# Patient Record
Sex: Male | Born: 1965 | Race: White | Hispanic: No | Smoking: Never smoker
Health system: Southern US, Community
[De-identification: ages and names within clinical notes are randomized; demographics above are authoritative.]

## PROBLEM LIST (undated history)

## (undated) DIAGNOSIS — E291 Testicular hypofunction: Secondary | ICD-10-CM

## (undated) DIAGNOSIS — Z79899 Other long term (current) drug therapy: Secondary | ICD-10-CM

## (undated) DIAGNOSIS — E785 Hyperlipidemia, unspecified: Secondary | ICD-10-CM

## (undated) DIAGNOSIS — E559 Vitamin D deficiency, unspecified: Secondary | ICD-10-CM

## (undated) DIAGNOSIS — I10 Essential (primary) hypertension: Secondary | ICD-10-CM

## (undated) DIAGNOSIS — F419 Anxiety disorder, unspecified: Secondary | ICD-10-CM

## (undated) HISTORY — DX: Anxiety disorder, unspecified: F41.9

## (undated) HISTORY — DX: Testicular hypofunction: E29.1

## (undated) HISTORY — DX: Other long term (current) drug therapy: Z79.899

## (undated) HISTORY — DX: Vitamin D deficiency, unspecified: E55.9

## (undated) HISTORY — DX: Hyperlipidemia, unspecified: E78.5

## (undated) HISTORY — PX: HERNIA REPAIR: SHX51

## (undated) HISTORY — PX: VASECTOMY: SHX75

---

## 2004-01-02 HISTORY — PX: LASIK: SHX215

## 2009-09-14 ENCOUNTER — Ambulatory Visit (HOSPITAL_COMMUNITY): Admission: RE | Admit: 2009-09-14 | Discharge: 2009-09-14 | Payer: Self-pay | Admitting: Surgery

## 2010-03-16 LAB — CBC
HCT: 48.3 % (ref 39.0–52.0)
Hemoglobin: 17 g/dL (ref 13.0–17.0)
MCHC: 35.1 g/dL (ref 30.0–36.0)
MCV: 97.8 fL (ref 78.0–100.0)

## 2010-03-16 LAB — COMPREHENSIVE METABOLIC PANEL
ALT: 30 U/L (ref 0–53)
Alkaline Phosphatase: 39 U/L (ref 39–117)
BUN: 7 mg/dL (ref 6–23)
CO2: 28 mEq/L (ref 19–32)
Calcium: 9.5 mg/dL (ref 8.4–10.5)
GFR calc non Af Amer: >60 mL/min (ref >60)
Glucose, Bld: 84 mg/dL (ref 70–99)
Potassium: 4.3 mEq/L (ref 3.5–5.1)
Sodium: 142 mEq/L (ref 135–145)

## 2010-03-16 LAB — DIFFERENTIAL
Basophils Relative: 0 % (ref 0–1)
Eosinophils Absolute: 0.1 10*3/uL (ref 0.0–0.7)
Neutro Abs: 2.6 10*3/uL (ref 1.7–7.7)
Neutrophils Relative %: 54 % (ref 43–77)

## 2010-03-16 LAB — PROTIME-INR
INR: 0.94 (ref 0.00–1.49)
Prothrombin Time: 12.8 seconds (ref 11.6–15.2)

## 2012-06-19 ENCOUNTER — Encounter (HOSPITAL_COMMUNITY)
Admission: EM | Disposition: A | Discharge status: Home / Self Care | Payer: Self-pay | Source: Home / Self Care | Attending: Emergency Medicine

## 2012-06-19 ENCOUNTER — Emergency Department (HOSPITAL_COMMUNITY): Payer: 59

## 2012-06-19 ENCOUNTER — Encounter (HOSPITAL_COMMUNITY): Payer: Self-pay | Admitting: Anesthesiology

## 2012-06-19 ENCOUNTER — Emergency Department (HOSPITAL_COMMUNITY): Payer: 59 | Admitting: Anesthesiology

## 2012-06-19 ENCOUNTER — Encounter (HOSPITAL_COMMUNITY): Payer: Self-pay | Admitting: Emergency Medicine

## 2012-06-19 ENCOUNTER — Emergency Department (HOSPITAL_COMMUNITY)
Admission: EM | Admit: 2012-06-19 | Discharge: 2012-06-19 | Disposition: A | Discharge status: Home / Self Care | Payer: 59 | Attending: Emergency Medicine | Admitting: Emergency Medicine

## 2012-06-19 DIAGNOSIS — S51809A Unspecified open wound of unspecified forearm, initial encounter: Secondary | ICD-10-CM | POA: Diagnosis not present

## 2012-06-19 DIAGNOSIS — W540XXA Bitten by dog, initial encounter: Secondary | ICD-10-CM | POA: Insufficient documentation

## 2012-06-19 DIAGNOSIS — S41151A Open bite of right upper arm, initial encounter: Secondary | ICD-10-CM

## 2012-06-19 DIAGNOSIS — I10 Essential (primary) hypertension: Secondary | ICD-10-CM | POA: Insufficient documentation

## 2012-06-19 DIAGNOSIS — L0390 Cellulitis, unspecified: Secondary | ICD-10-CM

## 2012-06-19 DIAGNOSIS — Y92009 Unspecified place in unspecified non-institutional (private) residence as the place of occurrence of the external cause: Secondary | ICD-10-CM | POA: Insufficient documentation

## 2012-06-19 HISTORY — PX: I & D EXTREMITY: SHX5045

## 2012-06-19 HISTORY — DX: Essential (primary) hypertension: I10

## 2012-06-19 LAB — BASIC METABOLIC PANEL
CO2: 28 mEq/L (ref 19–32)
Calcium: 9.1 mg/dL (ref 8.4–10.5)
Creatinine, Ser: 0.86 mg/dL (ref 0.50–1.35)

## 2012-06-19 LAB — CBC WITH DIFFERENTIAL/PLATELET
Basophils Absolute: 0 10*3/uL (ref 0.0–0.1)
Basophils Relative: 0 % (ref 0–1)
Eosinophils Absolute: 0.1 10*3/uL (ref 0.0–0.7)
Eosinophils Relative: 1 % (ref 0–5)
HCT: 40.3 % (ref 39.0–52.0)
MCH: 34.3 pg — ABNORMAL HIGH (ref 26.0–34.0)
MCHC: 36.7 g/dL — ABNORMAL HIGH (ref 30.0–36.0)
MCV: 93.3 fL (ref 78.0–100.0)
Monocytes Absolute: 0.8 10*3/uL (ref 0.1–1.0)
RDW: 12.7 % (ref 11.5–15.5)

## 2012-06-19 SURGERY — IRRIGATION AND DEBRIDEMENT EXTREMITY
Anesthesia: General | Site: Arm Lower | Laterality: Right | Wound class: Dirty or Infected

## 2012-06-19 MED ORDER — PROPOFOL 10 MG/ML IV BOLUS
INTRAVENOUS | Status: DC | PRN
  Administered 2012-06-19: 100 mg via INTRAVENOUS
  Administered 2012-06-19: 200 mg via INTRAVENOUS

## 2012-06-19 MED ORDER — SODIUM CHLORIDE 0.9 % IV SOLN
3.0000 g | Freq: Once | INTRAVENOUS | Status: DC
  Filled 2012-06-19: qty 3

## 2012-06-19 MED ORDER — OXYCODONE-ACETAMINOPHEN 5-325 MG PO TABS: ORAL_TABLET | ORAL | Status: DC

## 2012-06-19 MED ORDER — ONDANSETRON HCL 4 MG/2ML IJ SOLN: 4.0000 mg | Freq: Once | INTRAMUSCULAR | Status: DC | PRN

## 2012-06-19 MED ORDER — LIDOCAINE HCL (CARDIAC) 20 MG/ML IV SOLN
INTRAVENOUS | Status: DC | PRN
  Administered 2012-06-19: 100 mg via INTRAVENOUS

## 2012-06-19 MED ORDER — BUPIVACAINE HCL (PF) 0.25 % IJ SOLN
INTRAMUSCULAR | Status: DC | PRN
  Administered 2012-06-19: 10 mL

## 2012-06-19 MED ORDER — LABETALOL HCL 5 MG/ML IV SOLN
INTRAVENOUS | Status: DC | PRN
  Administered 2012-06-19 (×2): 5 mg via INTRAVENOUS
  Administered 2012-06-19: 10 mg via INTRAVENOUS

## 2012-06-19 MED ORDER — AMOXICILLIN-POT CLAVULANATE 875-125 MG PO TABS: 1.0000 | ORAL_TABLET | Freq: Two times a day (BID) | ORAL | Status: DC

## 2012-06-19 MED ORDER — HYDROMORPHONE HCL PF 1 MG/ML IJ SOLN: 0.2500 mg | INTRAMUSCULAR | Status: DC | PRN

## 2012-06-19 MED ORDER — SODIUM CHLORIDE 0.9 % IR SOLN
Status: DC | PRN
  Administered 2012-06-19: 3000 mL

## 2012-06-19 MED ORDER — SODIUM CHLORIDE 0.9 % IV SOLN
3.0000 g | INTRAVENOUS | Status: DC | PRN
  Administered 2012-06-19: 3 g via INTRAVENOUS

## 2012-06-19 MED ORDER — ONDANSETRON HCL 4 MG/2ML IJ SOLN
INTRAMUSCULAR | Status: DC | PRN
  Administered 2012-06-19: 4 mg via INTRAVENOUS

## 2012-06-19 MED ORDER — MORPHINE SULFATE 4 MG/ML IJ SOLN
4.0000 mg | Freq: Once | INTRAMUSCULAR | Status: AC
  Administered 2012-06-19: 4 mg via INTRAVENOUS
  Filled 2012-06-19: qty 1

## 2012-06-19 MED ORDER — FENTANYL CITRATE 0.05 MG/ML IJ SOLN
INTRAMUSCULAR | Status: DC | PRN
  Administered 2012-06-19 (×2): 100 ug via INTRAVENOUS

## 2012-06-19 MED ORDER — LACTATED RINGERS IV SOLN
INTRAVENOUS | Status: DC | PRN
  Administered 2012-06-19: 21:00:00 via INTRAVENOUS

## 2012-06-19 MED ORDER — HYDROMORPHONE HCL PF 1 MG/ML IJ SOLN
1.0000 mg | Freq: Once | INTRAMUSCULAR | Status: AC
  Administered 2012-06-19: 1 mg via INTRAVENOUS
  Filled 2012-06-19: qty 1

## 2012-06-19 SURGICAL SUPPLY — 57 items
BANDAGE COBAN STERILE 2 (GAUZE/BANDAGES/DRESSINGS) IMPLANT
BANDAGE CONFORM 2  STR LF (GAUZE/BANDAGES/DRESSINGS) IMPLANT
BANDAGE ELASTIC 3 VELCRO ST LF (GAUZE/BANDAGES/DRESSINGS) ×2 IMPLANT
BANDAGE ELASTIC 4 VELCRO ST LF (GAUZE/BANDAGES/DRESSINGS) ×2 IMPLANT
BANDAGE GAUZE ELAST BULKY 4 IN (GAUZE/BANDAGES/DRESSINGS) ×2 IMPLANT
BNDG COHESIVE 1X5 TAN STRL LF (GAUZE/BANDAGES/DRESSINGS) IMPLANT
BNDG ESMARK 4X9 LF (GAUZE/BANDAGES/DRESSINGS) IMPLANT
CLOTH BEACON ORANGE TIMEOUT ST (SAFETY) ×2 IMPLANT
CORDS BIPOLAR (ELECTRODE) ×2 IMPLANT
COVER SURGICAL LIGHT HANDLE (MISCELLANEOUS) ×2 IMPLANT
DECANTER SPIKE VIAL GLASS SM (MISCELLANEOUS) IMPLANT
DRAIN PENROSE 1/4X12 LTX STRL (WOUND CARE) IMPLANT
DRSG ADAPTIC 3X8 NADH LF (GAUZE/BANDAGES/DRESSINGS) IMPLANT
DRSG EMULSION OIL 3X3 NADH (GAUZE/BANDAGES/DRESSINGS) ×2 IMPLANT
DRSG PAD ABDOMINAL 8X10 ST (GAUZE/BANDAGES/DRESSINGS) ×4 IMPLANT
GAUZE PACKING IODOFORM 1/4X5 (PACKING) ×2 IMPLANT
GAUZE XEROFORM 1X8 LF (GAUZE/BANDAGES/DRESSINGS) ×2 IMPLANT
GLOVE BIO SURGEON STRL SZ7.5 (GLOVE) ×4 IMPLANT
GLOVE BIOGEL PI IND STRL 7.0 (GLOVE) ×1 IMPLANT
GLOVE BIOGEL PI IND STRL 8 (GLOVE) ×1 IMPLANT
GLOVE BIOGEL PI INDICATOR 7.0 (GLOVE) ×1
GLOVE BIOGEL PI INDICATOR 8 (GLOVE) ×1
GLOVE ECLIPSE 6.5 STRL STRAW (GLOVE) ×2 IMPLANT
GOWN BRE IMP PREV XXLGXLNG (GOWN DISPOSABLE) ×2 IMPLANT
GOWN STRL NON-REIN LRG LVL3 (GOWN DISPOSABLE) ×2 IMPLANT
HANDPIECE INTERPULSE COAX TIP (DISPOSABLE) ×1
KIT BASIN OR (CUSTOM PROCEDURE TRAY) ×2 IMPLANT
KIT ROOM TURNOVER OR (KITS) ×2 IMPLANT
LOOP VESSEL MAXI BLUE (MISCELLANEOUS) IMPLANT
LOOP VESSEL MINI RED (MISCELLANEOUS) IMPLANT
MANIFOLD NEPTUNE II (INSTRUMENTS) ×2 IMPLANT
NEEDLE HYPO 25X1 1.5 SAFETY (NEEDLE) IMPLANT
NS IRRIG 1000ML POUR BTL (IV SOLUTION) ×2 IMPLANT
PACK ORTHO EXTREMITY (CUSTOM PROCEDURE TRAY) ×2 IMPLANT
PAD ARMBOARD 7.5X6 YLW CONV (MISCELLANEOUS) ×2 IMPLANT
PAD CAST 4YDX4 CTTN HI CHSV (CAST SUPPLIES) ×1 IMPLANT
PADDING CAST COTTON 4X4 STRL (CAST SUPPLIES) ×1
SCRUB BETADINE 4OZ XXX (MISCELLANEOUS) ×2 IMPLANT
SET HNDPC FAN SPRY TIP SCT (DISPOSABLE) ×1 IMPLANT
SOLUTION BETADINE 4OZ (MISCELLANEOUS) ×2 IMPLANT
SPONGE GAUZE 4X4 12PLY (GAUZE/BANDAGES/DRESSINGS) ×2 IMPLANT
SPONGE LAP 18X18 X RAY DECT (DISPOSABLE) ×2 IMPLANT
SPONGE LAP 4X18 X RAY DECT (DISPOSABLE) ×2 IMPLANT
SUCTION FRAZIER TIP 10 FR DISP (SUCTIONS) ×2 IMPLANT
SUT ETHILON 4 0 PS 2 18 (SUTURE) IMPLANT
SUT MON AB 5-0 P3 18 (SUTURE) IMPLANT
SWAB CULTURE LIQUID MINI MALE (MISCELLANEOUS) ×2 IMPLANT
SYR CONTROL 10ML LL (SYRINGE) IMPLANT
TOWEL OR 17X24 6PK STRL BLUE (TOWEL DISPOSABLE) ×2 IMPLANT
TOWEL OR 17X26 10 PK STRL BLUE (TOWEL DISPOSABLE) ×2 IMPLANT
TUBE ANAEROBIC SPECIMEN COL (MISCELLANEOUS) ×2 IMPLANT
TUBE CONNECTING 12X1/4 (SUCTIONS) ×2 IMPLANT
TUBE FEEDING 5FR 15 INCH (TUBING) IMPLANT
TUBING CYSTO DISP (UROLOGICAL SUPPLIES) ×2 IMPLANT
UNDERPAD 30X30 INCONTINENT (UNDERPADS AND DIAPERS) ×2 IMPLANT
WATER STERILE IRR 1000ML POUR (IV SOLUTION) IMPLANT
YANKAUER SUCT BULB TIP NO VENT (SUCTIONS) ×2 IMPLANT

## 2012-06-19 NOTE — H&P (Signed)
Joshua Kerr is an 47 y.o. male.   Chief Complaint: right forearm dog bite HPI: 63 yo rhd male states he was bitten by family dog yesterday.  Has had pain and swelling in right forearm at dog bite since.  Pain with extension of fingers.  Reports no previous injury to right arm and no other injury at this time.  No fevers, chills, night sweats.  Past Medical History  Diagnosis Date  . Hypertension     Past Surgical History  Procedure Laterality Date  . Hernia repair      No family history on file. Social History:  reports that he has never smoked. He does not have any smokeless tobacco history on file. He reports that  drinks alcohol. He reports that he does not use illicit drugs.  Allergies: No Known Allergies   (Not in a hospital admission)  Results for orders placed during the hospital encounter of 06/19/12 (from the past 48 hour(s))  CBC WITH DIFFERENTIAL     Status: Abnormal   Collection Time    06/19/12  5:16 PM      Result Value Range   WBC 8.2  4.0 - 10.5 K/uL   RBC 4.32  4.22 - 5.81 MIL/uL   Hemoglobin 14.8  13.0 - 17.0 g/dL   HCT 45.4  09.8 - 11.9 %   MCV 93.3  78.0 - 100.0 fL   MCH 34.3 (*) 26.0 - 34.0 pg   MCHC 36.7 (*) 30.0 - 36.0 g/dL   RDW 14.7  82.9 - 56.2 %   Platelets 184  150 - 400 K/uL   Neutrophils Relative % 76  43 - 77 %   Neutro Abs 6.2  1.7 - 7.7 K/uL   Lymphocytes Relative 14  12 - 46 %   Lymphs Abs 1.1  0.7 - 4.0 K/uL   Monocytes Relative 10  3 - 12 %   Monocytes Absolute 0.8  0.1 - 1.0 K/uL   Eosinophils Relative 1  0 - 5 %   Eosinophils Absolute 0.1  0.0 - 0.7 K/uL   Basophils Relative 0  0 - 1 %   Basophils Absolute 0.0  0.0 - 0.1 K/uL  BASIC METABOLIC PANEL     Status: Abnormal   Collection Time    06/19/12  5:16 PM      Result Value Range   Sodium 134 (*) 135 - 145 mEq/L   Potassium 4.0  3.5 - 5.1 mEq/L   Chloride 96  96 - 112 mEq/L   CO2 28  19 - 32 mEq/L   Glucose, Bld 99  70 - 99 mg/dL   BUN 9  6 - 23 mg/dL   Creatinine,  Ser 1.30  0.50 - 1.35 mg/dL   Calcium 9.1  8.4 - 86.5 mg/dL   GFR calc non Af Amer >90  >90 mL/min   GFR calc Af Amer >90  >90 mL/min   Comment:            The eGFR has been calculated     using the CKD EPI equation.     This calculation has not been     validated in all clinical     situations.     eGFR's persistently     <90 mL/min signify     possible Chronic Kidney Disease.    [name]  06/19/2012   *RADIOLOGY REPORT*  Clinical Data: Dog bite  RIGHT FOREARM - 2 VIEW  Comparison: None.  Findings: Three views of the right forearm submitted.  No acute fracture or subluxation.  No radiopaque foreign body.  IMPRESSION: No acute fracture or subluxation.   Original Report Authenticated By: Joshua Kerr, M.D.     A comprehensive review of systems was negative.  Blood pressure 147/91, pulse 96, temperature 98.7 F (37.1 C), temperature source Oral, resp. rate 16, SpO2 96.00%.  General appearance: alert, cooperative and appears stated age Head: Normocephalic, without obvious abnormality, atraumatic Neck: supple, symmetrical, trachea midline Resp: clear to auscultation bilaterally Cardio: regular rate and rhythm GI: non tedner Extremities: intact sensation and capillary refill all digits.  +epl/fpl/io.  pain with extension of digits at dorsal dog bite.  wound on dorsum of arm with surrounding erythema and swelling.  volar wound with minimal swelling and less pain.  no proximal streaks. Pulses: 2+ and symmetric Skin: as above Neurologic: Grossly normal Incision/Wound: As above  Assessment/Plan Left forearm dog bite.  Non operative and operative treatment options were discussed with the patient and patient wishes to proceed with operative treatment. Recommend OR for I&D of bite wounds.  Risks, benefits, and alternatives of surgery were discussed and the patient agrees with the plan of care.   [name] R 06/19/2012, 9:03 PM

## 2012-06-19 NOTE — Op Note (Signed)
[id number] 

## 2012-06-19 NOTE — Anesthesia Preprocedure Evaluation (Addendum)
Anesthesia Evaluation  Patient identified by MRN, date of birth, ID band Patient awake    Reviewed: Allergy & Precautions, H&P , NPO status , Patient's Chart, lab work & pertinent test results, reviewed documented beta blocker date and time   Airway Mallampati: I      Dental  (+) Teeth Intact and Dental Advisory Given   Pulmonary          Cardiovascular hypertension, Pt. on medications     Neuro/Psych    GI/Hepatic   Endo/Other    Renal/GU      Musculoskeletal   Abdominal   Peds  Hematology   Anesthesia Other Findings   Reproductive/Obstetrics                          Anesthesia Physical Anesthesia Plan  ASA: II  Anesthesia Plan: General   Post-op Pain Management:    Induction: Intravenous  Airway Management Planned: LMA  Additional Equipment:   Intra-op Plan:   Post-operative Plan: Extubation in OR  Informed Consent: I have reviewed the patients History and Physical, chart, labs and discussed the procedure including the risks, benefits and alternatives for the proposed anesthesia with the patient or authorized representative who has indicated his/her understanding and acceptance.   Dental advisory given  Plan Discussed with: CRNA, Anesthesiologist and Surgeon  Anesthesia Plan Comments:        Anesthesia Quick Evaluation

## 2012-06-19 NOTE — Brief Op Note (Signed)
06/19/2012  10:22 PM  PATIENT:  Joshua Kerr  47 y.o. male  PRE-OPERATIVE DIAGNOSIS:  dogbite  POST-OPERATIVE DIAGNOSIS:  * No post-op diagnosis entered *  PROCEDURE:  Procedure(s): IRRIGATION AND DEBRIDEMENT FOREARM (Right)  SURGEON:  Surgeon(s) and Role:    * Tami Ribas, MD - Primary  PHYSICIAN ASSISTANT:   ASSISTANTS: none   ANESTHESIA:   general  EBL:     BLOOD ADMINISTERED:none  DRAINS: iodoform packing  LOCAL MEDICATIONS USED:  MARCAINE     SPECIMEN:  Source of Specimen:  right forearm  DISPOSITION OF SPECIMEN:  micro  COUNTS:  YES  TOURNIQUET:   Total Tourniquet Time Documented: Upper Arm (Right) - 87 minutes Total: Upper Arm (Right) - 87 minutes   DICTATION: .Other Dictation: Dictation Number [id number]  PLAN OF CARE: Discharge to home after PACU  PATIENT DISPOSITION:  PACU - hemodynamically stable.

## 2012-06-19 NOTE — ED Notes (Signed)
Pt returned from radiology.

## 2012-06-19 NOTE — Preoperative (Signed)
Beta Blockers   Reason not to administer Beta Blockers:Not Applicable. No home beta blockers 

## 2012-06-19 NOTE — Transfer of Care (Signed)
Immediate Anesthesia Transfer of Care Note  Patient: Joshua Kerr  Procedure(s) Performed: Procedure(s): IRRIGATION AND DEBRIDEMENT FOREARM (Right)  Patient Location: PACU  Anesthesia Type:General  Level of Consciousness: sedated  Airway & Oxygen Therapy: Patient Spontanous Breathing and Patient connected to nasal cannula oxygen  Post-op Assessment: Report given to PACU RN and Post -op Vital signs reviewed and stable  Post vital signs: Reviewed and stable  Complications: No apparent anesthesia complications

## 2012-06-19 NOTE — ED Notes (Signed)
Pt here after sustaining a dog bite  To right forearm last night , hand and lower forearm swollen

## 2012-06-19 NOTE — ED Provider Notes (Signed)
History     CSN: [id number]  Arrival date & time 06/19/12  1539   First MD Initiated Contact with Patient 06/19/12 1559      Chief Complaint  Patient presents with  . Animal Bite    (Consider location/radiation/quality/duration/timing/severity/associated sxs/prior treatment) Patient is a 47 y.o. male presenting with animal bite. The history is provided by the patient.  Animal Bite Contact animal:  Dog Location:  Shoulder/arm Shoulder/arm injury location:  R forearm Time since incident:  21 hours Pain details:    Quality:  Aching   Severity:  Moderate   Timing:  Constant   Progression:  Worsening Incident location:  Another residence Provoked: provoked (he was feeding dog left over prime rib. dog started to grown so he "popped him". dog then bit him)   Notifications:  None Animal's rabies vaccination status:  Up to date Relieved by:  Nothing Worsened by:  Nothing tried Ineffective treatments:  None tried Associated symptoms: swelling   Associated symptoms: no fever and no numbness     Past Medical History  Diagnosis Date  . Hypertension     Past Surgical History  Procedure Laterality Date  . Hernia repair      No family history on file.  History  Substance Use Topics  . Smoking status: Never Smoker   . Smokeless tobacco: Not on file  . Alcohol Use: Yes      Review of Systems  Constitutional: Negative for fever and chills.  Respiratory: Negative for chest tightness and shortness of breath.   Cardiovascular: Negative for chest pain.  Gastrointestinal: Negative for nausea, vomiting and abdominal pain.  Genitourinary: Negative for dysuria.  Musculoskeletal: Negative for arthralgias.  Skin: Positive for wound.  Neurological: Negative for numbness.  All other systems reviewed and are negative.    Allergies  Review of patient's allergies indicates no known allergies.  Home Medications  No current outpatient prescriptions on file.  There were no  vitals taken for this visit.  Physical Exam  Nursing note and vitals reviewed. Constitutional: He is oriented to person, place, and time. He appears well-developed and well-nourished. No distress.  HENT:  Head: Normocephalic and atraumatic.  Right Ear: External ear normal.  Left Ear: External ear normal.  Mouth/Throat: Oropharynx is clear and moist.  Eyes: Pupils are equal, round, and reactive to light.  Neck: Normal range of motion. Neck supple.  Cardiovascular: Normal rate, regular rhythm, normal heart sounds and intact distal pulses.  Exam reveals no gallop and no friction rub.   No murmur heard. Pulmonary/Chest: Effort normal and breath sounds normal. No respiratory distress. He has no wheezes. He has no rales.  Abdominal: Soft. There is no tenderness. There is no rebound and no guarding.  Musculoskeletal: Normal range of motion. He exhibits no edema and no tenderness.  Lymphadenopathy:    He has no cervical adenopathy.  Neurological: He is alert and oriented to person, place, and time.  Skin: Skin is warm and dry. No rash noted.  0.5cm puncture wound noted on dorsum of right forearm. 1cm superficial laceration on volar aspect of forearm. Swelling with tenderness and warmth extending to dorsum of hand. Able to flex and extend fingers. No drainage. No fluctuance or induration.   Psychiatric: He has a normal mood and affect. His behavior is normal.    ED Course  Procedures (including critical care time)  Labs Reviewed  CBC WITH DIFFERENTIAL - Abnormal; Notable for the following:    MCH 34.3 (*)  MCHC 36.7 (*)    All other components within normal limits  BASIC METABOLIC PANEL - Abnormal; Notable for the following:    Sodium 134 (*)    All other components within normal limits   [name]  06/19/2012   *RADIOLOGY REPORT*  Clinical Data: Dog bite  RIGHT FOREARM - 2 VIEW  Comparison: None.  Findings: Three views of the right forearm submitted.  No acute fracture or  subluxation.  No radiopaque foreign body.  IMPRESSION: No acute fracture or subluxation.   Original Report Authenticated By: Natasha Mead, M.D.     1. Dog bite of arm, right, initial encounter   2. Cellulitis       MDM  4:00 PM 46yo healthy male here roughly 21hrs s/p dog bite to right forearm. Dog's rabies shots are up to date. Has had progressive swelling and pain. No fever, N/V. One puncture wound noted on dorsum of right mid forearm and small laceration on volar aspect with swelling extending to dorsum of hand. Able to make fist but with some pain. Will obtain xray, cleanse wound.   5:25 PM x-ray negative. Discussed with hand surgeon on call who is in the OR. Enhancers and evaluated patient in the ED. Decision to take patient to the OR this evening for washout. CBC and BMP ordered.       Caren Hazy, MD 06/19/12 2132

## 2012-06-19 NOTE — Anesthesia Postprocedure Evaluation (Signed)
  Anesthesia Post-op Note  Patient: Joshua Kerr  Procedure(s) Performed: Procedure(s): IRRIGATION AND DEBRIDEMENT FOREARM (Right)  Patient Location: PACU  Anesthesia Type:General  Level of Consciousness: awake, alert , oriented and patient cooperative  Airway and Oxygen Therapy: Patient Spontanous Breathing  Post-op Pain: mild  Post-op Assessment: Post-op Vital signs reviewed, Patient's Cardiovascular Status Stable, Respiratory Function Stable, Patent Airway, No signs of Nausea or vomiting and Pain level controlled  Post-op Vital Signs: stable  Complications: No apparent anesthesia complications

## 2012-06-20 ENCOUNTER — Encounter (HOSPITAL_COMMUNITY): Payer: Self-pay | Admitting: Orthopedic Surgery

## 2012-06-20 NOTE — Op Note (Signed)
Joshua Kerr, VONADA NO.:  [id number]  MEDICAL RECORD NO.:  [id number]  LOCATION:  [facility]                         FACILITY:  [facility]  PHYSICIAN:  Betha Loa, MD        DATE OF BIRTH:  [birth year]  DATE OF PROCEDURE:  06/19/2012 DATE OF DISCHARGE:  06/19/2012                              OPERATIVE REPORT   PREOPERATIVE DIAGNOSIS:  Right forearm dog bite.  POSTOP DIAGNOSIS:  Right forearm dog bite.  PROCEDURE:  Irrigation and debridement of right forearm dog bite wounds x2.  SURGEON:  Betha Loa, MD  ASSISTANT:  None.  ANESTHESIA:  General.  IV FLUIDS:  Per Anesthesia flow sheet.  ESTIMATED BLOOD LOSS:  Minimal.  COMPLICATIONS:  None.  SPECIMENS:  None.  TOURNIQUET TIME:  27 minutes.  DISPOSITION:  Stable to PACU.  INDICATIONS:  Mr. Verga is a 47 year old right hand dominant male who states he was bitten by the family dog yesterday.  He has had pain and swelling in the arm, mostly in the dorsum.  Presented to [facility] Emergency Department.  I was consulted for management injury.  On evaluation, he had a swollen erythematous dorsum to the forearm.  He had pain with extension of the fingers.  He also had a volar wound that was not painful.  I recommended to Mr. Cloyd going to the operating room for irrigation and debridement of the dog bite wounds.  Risks, benefits, and alternatives of surgery were discussed including the risk of blood loss, infection, damage to nerves, vessels, tendons, ligaments, bone; failure of surgery; need for additional surgery, complications with wound healing, continued pain, continued infection, need for repeat irrigation and debridement.  He voiced understanding of these risks and elected to proceed.  DESCRIPTION OF PROCEDURE:  After being identified preoperatively by myself, the patient and I agreed upon the procedure and site of procedure.  Surgical site was marked.  Risks, benefits, and alternatives of surgery  were reviewed and he wished to proceed.  Surgical consent had been signed.  He was transferred to the operating room, placed on the operating table in supine position with the right upper extremity on arm board.  General anesthesia was induced by anesthesiologist.  The right upper extremity was prepped and draped in normal sterile orthopedic fashion.  Surgical pause was performed between surgeons, anesthesia, and operating staff, and all were in agreement as to the patient, procedure, and site of procedure.  Tourniquet at the proximal aspect of the Extremity was inflated to 250 mmHg after exsanguination of the hand and proximal forearm with the Esmarch bandage.  The area of the wounds were not exsanguinated.  The volar wound was addressed first.  It was extended both proximally and distally.  There was no gross purulence. The bite could be traced down into the musculature.  There was no gross contamination.  The wound was copiously irrigated with sterile saline. Attention was turned to the dorsal wound.  This was extended both proximally and distally in the traumatic wound edges excised.  The bite went down into the distal musculature of the EDC tendons.  It was debrided.  There was no gross contamination.  There was  more fluid in the dorsal wound.  Cultures were taken for aerobes, anaerobes, AFB, and fungus.  The fascia was divided over the EDC tendons.  The muscle was swollen.  It was not tense.  There was no evidence of compartment syndrome.  The wounds were copiously irrigated with 3000 mL of sterile saline by cysto tubing.  They were then packed open with quarter-inch iodoform gauze.  The wounds were injected with 10 mL of 0.25% plain Marcaine to aid in postoperative analgesia.  A single 4-0 nylon suture was placed in the volar wound to decrease wound suds.  The wound edges were not approximated.  The wounds were then dressed with sterile Xeroform, 4x4s, and wrapped with a Kerlix  bandage.  A volar splint was placed and wrapped with Kerlix and Ace bandage.  Tourniquet was deflated to 27 minutes.  Fingertips were pink with brisk capillary refill after deflation of the tourniquet.  Operative drapes were broken down.  The patient was awoken from anesthesia safely.  He was transferred back to stretcher and taken to PACU in stable condition.  I will see him in the office on Monday for postoperative followup.  I will give him Percocet 5/325, 1-2 p.o. q.6 hours p.r.n. pain, dispensed #30, and Augmentin 875 mg 1 p.o. b.i.d. x7 days.     Betha Loa, MD     KK/MEDQ  D:  06/19/2012  T:  06/20/2012  Job:  [id number]

## 2012-06-22 NOTE — ED Provider Notes (Signed)
I saw and evaluated the patient, reviewed the resident's note and I agree with the findings and plan.  Patient presents with localized area of swelling, redness consistent with infection secondary to dog bite which occurred yesterday. Doctor Merlyn Lot has evaluated the patient and will take him to the OR for drainage.  Gilda Crease, MD 06/22/12 [phone]

## 2012-06-23 LAB — WOUND CULTURE

## 2012-06-24 LAB — ANAEROBIC CULTURE

## 2013-01-04 ENCOUNTER — Encounter: Payer: Self-pay | Admitting: Physician Assistant

## 2013-01-04 DIAGNOSIS — E559 Vitamin D deficiency, unspecified: Secondary | ICD-10-CM | POA: Insufficient documentation

## 2013-01-04 DIAGNOSIS — E291 Testicular hypofunction: Secondary | ICD-10-CM

## 2013-01-04 DIAGNOSIS — F419 Anxiety disorder, unspecified: Secondary | ICD-10-CM

## 2013-01-04 DIAGNOSIS — E782 Mixed hyperlipidemia: Secondary | ICD-10-CM | POA: Insufficient documentation

## 2013-01-04 DIAGNOSIS — E785 Hyperlipidemia, unspecified: Secondary | ICD-10-CM

## 2013-01-04 DIAGNOSIS — I10 Essential (primary) hypertension: Secondary | ICD-10-CM

## 2013-01-04 DIAGNOSIS — Z79899 Other long term (current) drug therapy: Secondary | ICD-10-CM | POA: Insufficient documentation

## 2013-01-07 ENCOUNTER — Ambulatory Visit: Payer: Self-pay | Admitting: Internal Medicine

## 2013-02-26 ENCOUNTER — Encounter: Payer: Self-pay | Admitting: Internal Medicine

## 2013-02-26 DIAGNOSIS — R7309 Other abnormal glucose: Secondary | ICD-10-CM | POA: Insufficient documentation

## 2013-02-26 NOTE — Progress Notes (Signed)
Patient ID: Joshua Kerr, male   DOB: 11/10/1965, 48 y.o.   MRN: [id number]    This very nice 48 y.o. DWM presents for 3 month follow up with Hypertension, Hyperlipidemia, Pre-Diabetes and Vitamin D Deficiency. Today he presents with a several day Hx/o severe myalgias with occasional dry nonproductive cough.    HTN predates since 2006 with treatment started in Feb 2010. BP has been controlled at home. Today's BP: 132/90 mmHg . Patient denies any cardiac type chest pain, palpitations, dyspnea/orthopnea/PND, dizziness, claudication, or dependent edema. He does exercise 5 x week.    Hyperlipidemia is controlled with diet & meds. Today's Lipids as below show good response at goal. Patient denies myalgias or other med SE's.  Lab Results  Component Value Date   CHOL 146 02/27/2013   HDL 63 02/27/2013   LDLCALC 68 02/27/2013   TRIG 74 02/27/2013   CHOLHDL 2.3 02/27/2013    Also, the patient is screened for PreDiabetes/insulin resistance with last A1c of 5.6% in Oct 2014. Patient denies any symptoms of reactive hypoglycemia, diabetic polys, paresthesias or visual blurring.   Further, Patient has history of Vitamin D Deficiency of 256 in 2008 with last vitamin D of 74 in Oct 2014. Patient supplements vitamin D without any suspected side-effects.    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  Medication List       ALPRAZolam 1 MG tablet  Commonly known as:  XANAX  Take 1 mg by mouth at bedtime.     atorvastatin 80 MG tablet  Commonly known as:  LIPITOR     azithromycin 250 MG tablet  Commonly known as:  ZITHROMAX  Take 2 tablets (500 mg) on  Day 1,  followed by 1 tablet (250 mg) once daily on Days 2 through 5.     enalapril 20 MG tablet  Commonly known as:  VASOTEC  Take 20 mg by mouth daily.     HYDROcodone-acetaminophen 5-325 MG per tablet  Commonly  known as:  NORCO  Take 1 tablet by mouth every 6 (six) hours as needed for moderate pain.     predniSONE 20 MG tablet  Commonly known as:  DELTASONE  Take 1 tablet (20 mg total) by mouth See admin instructions. 1 tab 3 x day for 3 days, then 1 tab 2 x day for 3 days, then 1 tab 1 x day for 5 days     rosuvastatin 20 MG tablet  Commonly known as:  CRESTOR  Take 20 mg by mouth daily.     VITAMIN D PO  Take 5,000 Units by mouth daily.       Allergies  Allergen Reactions  . Citalopram     dyphoria    PMHx:   Past Medical History  Diagnosis Date  . Hypertension   . Anxiety   . Hyperlipidemia   . Hypogonadism male   . Vitamin D deficiency   . Encounter for long-term (current) use of other medications     FHx:    Reviewed / unchanged  SHx:    Reviewed / unchanged  Systems Review: Constitutional: Denies chills, wt changes, headaches, insomnia, fatigue, night sweats, change in appetite. Eyes: Denies redness, blurred vision, diplopia, discharge, itchy, watery eyes.  ENT: Denies discharge, congestion, post nasal drip, epistaxis, sore throat, earache, hearing loss, dental pain, tinnitus, vertigo, sinus  pain, snoring.  CV: Denies chest pain, palpitations, irregular heartbeat, syncope, dyspnea, diaphoresis, orthopnea, PND, claudication, edema. Respiratory: denies cough, dyspnea, DOE, pleurisy, hoarseness, laryngitis, wheezing.  Gastrointestinal: Denies dysphagia, odynophagia, heartburn, reflux, water brash, abdominal pain or cramps, nausea, vomiting, bloating, diarrhea, constipation, hematemesis, melena, hematochezia,  or hemorrhoids. Genitourinary: Denies dysuria, frequency, urgency, nocturia, hesitancy, discharge, hematuria, flank pain. Musculoskeletal: Denies arthralgias,stiffness, jt. swelling, pain, limp, strain/sprain.  Skin: Denies pruritus, rash, hives, warts, acne, eczema, change in skin lesion(s). Neuro: No weakness, tremor, incoordination, spasms, paresthesia, or  pain. Psychiatric: Denies confusion, memory loss, or sensory loss. Denies DEpression Endo: Denies change in weight, skin, hair change.  Heme/Lymph: No excessive bleeding, bruising, orenlarged lymph nodes.  BP: 132/90  Pulse: 92  Temp: 99.1 F (37.3 C)  Resp: 16    Estimated body mass index is 28.88 kg/(m^2) as calculated from the following:   Height as of this encounter: 5\' 11"  (1.803 m).   Weight as of this encounter: 207 lb (93.895 kg).  On Exam: Appears well nourished - in no distress. Eyes: PERRLA, EOMs, conjunctiva no swelling or erythema. Sinuses: No frontal/maxillary tenderness ENT/Mouth: EAC's clear, TM's nl w/o erythema, bulging. Nares clear w/o erythema, swelling, exudates. Oropharynx clear without erythema or exudates. Oral hygiene is good. Tongue normal, non obstructing. Hearing intact.  Neck: Supple. Thyroid nl. Car 2+/2+ without bruits, nodes or JVD. Chest: Respirations nl with BS clear & equal w/o rales, rhonchi, wheezing or stridor.  Cor: Heart sounds normal w/ regular rate and rhythm without sig. murmurs, gallops, clicks, or rubs. Peripheral pulses normal and equal  without edema.  Abdomen: Soft & bowel sounds normal. Non-tender w/o guarding, rebound, hernias, masses, or organomegaly.  Lymphatics: Unremarkable.  Musculoskeletal: Full ROM all peripheral extremities, joint stability, 5/5 strength, and normal gait.  Skin: Warm, dry without exposed rashes, lesions, ecchymosis apparent.  Neuro: Cranial nerves intact, reflexes equal bilaterally. Sensory-motor testing grossly intact. Tendon reflexes grossly intact.  Pysch: Alert & oriented x 3. Insight and judgement nl & appropriate. No ideations.  Assessment and Plan:  1. Hypertension - Continue monitor blood pressure at home. Continue diet/meds same.  2. Hyperlipidemia - Continue diet/meds, exercise,& lifestyle modifications. Continue monitor periodic cholesterol/liver & renal functions   3. Pre-diabetes/Insulin  Resistance - Continue diet, exercise, lifestyle modifications. Monitor appropriate labs.  4. Vitamin D Deficiency - Continue supplementation.  5. Fever/Myalgias - prob Viral Illness - Rapid Flu Test is neg. Will cover for secondary bacterial infectio with z Pak, Prednisone and Norco 5 (with Precautions to avoid with alcohol.  Recommended regular exercise, BP monitoring, weight control, and discussed med and SE's. Recommended labs to assess and monitor clinical status. Further disposition pending results of labs. Long discussion on the perils and negatoive health consequences of alcoholism.

## 2013-02-26 NOTE — Patient Instructions (Signed)

## 2013-02-27 ENCOUNTER — Ambulatory Visit (INDEPENDENT_AMBULATORY_CARE_PROVIDER_SITE_OTHER): Payer: 59 | Admitting: Internal Medicine

## 2013-02-27 ENCOUNTER — Encounter: Payer: Self-pay | Admitting: Internal Medicine

## 2013-02-27 VITALS — BP 132/90 | HR 92 | Temp 99.1°F | Resp 16 | Ht 71.0 in | Wt 207.0 lb

## 2013-02-27 DIAGNOSIS — E559 Vitamin D deficiency, unspecified: Secondary | ICD-10-CM

## 2013-02-27 DIAGNOSIS — E782 Mixed hyperlipidemia: Secondary | ICD-10-CM

## 2013-02-27 DIAGNOSIS — E785 Hyperlipidemia, unspecified: Secondary | ICD-10-CM

## 2013-02-27 DIAGNOSIS — I10 Essential (primary) hypertension: Secondary | ICD-10-CM

## 2013-02-27 DIAGNOSIS — R7309 Other abnormal glucose: Secondary | ICD-10-CM

## 2013-02-27 DIAGNOSIS — Z79899 Other long term (current) drug therapy: Secondary | ICD-10-CM

## 2013-02-27 DIAGNOSIS — R509 Fever, unspecified: Secondary | ICD-10-CM

## 2013-02-27 LAB — BASIC METABOLIC PANEL WITH GFR
BUN: 12 mg/dL (ref 6–23)
CALCIUM: 9.6 mg/dL (ref 8.4–10.5)
CO2: 25 meq/L (ref 19–32)
CREATININE: 1.07 mg/dL (ref 0.50–1.35)
Chloride: 102 mEq/L (ref 96–112)
GFR, Est African American: >89 mL/min
GFR, Est Non African American: 83 mL/min
GLUCOSE: 115 mg/dL — AB (ref 70–99)
Potassium: 4.4 mEq/L (ref 3.5–5.3)
SODIUM: 139 meq/L (ref 135–145)

## 2013-02-27 LAB — HEPATIC FUNCTION PANEL
ALK PHOS: 55 U/L (ref 39–117)
ALT: 27 U/L (ref 0–53)
AST: 17 U/L (ref 0–37)
Albumin: 4.4 g/dL (ref 3.5–5.2)
BILIRUBIN DIRECT: 0.2 mg/dL (ref 0.0–0.3)
BILIRUBIN TOTAL: 0.6 mg/dL (ref 0.2–1.2)
Indirect Bilirubin: 0.4 mg/dL (ref 0.2–1.2)
Total Protein: 7 g/dL (ref 6.0–8.3)

## 2013-02-27 LAB — CBC WITH DIFFERENTIAL/PLATELET
Basophils Absolute: 0 10*3/uL (ref 0.0–0.1)
Basophils Relative: 0 % (ref 0–1)
Eosinophils Absolute: 0.1 10*3/uL (ref 0.0–0.7)
Eosinophils Relative: 1 % (ref 0–5)
HEMATOCRIT: 43.6 % (ref 39.0–52.0)
HEMOGLOBIN: 15.8 g/dL (ref 13.0–17.0)
LYMPHS PCT: 9 % — AB (ref 12–46)
Lymphs Abs: 0.8 10*3/uL (ref 0.7–4.0)
MCH: 33.5 pg (ref 26.0–34.0)
MCHC: 36.2 g/dL — ABNORMAL HIGH (ref 30.0–36.0)
MCV: 92.6 fL (ref 78.0–100.0)
MONO ABS: 0.9 10*3/uL (ref 0.1–1.0)
MONOS PCT: 10 % (ref 3–12)
NEUTROS ABS: 6.9 10*3/uL (ref 1.7–7.7)
Neutrophils Relative %: 80 % — ABNORMAL HIGH (ref 43–77)
Platelets: 165 10*3/uL (ref 150–400)
RBC: 4.71 MIL/uL (ref 4.22–5.81)
RDW: 13.1 % (ref 11.5–15.5)
WBC: 8.6 10*3/uL (ref 4.0–10.5)

## 2013-02-27 LAB — LIPID PANEL
Cholesterol: 146 mg/dL (ref 0–200)
HDL: 63 mg/dL (ref >39)
LDL CALC: 68 mg/dL (ref 0–99)
TRIGLYCERIDES: 74 mg/dL (ref <150)
Total CHOL/HDL Ratio: 2.3 Ratio
VLDL: 15 mg/dL (ref 0–40)

## 2013-02-27 LAB — TSH: TSH: 0.584 u[IU]/mL (ref 0.350–4.500)

## 2013-02-27 LAB — MAGNESIUM: Magnesium: 1.7 mg/dL (ref 1.5–2.5)

## 2013-02-27 LAB — HEMOGLOBIN A1C
Hgb A1c MFr Bld: 5.2 % (ref <5.7)
MEAN PLASMA GLUCOSE: 103 mg/dL (ref <117)

## 2013-02-27 MED ORDER — AZITHROMYCIN 250 MG PO TABS: 500.0000 mg | ORAL_TABLET | Freq: Every day | ORAL | Status: DC

## 2013-02-27 MED ORDER — HYDROCODONE-ACETAMINOPHEN 5-325 MG PO TABS: 1.0000 | ORAL_TABLET | Freq: Four times a day (QID) | ORAL | Status: DC | PRN

## 2013-02-27 MED ORDER — PREDNISONE 20 MG PO TABS: 20.0000 mg | ORAL_TABLET | ORAL | Status: DC

## 2013-02-27 MED ORDER — AZITHROMYCIN 250 MG PO TABS: ORAL_TABLET | ORAL | Status: DC

## 2013-02-28 LAB — INSULIN, FASTING: Insulin fasting, serum: 20 u[IU]/mL (ref 3–28)

## 2013-02-28 LAB — VITAMIN D 25 HYDROXY (VIT D DEFICIENCY, FRACTURES): Vit D, 25-Hydroxy: 103 ng/mL — ABNORMAL HIGH (ref 30–89)

## 2013-03-04 ENCOUNTER — Telehealth: Payer: Self-pay | Admitting: *Deleted

## 2013-03-04 ENCOUNTER — Other Ambulatory Visit: Payer: Self-pay | Admitting: Internal Medicine

## 2013-03-04 MED ORDER — LEVOFLOXACIN 500 MG PO TABS: 500.0000 mg | ORAL_TABLET | Freq: Every day | ORAL | Status: AC

## 2013-03-04 NOTE — Telephone Encounter (Signed)
Patient called.  He has finished antibiotic for URI and still sick.  RX for Levaquin sent to [facility] by Dr Melford Aase.

## 2013-04-12 ENCOUNTER — Other Ambulatory Visit: Payer: Self-pay | Admitting: Internal Medicine

## 2013-06-01 ENCOUNTER — Encounter: Payer: Self-pay | Admitting: Emergency Medicine

## 2013-06-01 ENCOUNTER — Ambulatory Visit (INDEPENDENT_AMBULATORY_CARE_PROVIDER_SITE_OTHER): Payer: 59 | Admitting: Emergency Medicine

## 2013-06-01 VITALS — BP 120/70 | HR 100 | Temp 98.6°F | Resp 18 | Ht 71.0 in | Wt 200.0 lb

## 2013-06-01 DIAGNOSIS — E782 Mixed hyperlipidemia: Secondary | ICD-10-CM

## 2013-06-01 DIAGNOSIS — I10 Essential (primary) hypertension: Secondary | ICD-10-CM

## 2013-06-01 LAB — BASIC METABOLIC PANEL WITH GFR
BUN: 19 mg/dL (ref 6–23)
CHLORIDE: 99 meq/L (ref 96–112)
CO2: 27 mEq/L (ref 19–32)
Calcium: 10.1 mg/dL (ref 8.4–10.5)
Creat: 1.16 mg/dL (ref 0.50–1.35)
GFR, EST NON AFRICAN AMERICAN: 76 mL/min
GFR, Est African American: 87 mL/min
Glucose, Bld: 107 mg/dL — ABNORMAL HIGH (ref 70–99)
POTASSIUM: 4.5 meq/L (ref 3.5–5.3)
SODIUM: 137 meq/L (ref 135–145)

## 2013-06-01 LAB — HEPATIC FUNCTION PANEL
ALK PHOS: 49 U/L (ref 39–117)
ALT: 37 U/L (ref 0–53)
AST: 25 U/L (ref 0–37)
Albumin: 4.8 g/dL (ref 3.5–5.2)
BILIRUBIN DIRECT: 0.2 mg/dL (ref 0.0–0.3)
Indirect Bilirubin: 0.6 mg/dL (ref 0.2–1.2)
Total Bilirubin: 0.8 mg/dL (ref 0.2–1.2)
Total Protein: 7.6 g/dL (ref 6.0–8.3)

## 2013-06-01 LAB — LIPID PANEL
CHOLESTEROL: 186 mg/dL (ref 0–200)
HDL: 66 mg/dL (ref >39)
LDL CALC: 92 mg/dL (ref 0–99)
Total CHOL/HDL Ratio: 2.8 Ratio
Triglycerides: 138 mg/dL (ref <150)
VLDL: 28 mg/dL (ref 0–40)

## 2013-06-01 NOTE — Patient Instructions (Signed)

## 2013-06-01 NOTE — Progress Notes (Signed)
Subjective:    Patient ID: Joshua Kerr, male    DOB: Sep 11, 1965, 48 y.o.   MRN: [id number]  HPI Comments: 48 yo WM presents for 3 month F/U for HTN, Cholesterol, Pre-Dm, D. deficient . He notes he is eating healthy and exercises routinely. He is still drinking but not as much. He has been eating out more. He notes BP is good.  WBC             8.6   02/27/2013 HGB            15.8   02/27/2013 HCT            43.6   02/27/2013 PLT             165   02/27/2013 GLUCOSE         115   02/27/2013 CHOL            146   02/27/2013 TRIG             74   02/27/2013 HDL              63   02/27/2013 LDLCALC          68   02/27/2013 ALT              27   02/27/2013 AST              17   02/27/2013 NA              139   02/27/2013 K               4.4   02/27/2013 CL              102   02/27/2013 CREATININE     1.07   02/27/2013 BUN              12   02/27/2013 CO2              25   02/27/2013 TSH           0.584   02/27/2013 INR            0.94   09/09/2009 HGBA1C          5.2   02/27/2013   Hyperlipidemia      Medication List       This list is accurate as of: 06/01/13  9:06 AM.  Always use your most recent med list.               ALPRAZolam 1 MG tablet  Commonly known as:  XANAX  Take 1 mg by mouth at bedtime.     atorvastatin 80 MG tablet  Commonly known as:  LIPITOR  TAKE 1 TABLET AT BEDTIME   FOR CHOLESTEROL     enalapril 20 MG tablet  Commonly known as:  VASOTEC  Take 20 mg by mouth daily.     VITAMIN D PO  Take 5,000 Units by mouth daily.       Allergies  Allergen Reactions  . Citalopram     dyphoria   Past Medical History  Diagnosis Date  . Hypertension   . Anxiety   . Hyperlipidemia   . Hypogonadism male   . Vitamin D deficiency   . Encounter for long-term (current) use of other medications   . Alcoholism      Review of Systems  All other systems reviewed and are negative.  BP 120/70  Pulse 100  Temp(Src) 98.6 F (37 C) (Temporal)  Resp 18  Ht 5\' 11"  (1.803 m)   Wt 200 lb (90.719 kg)  BMI 27.91 kg/m2     Objective:   Physical Exam  Nursing note and vitals reviewed. Constitutional: He is oriented to person, place, and time. He appears well-developed and well-nourished.  HENT:  Head: Normocephalic and atraumatic.  Right Ear: External ear normal.  Left Ear: External ear normal.  Nose: Nose normal.  Mouth/Throat: Oropharynx is clear and moist. No oropharyngeal exudate.  Eyes: Conjunctivae are normal.  Neck: Normal range of motion.  Cardiovascular: Normal rate, regular rhythm, normal heart sounds and intact distal pulses.   Pulmonary/Chest: Effort normal and breath sounds normal.  Abdominal: Soft.  Musculoskeletal: Normal range of motion.  Lymphadenopathy:    He has no cervical adenopathy.  Neurological: He is alert and oriented to person, place, and time.  Skin: Skin is warm and dry.  Psychiatric: He has a normal mood and affect. Judgment normal.          Assessment & Plan:  1.  3 month F/U for HTN, Cholesterol. Needs healthy diet, cardio QD and obtain healthy weight. Check Labs, Check BP if >130/80 call office

## 2013-06-02 LAB — CBC WITH DIFFERENTIAL/PLATELET
Basophils Absolute: 0 10*3/uL (ref 0.0–0.1)
Basophils Relative: 0 % (ref 0–1)
Eosinophils Absolute: 0.1 10*3/uL (ref 0.0–0.7)
Eosinophils Relative: 2 % (ref 0–5)
HEMATOCRIT: 45.8 % (ref 39.0–52.0)
HEMOGLOBIN: 16.9 g/dL (ref 13.0–17.0)
LYMPHS ABS: 1.7 10*3/uL (ref 0.7–4.0)
LYMPHS PCT: 29 % (ref 12–46)
MCH: 34.3 pg — ABNORMAL HIGH (ref 26.0–34.0)
MCHC: 36.9 g/dL — ABNORMAL HIGH (ref 30.0–36.0)
MCV: 93.1 fL (ref 78.0–100.0)
MONO ABS: 0.5 10*3/uL (ref 0.1–1.0)
MONOS PCT: 8 % (ref 3–12)
NEUTROS ABS: 3.5 10*3/uL (ref 1.7–7.7)
NEUTROS PCT: 61 % (ref 43–77)
Platelets: 251 10*3/uL (ref 150–400)
RBC: 4.92 MIL/uL (ref 4.22–5.81)
RDW: 14 % (ref 11.5–15.5)
WBC: 5.8 10*3/uL (ref 4.0–10.5)

## 2013-07-01 ENCOUNTER — Encounter: Payer: Self-pay | Admitting: Internal Medicine

## 2013-07-08 ENCOUNTER — Other Ambulatory Visit: Payer: Self-pay | Admitting: Physician Assistant

## 2013-09-09 ENCOUNTER — Encounter: Payer: Self-pay | Admitting: Internal Medicine

## 2013-10-05 ENCOUNTER — Other Ambulatory Visit: Payer: Self-pay | Admitting: Internal Medicine

## 2013-10-05 MED ORDER — ACYCLOVIR 800 MG PO TABS: ORAL_TABLET | ORAL | Status: DC

## 2013-10-14 ENCOUNTER — Telehealth: Payer: Self-pay | Admitting: Internal Medicine

## 2013-10-14 ENCOUNTER — Other Ambulatory Visit: Payer: Self-pay | Admitting: Physician Assistant

## 2013-10-14 NOTE — Telephone Encounter (Signed)
Patient called today requesting a rush refill on ALPRAZolam (XANAX) 1 MG tablet [[id number] REFILL REQUEST GOING OUT OF TOWN TODAY.     Thank you, Katrina Judeth Horn [facility] [phone] Fax [phone] you, Katrina Judeth Horn [facility] [phone] Fax [phone]

## 2013-10-29 ENCOUNTER — Encounter: Payer: Self-pay | Admitting: Internal Medicine

## 2014-01-10 ENCOUNTER — Other Ambulatory Visit: Payer: Self-pay | Admitting: Physician Assistant

## 2014-01-11 NOTE — Progress Notes (Signed)
Patient ID: Joshua Kerr, male   DOB: 08/18/1965, 49 y.o.   MRN: [id number] Annual Comprehensive Examination  This very nice 49 y.o.DWM presents for complete physical.  Patient has been followed for HTN,  Prediabetes, Hyperlipidemia, Testosterone Pand Vitamin D Deficiency. Patient also has hx/o daily EtOH consumption and relates that he has cut back considerably and limits to 3 x/week.    HTN predates since 2006 . Patient's BP has been controlled at home.Today's BP is 152/100. Patient denies any cardiac symptoms as chest pain, palpitations, shortness of breath, dizziness or ankle swelling.   Patient's hyperlipidemia is controlled with diet and medications. Patient denies myalgias or other medication SE's. Last lipids wereat goal - Total  Chol 186; HDL 66; LDL 92; Trig 138 on 06/01/2013.   Patient is screened for prediabetes and patient denies reactive hypoglycemic symptoms, visual blurring, diabetic polys or paresthesias. Last A1c was  5.2% on  02/27/2013.   Patient has hx/o low Testosterone 117 in 2008 and has been sporadically on & off treatment in the pat and currently is off for lack of self percieved benefit. Finally, patient has history of Vitamin D Deficiency of 25 in 2008 and last vitamin D was 02/27/2013: Vit D, 25-Hydroxy 103 on    Medication Sig  . acyclovir (ZOVIRAX) 800 MG tablet Take 1 tablet daily for fever blisters  . ALPRAZolam (XANAX) 1 MG tablet TAKE 1 TABLET BY MOUTH AT BEDTIME  . atorvastatin (LIPITOR) 80 MG tablet TAKE 1 TABLET AT BEDTIME   FOR CHOLESTEROL  . Cholecalciferol (VITAMIN D PO) Take 5,000 Units by mouth daily.   . enalapril (VASOTEC) 20 MG tablet TAKE 1 TABLET BY MOUTH EVERY DAY FOR BLOOD PRESSURE   Allergies  Allergen Reactions  . Citalopram     dyphoria   Past Medical History  Diagnosis Date  . Hypertension   . Anxiety   . Hyperlipidemia   . Hypogonadism male   . Vitamin D deficiency   . Encounter for long-term (current) use of other medications   .  Alcoholism    Health Maintenance  Topic Date Due  . INFLUENZA VACCINE  08/01/2013  . TETANUS/TDAP  12/06/2015   Immunization History  Administered Date(s) Administered  . Influenza-Unspecified 10/07/2012  . PPD Test 01/12/2014  . Pneumococcal-Unspecified 02/11/2008  . Td 12/05/2005   Past Surgical History  Procedure Laterality Date  . Hernia repair    . I&d extremity Right 06/19/2012    Procedure: IRRIGATION AND DEBRIDEMENT FOREARM;  Surgeon: Tennis Must, MD;  Location: [place];  Service: Orthopedics;  Laterality: Right;  . Lasik  2006  . Vasectomy     Family History  Problem Relation Age of Onset  . Cancer Mother     Stomach, lymphoma, melanoma  . Diabetes Mother   . Heart disease Mother    History   Social History  . Marital Status: Legally Separated    Spouse Name: N/A    Number of Children: N/A  . Years of Education: N/A   Occupational History  . 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   Social History Main Topics  . Smoking status: Never Smoker   . Smokeless tobacco: Never Used  . Alcohol Use: Yes     Comment: Nightly  . Drug Use: No  . Sexual Activity: Yes    ROS Constitutional: Denies fever, chills, weight loss/gain, headaches, insomnia, fatigue, night sweats or change in appetite. Eyes: Denies redness, blurred vision, diplopia, discharge, itchy or watery eyes.  ENT: Denies discharge, congestion, post nasal drip, epistaxis, sore throat, earache, hearing loss, dental pain, Tinnitus, Vertigo, Sinus pain or snoring.  Cardio: Denies chest pain, palpitations, irregular heartbeat, syncope, dyspnea, diaphoresis, orthopnea, PND, claudication or edema Respiratory: denies cough, dyspnea, DOE, pleurisy, hoarseness, laryngitis or wheezing.  Gastrointestinal: Denies dysphagia, heartburn, reflux, water brash, pain, cramps, nausea, vomiting, bloating, diarrhea, constipation, hematemesis, melena, hematochezia, jaundice or hemorrhoids Genitourinary: Denies  dysuria, frequency, urgency, nocturia, hesitancy, discharge, hematuria or flank pain Musculoskeletal: Denies arthralgia, myalgia, stiffness, Jt. Swelling, pain, limp or strain/sprain. Denies Falls. Skin: Denies puritis, rash, hives, warts, acne, eczema or change in skin lesion Neuro: No weakness, tremor, incoordination, spasms, paresthesia or pain Psychiatric: Denies confusion, memory loss or sensory loss. Denies Depression. Endocrine: Denies change in weight, skin, hair change, nocturia, and paresthesia, diabetic polys, visual blurring or hyper / hypo glycemic episodes.  Heme/Lymph: No excessive bleeding, bruising or enlarged lymph nodes.  Physical Exam  BP 152/100   Pulse 88  Temp 98.2 F   Resp 16  Ht 5\' 11"    Wt 205 lb 6.4 oz    BMI 28.66  General Appearance: Well nourished, in no apparent distress. Eyes: PERRLA, EOMs, conjunctiva no swelling or erythema, normal fundi and vessels. Sinuses: No frontal/maxillary tenderness ENT/Mouth: EACs patent / TMs  nl. Nares clear without erythema, swelling, mucoid exudates. Oral hygiene is good. No erythema, swelling, or exudate. Tongue normal, non-obstructing. Tonsils not swollen or erythematous. Hearing normal.  Neck: Supple, thyroid normal. No bruits, nodes or JVD. Respiratory: Respiratory effort normal.  BS equal and clear bilateral without rales, rhonci, wheezing or stridor. Cardio: Heart sounds are normal with regular rate and rhythm and no murmurs, rubs or gallops. Peripheral pulses are normal and equal bilaterally without edema. No aortic or femoral bruits. Chest: symmetric with normal excursions and percussion.  Abdomen: Flat, soft, with bowl sounds. Nontender, no guarding, rebound, hernias, masses, or organomegaly.  Lymphatics: Non tender without lymphadenopathy.  Genitourinary: No hernias.Testes nl. DRE - prostate nl for age - smooth & firm w/o nodules. Musculoskeletal: Full ROM all peripheral extremities, joint stability, 5/5 strength,  and normal gait. Skin: Ruddy facies. Warm and dry without rashes, lesions, cyanosis, clubbing or  ecchymosis.  Neuro: Cranial nerves intact, reflexes equal bilaterally. Normal muscle tone, no cerebellar symptoms. Sensation intact.  Pysch: Awake and oriented X 3 with normal affect, insight and judgment appropriate.   Assessment and Plan   1. Hypertension  2. Hyperlipidemia 3. Pre Diabetes 4. Vitamin D Deficiency   Continue prudent diet as discussed, weight control, New Rx Ziac - #90 - BP monitoring, regular exercise, and medications as discussed.  Discussed med effects and SE's. Routine screening labs and tests as requested with regular follow-up as recommended. Re-iterated importance of limiting EtOH intake.   ROV 3 mo for Lipid/Liver check.

## 2014-01-11 NOTE — Patient Instructions (Signed)

## 2014-01-12 ENCOUNTER — Ambulatory Visit (INDEPENDENT_AMBULATORY_CARE_PROVIDER_SITE_OTHER): Payer: 59 | Admitting: Internal Medicine

## 2014-01-12 ENCOUNTER — Encounter: Payer: Self-pay | Admitting: Internal Medicine

## 2014-01-12 VITALS — BP 152/100 | HR 88 | Temp 98.2°F | Resp 16 | Ht 71.0 in | Wt 205.4 lb

## 2014-01-12 DIAGNOSIS — E785 Hyperlipidemia, unspecified: Secondary | ICD-10-CM

## 2014-01-12 DIAGNOSIS — R5383 Other fatigue: Secondary | ICD-10-CM

## 2014-01-12 DIAGNOSIS — J3089 Other allergic rhinitis: Secondary | ICD-10-CM

## 2014-01-12 DIAGNOSIS — Z1212 Encounter for screening for malignant neoplasm of rectum: Secondary | ICD-10-CM

## 2014-01-12 DIAGNOSIS — Z79899 Other long term (current) drug therapy: Secondary | ICD-10-CM

## 2014-01-12 DIAGNOSIS — Z111 Encounter for screening for respiratory tuberculosis: Secondary | ICD-10-CM

## 2014-01-12 DIAGNOSIS — E559 Vitamin D deficiency, unspecified: Secondary | ICD-10-CM

## 2014-01-12 DIAGNOSIS — E291 Testicular hypofunction: Secondary | ICD-10-CM

## 2014-01-12 DIAGNOSIS — Z125 Encounter for screening for malignant neoplasm of prostate: Secondary | ICD-10-CM

## 2014-01-12 DIAGNOSIS — I10 Essential (primary) hypertension: Secondary | ICD-10-CM

## 2014-01-12 DIAGNOSIS — R7309 Other abnormal glucose: Secondary | ICD-10-CM

## 2014-01-12 LAB — HEMOGLOBIN A1C
HEMOGLOBIN A1C: 5.3 % (ref <5.7)
MEAN PLASMA GLUCOSE: 105 mg/dL (ref <117)

## 2014-01-12 MED ORDER — BISOPROLOL-HYDROCHLOROTHIAZIDE 5-6.25 MG PO TABS: 1.0000 | ORAL_TABLET | Freq: Every day | ORAL | Status: DC

## 2014-01-12 MED ORDER — LORATADINE-PSEUDOEPHEDRINE ER 5-120 MG PO TB12: 1.0000 | ORAL_TABLET | Freq: Two times a day (BID) | ORAL | Status: DC

## 2014-01-12 MED ORDER — ALPRAZOLAM 1 MG PO TABS: ORAL_TABLET | ORAL | Status: DC

## 2014-01-12 MED ORDER — LORATADINE-PSEUDOEPHEDRINE ER 10-240 MG PO TB24: 1.0000 | ORAL_TABLET | Freq: Every day | ORAL | Status: AC

## 2014-01-13 LAB — URINALYSIS, MICROSCOPIC ONLY
Bacteria, UA: NONE SEEN
Casts: NONE SEEN
Crystals: NONE SEEN
Squamous Epithelial / LPF: NONE SEEN

## 2014-01-13 LAB — INSULIN, FASTING: Insulin fasting, serum: 8.3 u[IU]/mL (ref 2.0–19.6)

## 2014-01-13 LAB — HEPATIC FUNCTION PANEL
ALBUMIN: 4 g/dL (ref 3.5–5.2)
ALT: 30 U/L (ref 0–53)
AST: 25 U/L (ref 0–37)
Alkaline Phosphatase: 46 U/L (ref 39–117)
Bilirubin, Direct: 0.2 mg/dL (ref 0.0–0.3)
Indirect Bilirubin: 0.6 mg/dL (ref 0.2–1.2)
Total Bilirubin: 0.8 mg/dL (ref 0.2–1.2)
Total Protein: 6.7 g/dL (ref 6.0–8.3)

## 2014-01-13 LAB — BASIC METABOLIC PANEL WITH GFR
BUN: 11 mg/dL (ref 6–23)
CO2: 29 meq/L (ref 19–32)
Calcium: 9.3 mg/dL (ref 8.4–10.5)
Chloride: 102 mEq/L (ref 96–112)
Creat: 0.95 mg/dL (ref 0.50–1.35)
GFR, Est African American: >89 mL/min
GFR, Est Non African American: >89 mL/min
GLUCOSE: 93 mg/dL (ref 70–99)
POTASSIUM: 4.4 meq/L (ref 3.5–5.3)
SODIUM: 142 meq/L (ref 135–145)

## 2014-01-13 LAB — LIPID PANEL
CHOL/HDL RATIO: 3.4 ratio
Cholesterol: 207 mg/dL — ABNORMAL HIGH (ref 0–200)
HDL: 61 mg/dL (ref >39)
LDL Cholesterol: 114 mg/dL — ABNORMAL HIGH (ref 0–99)
Triglycerides: 158 mg/dL — ABNORMAL HIGH (ref <150)
VLDL: 32 mg/dL (ref 0–40)

## 2014-01-13 LAB — IRON AND TIBC
%SAT: 42 % (ref 20–55)
Iron: 130 ug/dL (ref 42–165)
TIBC: 312 ug/dL (ref 215–435)
UIBC: 182 ug/dL (ref 125–400)

## 2014-01-13 LAB — CBC WITH DIFFERENTIAL/PLATELET
Basophils Absolute: 0 10*3/uL (ref 0.0–0.1)
Basophils Relative: 0 % (ref 0–1)
Eosinophils Absolute: 0.2 10*3/uL (ref 0.0–0.7)
Eosinophils Relative: 4 % (ref 0–5)
HCT: 47.7 % (ref 39.0–52.0)
Hemoglobin: 16.8 g/dL (ref 13.0–17.0)
LYMPHS ABS: 1.6 10*3/uL (ref 0.7–4.0)
LYMPHS PCT: 32 % (ref 12–46)
MCH: 33.6 pg (ref 26.0–34.0)
MCHC: 35.2 g/dL (ref 30.0–36.0)
MCV: 95.4 fL (ref 78.0–100.0)
MONOS PCT: 12 % (ref 3–12)
MPV: 9.7 fL (ref 8.6–12.4)
Monocytes Absolute: 0.6 10*3/uL (ref 0.1–1.0)
NEUTROS ABS: 2.6 10*3/uL (ref 1.7–7.7)
NEUTROS PCT: 52 % (ref 43–77)
Platelets: 213 10*3/uL (ref 150–400)
RBC: 5 MIL/uL (ref 4.22–5.81)
RDW: 13.5 % (ref 11.5–15.5)
WBC: 5 10*3/uL (ref 4.0–10.5)

## 2014-01-13 LAB — VITAMIN D 25 HYDROXY (VIT D DEFICIENCY, FRACTURES): Vit D, 25-Hydroxy: 56 ng/mL (ref 30–100)

## 2014-01-13 LAB — MICROALBUMIN / CREATININE URINE RATIO
CREATININE, URINE: 108.2 mg/dL
MICROALB UR: 63.3 mg/dL — AB (ref <2.0)
MICROALB/CREAT RATIO: 585 mg/g — AB (ref 0.0–30.0)

## 2014-01-13 LAB — PSA: PSA: 1.22 ng/mL (ref <=4.00)

## 2014-01-13 LAB — TSH: TSH: 1.837 u[IU]/mL (ref 0.350–4.500)

## 2014-01-13 LAB — VITAMIN B12: Vitamin B-12: 421 pg/mL (ref 211–911)

## 2014-01-13 LAB — MAGNESIUM: Magnesium: 1.8 mg/dL (ref 1.5–2.5)

## 2014-01-13 LAB — TESTOSTERONE: TESTOSTERONE: 343 ng/dL (ref 300–890)

## 2014-01-14 LAB — TB SKIN TEST
INDURATION: 0 mm
TB SKIN TEST: NEGATIVE

## 2014-03-01 ENCOUNTER — Other Ambulatory Visit: Payer: Self-pay | Admitting: Internal Medicine

## 2014-03-25 ENCOUNTER — Other Ambulatory Visit: Payer: Self-pay | Admitting: Internal Medicine

## 2014-03-25 DIAGNOSIS — F411 Generalized anxiety disorder: Secondary | ICD-10-CM

## 2014-03-25 MED ORDER — LORAZEPAM 1 MG PO TABS: ORAL_TABLET | ORAL | Status: AC

## 2014-03-29 ENCOUNTER — Other Ambulatory Visit: Payer: Self-pay | Admitting: Internal Medicine

## 2014-05-03 ENCOUNTER — Other Ambulatory Visit: Payer: Self-pay | Admitting: Internal Medicine

## 2014-05-05 ENCOUNTER — Encounter: Payer: Self-pay | Admitting: Physician Assistant

## 2014-05-05 ENCOUNTER — Ambulatory Visit (INDEPENDENT_AMBULATORY_CARE_PROVIDER_SITE_OTHER): Payer: 59 | Admitting: Physician Assistant

## 2014-05-05 VITALS — BP 132/82 | HR 76 | Temp 97.7°F | Resp 16 | Ht 71.0 in | Wt 207.0 lb

## 2014-05-05 DIAGNOSIS — I10 Essential (primary) hypertension: Secondary | ICD-10-CM

## 2014-05-05 DIAGNOSIS — E291 Testicular hypofunction: Secondary | ICD-10-CM

## 2014-05-05 DIAGNOSIS — R7309 Other abnormal glucose: Secondary | ICD-10-CM

## 2014-05-05 DIAGNOSIS — E785 Hyperlipidemia, unspecified: Secondary | ICD-10-CM

## 2014-05-05 DIAGNOSIS — E559 Vitamin D deficiency, unspecified: Secondary | ICD-10-CM

## 2014-05-05 DIAGNOSIS — Z79899 Other long term (current) drug therapy: Secondary | ICD-10-CM

## 2014-05-05 LAB — BASIC METABOLIC PANEL WITH GFR
BUN: 17 mg/dL (ref 6–23)
CALCIUM: 9 mg/dL (ref 8.4–10.5)
CO2: 27 meq/L (ref 19–32)
Chloride: 105 mEq/L (ref 96–112)
Creat: 1.15 mg/dL (ref 0.50–1.35)
GFR, Est African American: 88 mL/min
GFR, Est Non African American: 76 mL/min
Glucose, Bld: 98 mg/dL (ref 70–99)
POTASSIUM: 4.3 meq/L (ref 3.5–5.3)
SODIUM: 140 meq/L (ref 135–145)

## 2014-05-05 LAB — HEPATIC FUNCTION PANEL
ALBUMIN: 3.7 g/dL (ref 3.5–5.2)
ALT: 34 U/L (ref 0–53)
AST: 30 U/L (ref 0–37)
Alkaline Phosphatase: 35 U/L — ABNORMAL LOW (ref 39–117)
BILIRUBIN TOTAL: 0.4 mg/dL (ref 0.2–1.2)
Bilirubin, Direct: 0.1 mg/dL (ref 0.0–0.3)
Indirect Bilirubin: 0.3 mg/dL (ref 0.2–1.2)
TOTAL PROTEIN: 6.3 g/dL (ref 6.0–8.3)

## 2014-05-05 LAB — CBC WITH DIFFERENTIAL/PLATELET
Basophils Absolute: 0 10*3/uL (ref 0.0–0.1)
Basophils Relative: 0 % (ref 0–1)
Eosinophils Absolute: 0.1 10*3/uL (ref 0.0–0.7)
Eosinophils Relative: 3 % (ref 0–5)
HCT: 43.9 % (ref 39.0–52.0)
Hemoglobin: 15.3 g/dL (ref 13.0–17.0)
LYMPHS PCT: 30 % (ref 12–46)
Lymphs Abs: 1.4 10*3/uL (ref 0.7–4.0)
MCH: 33.6 pg (ref 26.0–34.0)
MCHC: 34.9 g/dL (ref 30.0–36.0)
MCV: 96.5 fL (ref 78.0–100.0)
MPV: 9.7 fL (ref 8.6–12.4)
Monocytes Absolute: 0.5 10*3/uL (ref 0.1–1.0)
Monocytes Relative: 10 % (ref 3–12)
NEUTROS ABS: 2.6 10*3/uL (ref 1.7–7.7)
Neutrophils Relative %: 57 % (ref 43–77)
Platelets: 234 10*3/uL (ref 150–400)
RBC: 4.55 MIL/uL (ref 4.22–5.81)
RDW: 14 % (ref 11.5–15.5)
WBC: 4.5 10*3/uL (ref 4.0–10.5)

## 2014-05-05 LAB — LIPID PANEL
CHOLESTEROL: 215 mg/dL — AB (ref 0–200)
HDL: 68 mg/dL (ref >=40)
LDL Cholesterol: 105 mg/dL — ABNORMAL HIGH (ref 0–99)
Total CHOL/HDL Ratio: 3.2 Ratio
Triglycerides: 211 mg/dL — ABNORMAL HIGH (ref <150)
VLDL: 42 mg/dL — ABNORMAL HIGH (ref 0–40)

## 2014-05-05 LAB — MAGNESIUM: MAGNESIUM: 1.8 mg/dL (ref 1.5–2.5)

## 2014-05-05 LAB — TSH: TSH: 1.914 u[IU]/mL (ref 0.350–4.500)

## 2014-05-05 MED ORDER — HYDROCHLOROTHIAZIDE 12.5 MG PO TABS: 25.0000 mg | ORAL_TABLET | Freq: Every day | ORAL | Status: DC

## 2014-05-05 NOTE — Progress Notes (Signed)
Assessment and Plan:  1. Hypertension -Continue medication, will add on low dose HCTZ, monitor blood pressure at home. Continue DASH diet.  Reminder to go to the ER if any CP, SOB, nausea, dizziness, severe HA, changes vision/speech, left arm numbness and tingling and jaw pain.  2. Cholesterol -Continue diet and exercise. Check cholesterol.   3. Prediabetes  -Continue diet and exercise. Check A1C  4. Vitamin D Def - check level and continue medications.   Continue diet and meds as discussed. Further disposition pending results of labs. Over 30 minutes of exam, counseling, chart review, and critical decision making was performed  HPI 49 y.o. male  presents for 3 month follow up on hypertension, cholesterol, prediabetes, and vitamin D deficiency and has a history of daily alcohol use.    His blood pressure has been controlled at home, he is on enalapril 20mg  and started on ziac last visit but stopped due to weight gain/fatigue,at home will be in the 140'/90's at home, today their BP is BP: 132/82 mmHg  He does workout. He denies chest pain, shortness of breath, dizziness.  He is on cholesterol medication, 1/2 pill every day and denies myalgias. His cholesterol is not at goal. The cholesterol last visit was:   Lab Results  Component Value Date   CHOL 207* 01/12/2014   HDL 61 01/12/2014   LDLCALC 114* 01/12/2014   TRIG 158* 01/12/2014   CHOLHDL 3.4 01/12/2014    He has been working on diet and exercise for prediabetes, and denies paresthesia of the feet, polydipsia, polyuria and visual disturbances. Last A1C in the office was:  Lab Results  Component Value Date   HGBA1C 5.3 01/12/2014  On xanax at night before bed and very rarely during the day if he is doing public speaking.  Patient is on Vitamin D supplement.   Lab Results  Component Value Date   VD25OH 56 01/12/2014      Current Medications:  Current Outpatient Prescriptions on File Prior to Visit  Medication Sig Dispense  Refill  . acyclovir (ZOVIRAX) 800 MG tablet Take 1 tablet daily for fever blisters 90 tablet 1  . ALPRAZolam (XANAX) 1 MG tablet Take 1/2 to 1 tablet 3 x day if needed for anxiety or sleep 270 tablet 1  . atorvastatin (LIPITOR) 80 MG tablet TAKE 1 TABLET AT BEDTIME   FOR CHOLESTEROL 90 tablet 1  . Cholecalciferol (VITAMIN D PO) Take 5,000 Units by mouth daily.     . enalapril (VASOTEC) 20 MG tablet TAKE 1 TABLET BY MOUTH EVERY DAY FOR BLOOD PRESSURE 90 tablet 0  . loratadine-pseudoephedrine (CLARITIN-D 24 HOUR) 10-240 MG per 24 hr tablet Take 1 tablet by mouth daily. 90 tablet 99  . OVER THE COUNTER MEDICATION Claritin-D 24 hour 1 daily.     No current facility-administered medications on file prior to visit.   Medical History:  Past Medical History  Diagnosis Date  . Hypertension   . Anxiety   . Hyperlipidemia   . Hypogonadism male   . Vitamin D deficiency   . Encounter for long-term (current) use of other medications   . Alcoholism    Allergies:  Allergies  Allergen Reactions  . Citalopram     dyphoria     Review of Systems:  Review of Systems  Constitutional: Negative.   HENT: Negative.   Eyes: Negative.   Respiratory: Negative.   Cardiovascular: Positive for leg swelling (occ). Negative for chest pain, palpitations, orthopnea, claudication and PND.  Gastrointestinal: Negative.  Genitourinary: Negative.   Musculoskeletal: Negative.   Skin: Negative.   Neurological: Negative.   Endo/Heme/Allergies: Negative.   Psychiatric/Behavioral: Negative.     Family history- Review and unchanged Social history- Review and unchanged Physical Exam: BP 132/82 mmHg  Pulse 76  Temp(Src) 97.7 F (36.5 C)  Resp 16  Ht 5\' 11"  (1.803 m)  Wt 207 lb (93.895 kg)  BMI 28.88 kg/m2 Wt Readings from Last 3 Encounters:  05/05/14 207 lb (93.895 kg)  01/12/14 205 lb 6.4 oz (93.169 kg)  06/01/13 200 lb (90.719 kg)   General Appearance: Well nourished, in no apparent distress. Eyes:  PERRLA, EOMs, conjunctiva no swelling or erythema Sinuses: No Frontal/maxillary tenderness ENT/Mouth: Ext aud canals clear, TMs without erythema, bulging. No erythema, swelling, or exudate on post pharynx.  Tonsils not swollen or erythematous. Hearing normal.  Neck: Supple, thyroid normal.  Respiratory: Respiratory effort normal, BS equal bilaterally without rales, rhonchi, wheezing or stridor.  Cardio: RRR with no MRGs. Brisk peripheral pulses with very mild edema.  Abdomen: Soft, + BS,  Non tender, no guarding, rebound, hernias, masses. Lymphatics: Non tender without lymphadenopathy.  Musculoskeletal: Full ROM, 5/5 strength, Normal gait Skin: Warm, dry without rashes, lesions, ecchymosis.  Neuro: Cranial nerves intact. Normal muscle tone, no cerebellar symptoms. Psych: Awake and oriented X 3, normal affect, Insight and Judgment appropriate.    Vicie Mutters, PA-C 8:52 AM [facility]

## 2014-05-05 NOTE — Patient Instructions (Signed)
Monitor your blood pressure at home, if it is above 140/90 consistently call the office so we can adjust your medications. Go to the ER if any CP, SOB, nausea, dizziness, severe HA, changes vision/speech  DASH Eating Plan DASH stands for "Dietary Approaches to Stop Hypertension." The DASH eating plan is a healthy eating plan that has been shown to reduce high blood pressure (hypertension). Additional health benefits may include reducing the risk of type 2 diabetes mellitus, heart disease, and stroke. The DASH eating plan may also help with weight loss. WHAT DO I NEED TO KNOW ABOUT THE DASH EATING PLAN? For the DASH eating plan, you will follow these general guidelines:  Choose foods with a percent daily value for sodium of less than 5% (as listed on the food label).  Use salt-free seasonings or herbs instead of table salt or sea salt.  Check with your health care provider or pharmacist before using salt substitutes.  Eat lower-sodium products, often labeled as "lower sodium" or "no salt added."  Eat fresh foods.  Eat more vegetables, fruits, and low-fat dairy products.  Choose whole grains. Look for the word "whole" as the first word in the ingredient list.  Choose fish and skinless chicken or [place] more often than red meat. Limit fish, poultry, and meat to 6 oz (170 g) each day.  Limit sweets, desserts, sugars, and sugary drinks.  Choose heart-healthy fats.  Limit cheese to 1 oz (28 g) per day.  Eat more home-cooked food and less restaurant, buffet, and fast food.  Limit fried foods.  Cook foods using methods other than frying.  Limit canned vegetables. If you do use them, rinse them well to decrease the sodium.  When eating at a restaurant, ask that your food be prepared with less salt, or no salt if possible. WHAT FOODS CAN I EAT? Seek help from a dietitian for individual calorie needs. Grains Whole grain or whole wheat bread. Brown rice. Whole grain or whole wheat pasta.  Quinoa, bulgur, and whole grain cereals. Low-sodium cereals. Corn or whole wheat flour tortillas. Whole grain cornbread. Whole grain crackers. Low-sodium crackers. Vegetables Fresh or frozen vegetables (raw, steamed, roasted, or grilled). Low-sodium or reduced-sodium tomato and vegetable juices. Low-sodium or reduced-sodium tomato sauce and paste. Low-sodium or reduced-sodium canned vegetables.  Fruits All fresh, canned (in natural juice), or frozen fruits. Meat and Other Protein Products Ground beef (85% or leaner), grass-fed beef, or beef trimmed of fat. Skinless chicken or [place]. Ground chicken or [place]. Pork trimmed of fat. All fish and seafood. Eggs. Dried beans, peas, or lentils. Unsalted nuts and seeds. Unsalted canned beans. Dairy Low-fat dairy products, such as skim or 1% milk, 2% or reduced-fat cheeses, low-fat ricotta or cottage cheese, or plain low-fat yogurt. Low-sodium or reduced-sodium cheeses. Fats and Oils Tub margarines without trans fats. Light or reduced-fat mayonnaise and salad dressings (reduced sodium). Avocado. Safflower, olive, or canola oils. Natural peanut or almond butter. Other Unsalted popcorn and pretzels. The items listed above may not be a complete list of recommended foods or beverages. Contact your dietitian for more options. WHAT FOODS ARE NOT RECOMMENDED? Grains White bread. White pasta. White rice. Refined cornbread. Bagels and croissants. Crackers that contain trans fat. Vegetables Creamed or fried vegetables. Vegetables in a cheese sauce. Regular canned vegetables. Regular canned tomato sauce and paste. Regular tomato and vegetable juices. Fruits Dried fruits. Canned fruit in light or heavy syrup. Fruit juice. Meat and Other Protein Products Fatty cuts of meat. Ribs, chicken wings,  bacon, sausage, bologna, salami, chitterlings, fatback, hot dogs, bratwurst, and packaged luncheon meats. Salted nuts and seeds. Canned beans with salt. Dairy Whole or 2%  milk, cream, half-and-half, and cream cheese. Whole-fat or sweetened yogurt. Full-fat cheeses or blue cheese. Nondairy creamers and whipped toppings. Processed cheese, cheese spreads, or cheese curds. Condiments Onion and garlic salt, seasoned salt, table salt, and sea salt. Canned and packaged gravies. Worcestershire sauce. Tartar sauce. Barbecue sauce. Teriyaki sauce. Soy sauce, including reduced sodium. Steak sauce. Fish sauce. Oyster sauce. Cocktail sauce. Horseradish. Ketchup and mustard. Meat flavorings and tenderizers. Bouillon cubes. Hot sauce. Tabasco sauce. Marinades. Taco seasonings. Relishes. Fats and Oils Butter, stick margarine, lard, shortening, ghee, and bacon fat. Coconut, palm kernel, or palm oils. Regular salad dressings. Other Pickles and olives. Salted popcorn and pretzels. The items listed above may not be a complete list of foods and beverages to avoid. Contact your dietitian for more information. WHERE CAN I FIND MORE INFORMATION? National Heart, Lung, and Blood Institute: travelstabloid.com Document Released: 12/07/2010 Document Revised: 05/04/2013 Document Reviewed: 10/22/2012 [facility] Patient Information 2015 [place], [place]. This information is not intended to replace advice given to you by your health care provider. Make sure you discuss any questions you have with your health care provider.

## 2014-05-06 LAB — VITAMIN D 25 HYDROXY (VIT D DEFICIENCY, FRACTURES): Vit D, 25-Hydroxy: 65 ng/mL (ref 30–100)

## 2014-06-09 ENCOUNTER — Other Ambulatory Visit: Payer: Self-pay | Admitting: Internal Medicine

## 2014-07-11 ENCOUNTER — Other Ambulatory Visit: Payer: Self-pay | Admitting: Internal Medicine

## 2014-07-11 DIAGNOSIS — E782 Mixed hyperlipidemia: Secondary | ICD-10-CM

## 2014-07-11 DIAGNOSIS — I10 Essential (primary) hypertension: Secondary | ICD-10-CM

## 2014-07-11 MED ORDER — HYDROCHLOROTHIAZIDE 25 MG PO TABS: ORAL_TABLET | ORAL | Status: DC

## 2014-07-11 MED ORDER — ATORVASTATIN CALCIUM 80 MG PO TABS: ORAL_TABLET | ORAL | Status: DC

## 2014-07-27 ENCOUNTER — Other Ambulatory Visit: Payer: Self-pay | Admitting: Internal Medicine

## 2014-08-09 ENCOUNTER — Ambulatory Visit: Payer: Self-pay | Admitting: Internal Medicine

## 2014-08-20 ENCOUNTER — Other Ambulatory Visit: Payer: Self-pay | Admitting: *Deleted

## 2014-08-20 DIAGNOSIS — I10 Essential (primary) hypertension: Secondary | ICD-10-CM

## 2014-08-20 MED ORDER — HYDROCHLOROTHIAZIDE 25 MG PO TABS: ORAL_TABLET | ORAL | Status: DC

## 2014-08-20 MED ORDER — ENALAPRIL MALEATE 20 MG PO TABS: ORAL_TABLET | ORAL | Status: DC

## 2014-10-09 ENCOUNTER — Encounter: Payer: Self-pay | Admitting: Internal Medicine

## 2014-10-09 DIAGNOSIS — E663 Overweight: Secondary | ICD-10-CM | POA: Insufficient documentation

## 2014-10-09 NOTE — Patient Instructions (Signed)

## 2014-10-09 NOTE — Progress Notes (Signed)
Patient ID: Joshua Kerr, male   DOB: 28-Sep-1965, 49 y.o.   MRN: [id number]     This very nice 49 y.o. sep WM presents for 3 month follow up with Hypertension, Hyperlipidemia, Pre-Diabetes and Vitamin D Deficiency. Patient has hx/o alcoholism as a functional alcoholic - functioning well professionally co-owning a [employer] and not drinking alcohol until all of his work responsibilities are completed each day. He has been repeatedly advised to severely limit and/or decrease/cut out alcohol and attend AA. He rationalizes and minimizes the potential health, social and professional consequences.      Patient is treated for HTN since 2008 & BP has been controlled at home. Today's BP: 106/82 mmHg. Patient has had no complaints of any cardiac type chest pain, palpitations, dyspnea/orthopnea/PND, dizziness, claudication, or dependent edema.     Hyperlipidemia is not controlled with diet & meds. Patient denies myalgias or other med SE's.   Last Lipids were near, but not at goal -   Cholesterol 215*; HDL 68; LDL 105*; Triglycerides 211 on 05/05/2014.      Also, the patient is overweight  And is screened expectantly for PreDiabetes  and has had no symptoms of reactive hypoglycemia, diabetic polys, paresthesias or visual blurring.  Last A1c was 5.3% on 01/12/2014.      Further, the patient also has history of Vitamin D Deficiency of 25 in 2008 and supplements vitamin D without any suspected side-effects. Last vitamin D was 65 on 05/05/2014.     Medication Sig  . acyclovir  800 MG tablet Take 1 tablet daily for fever blisters  . ALPRAZolam 1 MG tablet Take 1/2 to 1 tablet 3 x day if needed for anxiety or sleep  . atorvastatin  80 MG tablet Take 1 tablet daily or as directed for Cholesterol  . VITAMIN D Take 5,000 Units by mouth daily.   . enalapril  20 MG tablet TAKE 1 TABLET BY MOUTH EVERY DAY FOR BLOOD PRESSURE  . hcrtz 25 MG tablet Take 1 tablet daily for BP & Fluid  . CLARITIN-D 24 [id number]   Take 1  tablet by mouth daily.   Allergies  Allergen Reactions  . Citalopram     dyphoria   PMHx:   Past Medical History  Diagnosis Date  . Hypertension   . Anxiety   . Hyperlipidemia   . Hypogonadism male   . Vitamin D deficiency   . Encounter for long-term (current) use of other medications   . Alcoholism ([place])    Immunization History  Administered Date(s) Administered  . Influenza-Unspecified 10/07/2012  . PPD Test 01/12/2014  . Pneumococcal-Unspecified 02/11/2008  . Td 12/05/2005   Past Surgical History  Procedure Laterality Date  . Hernia repair    . I&d extremity Right 06/19/2012    Procedure: IRRIGATION AND DEBRIDEMENT FOREARM;  Surgeon: Tennis Must, MD;  Location: [facility];  Service: Orthopedics;  Laterality: Right;  . Lasik  2006  . Vasectomy     FHx:    Reviewed / unchanged  SHx:    Reviewed / unchanged  Systems Review:  Constitutional: Denies fever, chills, wt changes, headaches, insomnia, fatigue, night sweats, change in appetite. Eyes: Denies redness, blurred vision, diplopia, discharge, itchy, watery eyes.  ENT: Denies discharge, congestion, post nasal drip, epistaxis, sore throat, earache, hearing loss, dental pain, tinnitus, vertigo, sinus pain, snoring.  CV: Denies chest pain, palpitations, irregular heartbeat, syncope, dyspnea, diaphoresis, orthopnea, PND, claudication or edema. Respiratory: denies cough, dyspnea, DOE, pleurisy, hoarseness, laryngitis, wheezing.  Gastrointestinal: Denies dysphagia, odynophagia, heartburn, reflux, water brash, abdominal pain or cramps, nausea, vomiting, bloating, diarrhea, constipation, hematemesis, melena, hematochezia  or hemorrhoids. Genitourinary: Denies dysuria, frequency, urgency, nocturia, hesitancy, discharge, hematuria or flank pain. Musculoskeletal: Denies arthralgias, myalgias, stiffness, jt. swelling, pain, limping or strain/sprain.  Skin: Denies pruritus, rash, hives, warts, acne, eczema or change in skin  lesion(s). Neuro: No weakness, tremor, incoordination, spasms, paresthesia or pain. Psychiatric: Denies confusion, memory loss or sensory loss. Endo: Denies change in weight, skin or hair change.  Heme/Lymph: No excessive bleeding, bruising or enlarged lymph nodes.  Physical Exam  BP 106/82 mmHg  Pulse 88  Temp(Src) 98.1 F (36.7 C)  Resp 16  Ht 5\' 11"  (1.803 m)  Wt 201 lb 6.4 oz (91.354 kg)  BMI 28.10 kg/m2  Appears well nourished and in no distress. Eyes: PERRLA, EOMs, conjunctiva no swelling or erythema. Sinuses: No frontal/maxillary tenderness ENT/Mouth: EAC's clear, TM's nl w/o erythema, bulging. Nares clear w/o erythema, swelling, exudates. Oropharynx clear without erythema or exudates. Oral hygiene is good. Tongue normal, non obstructing. Hearing intact.  Neck: Supple. Thyroid nl. Car 2+/2+ without bruits, nodes or JVD. Chest: Respirations nl with BS clear & equal w/o rales, rhonchi, wheezing or stridor.  Cor: Heart sounds normal w/ regular rate and rhythm without sig. murmurs, gallops, clicks, or rubs. Peripheral pulses normal and equal  without edema.  Abdomen: Soft & bowel sounds normal. Non-tender w/o guarding, rebound, hernias, masses, or organomegaly.  Lymphatics: Unremarkable.  Musculoskeletal: Full ROM all peripheral extremities, joint stability, 5/5 strength, and normal gait.  Skin: Warm, dry without exposed rashes, lesions or ecchymosis apparent.  Neuro: Cranial nerves intact, reflexes equal bilaterally. Sensory-motor testing grossly intact. Tendon reflexes grossly intact.  Pysch: Alert & oriented x 3.  Insight and judgement nl & appropriate. No ideations.  Assessment and Plan:  1. Essential hypertension  - TSH  2. Hyperlipidemia  - Lipid panel  3. Abnormal glucose  - Hemoglobin A1c - Insulin, random  4. Vitamin D deficiency  - Vit D  25 hydroxy   5. Testosterone Deficiency   6. BMI 28.88,  adult   7. Medication management  - CBC with  Differential/Platelet - Hepatic function panel - Magnesium   Recommended regular exercise, BP monitoring, weight control, and discussed med and SE's. Recommended labs to assess and monitor clinical status. Further disposition pending results of labs. Over 30 minutes of exam, counseling, chart review was performed

## 2014-10-11 ENCOUNTER — Encounter: Payer: Self-pay | Admitting: Internal Medicine

## 2014-10-11 ENCOUNTER — Ambulatory Visit (INDEPENDENT_AMBULATORY_CARE_PROVIDER_SITE_OTHER): Payer: 59 | Admitting: Internal Medicine

## 2014-10-11 VITALS — BP 106/82 | HR 88 | Temp 98.1°F | Resp 16 | Ht 71.0 in | Wt 201.4 lb

## 2014-10-11 DIAGNOSIS — E785 Hyperlipidemia, unspecified: Secondary | ICD-10-CM | POA: Diagnosis not present

## 2014-10-11 DIAGNOSIS — R7309 Other abnormal glucose: Secondary | ICD-10-CM

## 2014-10-11 DIAGNOSIS — Z79899 Other long term (current) drug therapy: Secondary | ICD-10-CM

## 2014-10-11 DIAGNOSIS — E291 Testicular hypofunction: Secondary | ICD-10-CM

## 2014-10-11 DIAGNOSIS — Z6828 Body mass index (BMI) 28.0-28.9, adult: Secondary | ICD-10-CM

## 2014-10-11 DIAGNOSIS — E559 Vitamin D deficiency, unspecified: Secondary | ICD-10-CM

## 2014-10-11 DIAGNOSIS — I10 Essential (primary) hypertension: Secondary | ICD-10-CM

## 2014-10-11 LAB — CBC WITH DIFFERENTIAL/PLATELET
BASOS ABS: 0 10*3/uL (ref 0.0–0.1)
BASOS PCT: 0 % (ref 0–1)
EOS ABS: 0.1 10*3/uL (ref 0.0–0.7)
Eosinophils Relative: 2 % (ref 0–5)
HCT: 41.2 % (ref 39.0–52.0)
Hemoglobin: 14.4 g/dL (ref 13.0–17.0)
Lymphocytes Relative: 25 % (ref 12–46)
Lymphs Abs: 1.5 10*3/uL (ref 0.7–4.0)
MCH: 33.1 pg (ref 26.0–34.0)
MCHC: 35 g/dL (ref 30.0–36.0)
MCV: 94.7 fL (ref 78.0–100.0)
MPV: 9.8 fL (ref 8.6–12.4)
Monocytes Absolute: 0.5 10*3/uL (ref 0.1–1.0)
Monocytes Relative: 9 % (ref 3–12)
Neutro Abs: 3.7 10*3/uL (ref 1.7–7.7)
Neutrophils Relative %: 64 % (ref 43–77)
PLATELETS: 274 10*3/uL (ref 150–400)
RBC: 4.35 MIL/uL (ref 4.22–5.81)
RDW: 13 % (ref 11.5–15.5)
WBC: 5.8 10*3/uL (ref 4.0–10.5)

## 2014-10-11 LAB — HEMOGLOBIN A1C
Hgb A1c MFr Bld: 5.2 % (ref <5.7)
Mean Plasma Glucose: 103 mg/dL (ref <117)

## 2014-10-12 LAB — LIPID PANEL
CHOL/HDL RATIO: 3.3 ratio (ref <=5.0)
Cholesterol: 189 mg/dL (ref 125–200)
HDL: 58 mg/dL (ref >=40)
LDL Cholesterol: 95 mg/dL (ref <130)
Triglycerides: 179 mg/dL — ABNORMAL HIGH (ref <150)
VLDL: 36 mg/dL — ABNORMAL HIGH (ref <30)

## 2014-10-12 LAB — HEPATIC FUNCTION PANEL
ALT: 19 U/L (ref 9–46)
AST: 17 U/L (ref 10–40)
Albumin: 3.7 g/dL (ref 3.6–5.1)
Alkaline Phosphatase: 53 U/L (ref 40–115)
BILIRUBIN DIRECT: 0.1 mg/dL (ref <=0.2)
BILIRUBIN TOTAL: 0.5 mg/dL (ref 0.2–1.2)
Indirect Bilirubin: 0.4 mg/dL (ref 0.2–1.2)
Total Protein: 6.3 g/dL (ref 6.1–8.1)

## 2014-10-12 LAB — INSULIN, RANDOM: Insulin: 8.7 u[IU]/mL (ref 2.0–19.6)

## 2014-10-12 LAB — MAGNESIUM: MAGNESIUM: 2 mg/dL (ref 1.5–2.5)

## 2014-10-12 LAB — VITAMIN D 25 HYDROXY (VIT D DEFICIENCY, FRACTURES): VIT D 25 HYDROXY: 49 ng/mL (ref 30–100)

## 2014-10-12 LAB — TSH: TSH: 1.38 u[IU]/mL (ref 0.350–4.500)

## 2014-10-29 ENCOUNTER — Other Ambulatory Visit: Payer: Self-pay

## 2014-10-29 MED ORDER — ALPRAZOLAM 1 MG PO TABS: ORAL_TABLET | ORAL | Status: DC

## 2014-11-10 ENCOUNTER — Encounter: Payer: Self-pay | Admitting: Internal Medicine

## 2015-01-02 HISTORY — PX: KNEE ARTHROSCOPY WITH ANTERIOR CRUCIATE LIGAMENT (ACL) REPAIR: SHX5644

## 2015-01-17 ENCOUNTER — Ambulatory Visit: Payer: Self-pay | Admitting: Physician Assistant

## 2015-01-19 ENCOUNTER — Encounter: Payer: Self-pay | Admitting: Internal Medicine

## 2015-01-20 IMAGING — CR DG FOREARM 2V*R*
3 series · 3 of 3 positions shown · non-contrast
Comparison: None.

CLINICAL DATA: Dog bite

RIGHT FOREARM - 2 VIEW

[x forearm ap right]
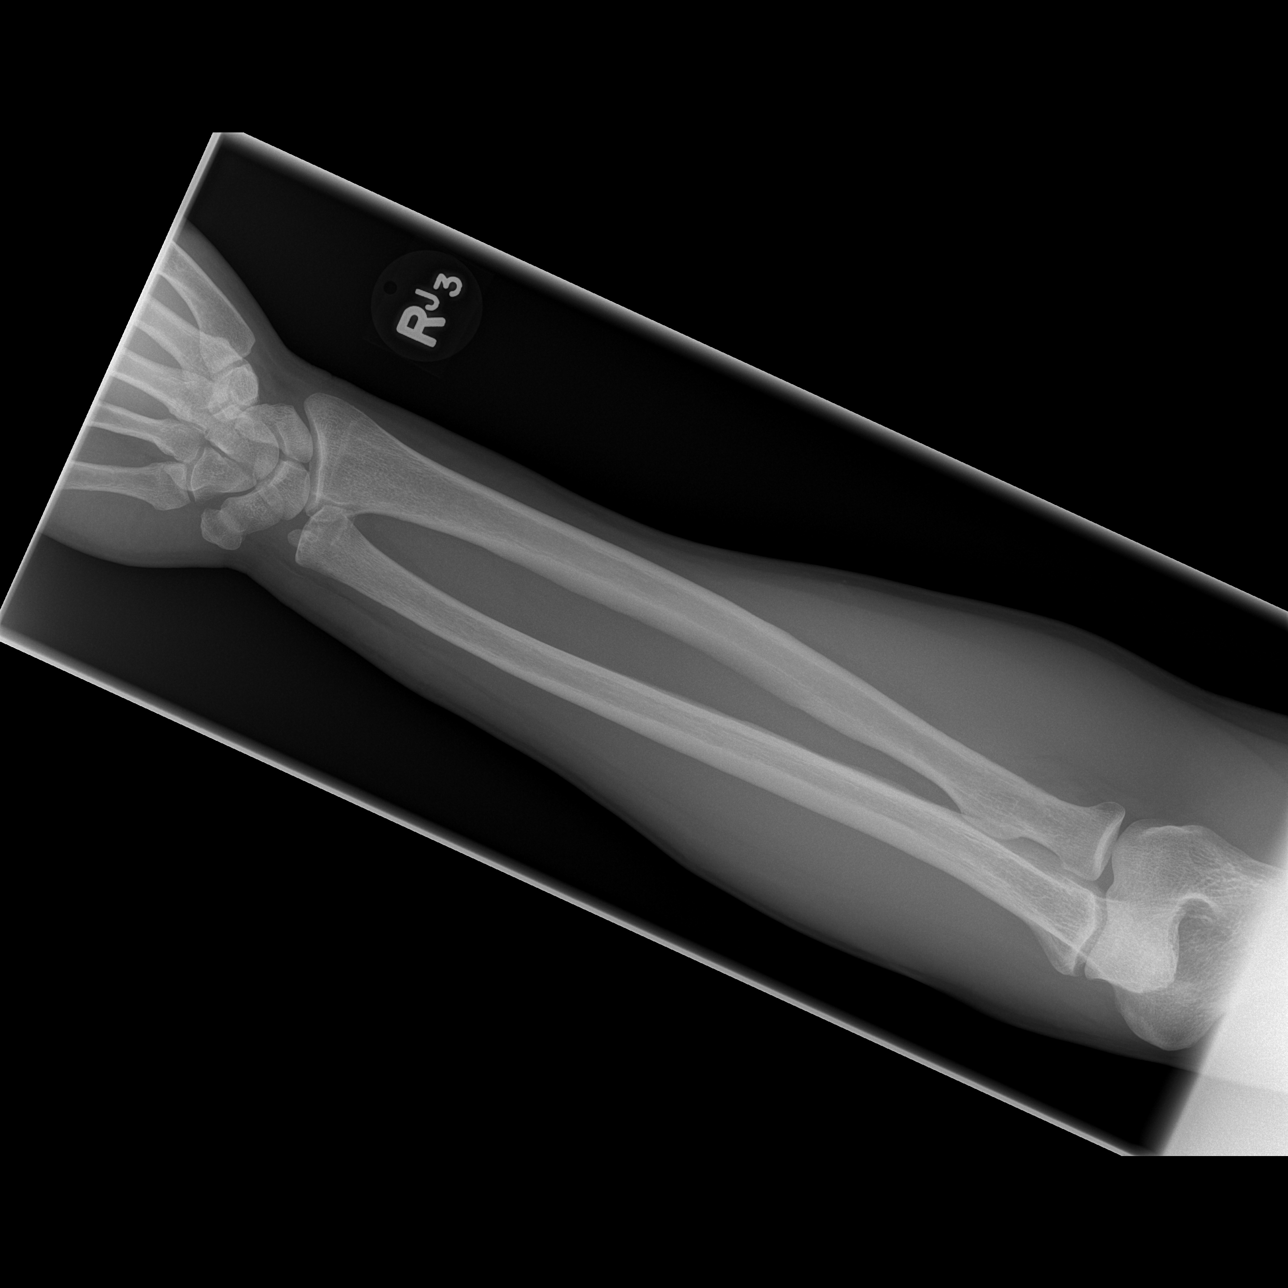

[x forearm lat right (1 of 2)]
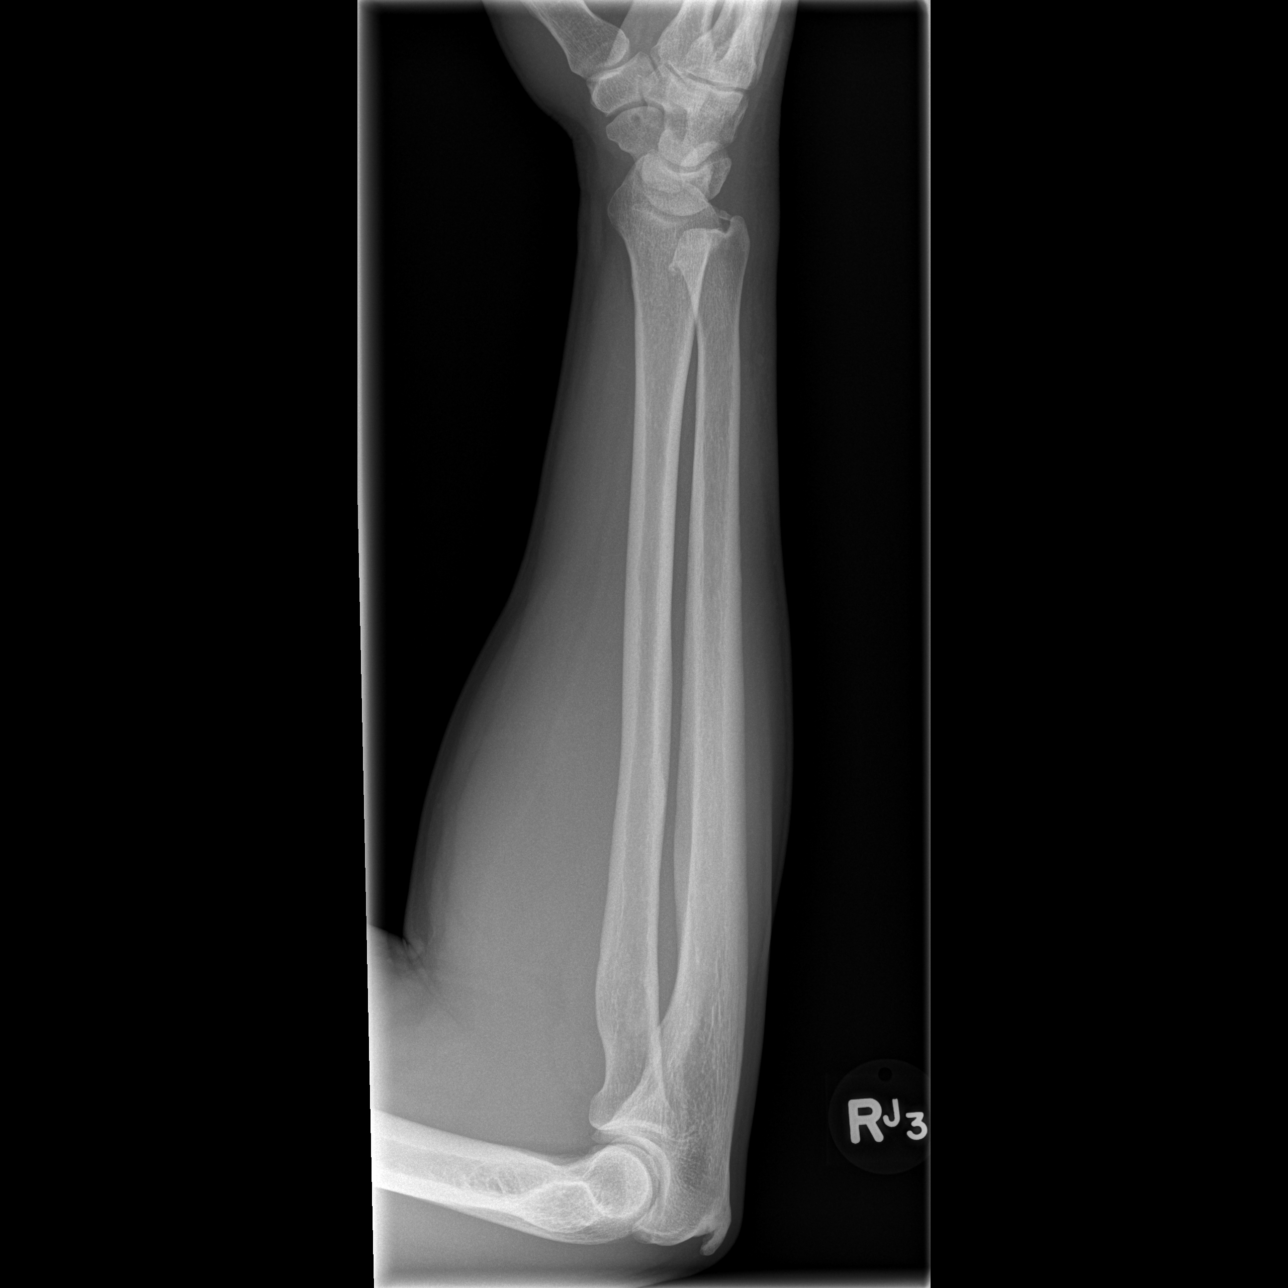

[x forearm lat right (2 of 2)]
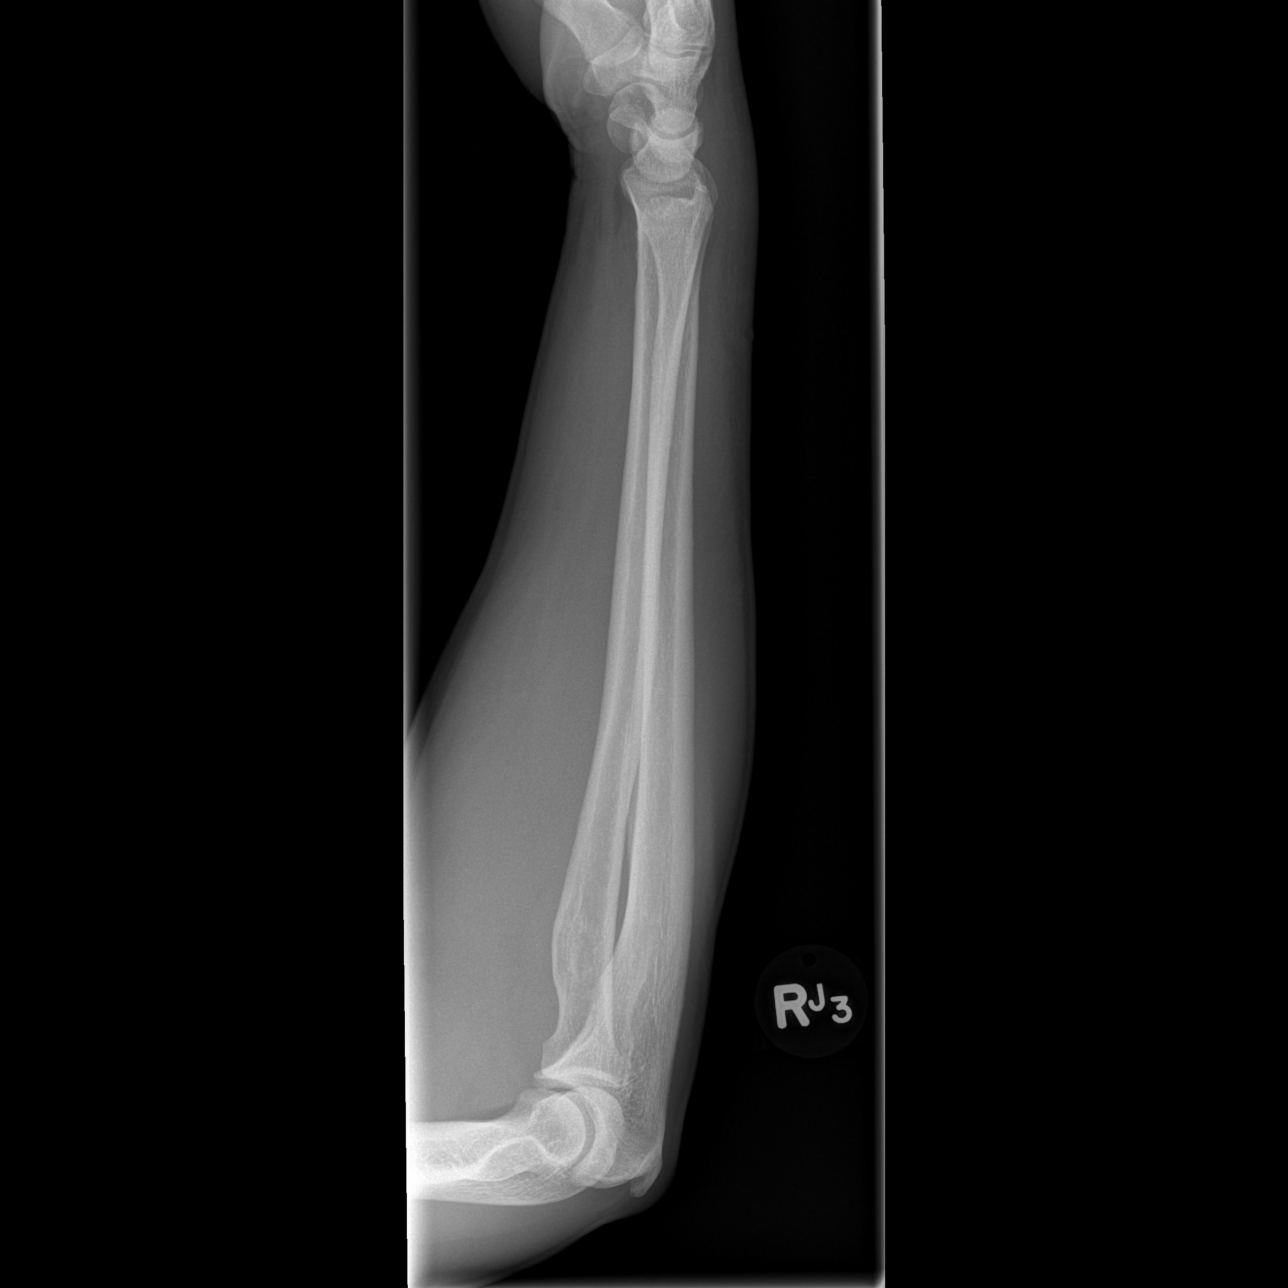

[3 of 3 positions shown; findings below may reference images not displayed]

FINDINGS: Three views of the right forearm submitted.  No acute
fracture or subluxation.  No radiopaque foreign body.
IMPRESSION: No acute fracture or subluxation.

## 2015-02-01 ENCOUNTER — Other Ambulatory Visit: Payer: Self-pay | Admitting: Internal Medicine

## 2015-02-01 DIAGNOSIS — M1000 Idiopathic gout, unspecified site: Secondary | ICD-10-CM

## 2015-02-01 DIAGNOSIS — K120 Recurrent oral aphthae: Secondary | ICD-10-CM

## 2015-02-01 MED ORDER — ACYCLOVIR 800 MG PO TABS: ORAL_TABLET | ORAL | Status: DC

## 2015-02-01 MED ORDER — PREDNISONE 20 MG PO TABS: ORAL_TABLET | ORAL | Status: DC

## 2015-02-15 ENCOUNTER — Other Ambulatory Visit: Payer: Self-pay | Admitting: Internal Medicine

## 2015-03-07 ENCOUNTER — Encounter: Payer: Self-pay | Admitting: Physician Assistant

## 2015-03-07 ENCOUNTER — Ambulatory Visit (INDEPENDENT_AMBULATORY_CARE_PROVIDER_SITE_OTHER): Payer: 59 | Admitting: Physician Assistant

## 2015-03-07 VITALS — BP 126/80 | HR 66 | Temp 98.6°F | Resp 16 | Ht 71.0 in | Wt 199.6 lb

## 2015-03-07 DIAGNOSIS — Z79899 Other long term (current) drug therapy: Secondary | ICD-10-CM

## 2015-03-07 DIAGNOSIS — E291 Testicular hypofunction: Secondary | ICD-10-CM | POA: Diagnosis not present

## 2015-03-07 DIAGNOSIS — R7309 Other abnormal glucose: Secondary | ICD-10-CM

## 2015-03-07 DIAGNOSIS — F1020 Alcohol dependence, uncomplicated: Secondary | ICD-10-CM

## 2015-03-07 DIAGNOSIS — E785 Hyperlipidemia, unspecified: Secondary | ICD-10-CM | POA: Diagnosis not present

## 2015-03-07 DIAGNOSIS — E559 Vitamin D deficiency, unspecified: Secondary | ICD-10-CM

## 2015-03-07 DIAGNOSIS — I10 Essential (primary) hypertension: Secondary | ICD-10-CM | POA: Diagnosis not present

## 2015-03-07 LAB — CBC WITH DIFFERENTIAL/PLATELET
BASOS ABS: 0 10*3/uL (ref 0.0–0.1)
Basophils Relative: 0 % (ref 0–1)
Eosinophils Absolute: 0.1 10*3/uL (ref 0.0–0.7)
Eosinophils Relative: 2 % (ref 0–5)
HEMATOCRIT: 39.3 % (ref 39.0–52.0)
HEMOGLOBIN: 14.2 g/dL (ref 13.0–17.0)
LYMPHS ABS: 0.8 10*3/uL (ref 0.7–4.0)
LYMPHS PCT: 13 % (ref 12–46)
MCH: 34.1 pg — ABNORMAL HIGH (ref 26.0–34.0)
MCHC: 36.1 g/dL — ABNORMAL HIGH (ref 30.0–36.0)
MCV: 94.2 fL (ref 78.0–100.0)
MPV: 9.6 fL (ref 8.6–12.4)
Monocytes Absolute: 0.5 10*3/uL (ref 0.1–1.0)
Monocytes Relative: 9 % (ref 3–12)
NEUTROS ABS: 4.5 10*3/uL (ref 1.7–7.7)
NEUTROS PCT: 76 % (ref 43–77)
PLATELETS: 212 10*3/uL (ref 150–400)
RBC: 4.17 MIL/uL — ABNORMAL LOW (ref 4.22–5.81)
RDW: 13.4 % (ref 11.5–15.5)
WBC: 5.9 10*3/uL (ref 4.0–10.5)

## 2015-03-07 LAB — BASIC METABOLIC PANEL WITH GFR
BUN: 27 mg/dL — AB (ref 7–25)
CHLORIDE: 94 mmol/L — AB (ref 98–110)
CO2: 27 mmol/L (ref 20–31)
Calcium: 8.8 mg/dL (ref 8.6–10.3)
Creat: 1.68 mg/dL — ABNORMAL HIGH (ref 0.60–1.35)
GFR, Est African American: 55 mL/min — ABNORMAL LOW (ref >=60)
GFR, Est Non African American: 48 mL/min — ABNORMAL LOW (ref >=60)
GLUCOSE: 103 mg/dL — AB (ref 65–99)
POTASSIUM: 3.9 mmol/L (ref 3.5–5.3)
Sodium: 138 mmol/L (ref 135–146)

## 2015-03-07 LAB — LIPID PANEL
CHOL/HDL RATIO: 2.3 ratio (ref <=5.0)
Cholesterol: 200 mg/dL (ref 125–200)
HDL: 87 mg/dL (ref >=40)
LDL CALC: 75 mg/dL (ref <130)
TRIGLYCERIDES: 188 mg/dL — AB (ref <150)
VLDL: 38 mg/dL — ABNORMAL HIGH (ref <30)

## 2015-03-07 LAB — HEPATIC FUNCTION PANEL
ALBUMIN: 4 g/dL (ref 3.6–5.1)
ALK PHOS: 39 U/L — AB (ref 40–115)
ALT: 28 U/L (ref 9–46)
AST: 25 U/L (ref 10–40)
BILIRUBIN DIRECT: 0.2 mg/dL (ref <=0.2)
BILIRUBIN TOTAL: 0.8 mg/dL (ref 0.2–1.2)
Indirect Bilirubin: 0.6 mg/dL (ref 0.2–1.2)
Total Protein: 7 g/dL (ref 6.1–8.1)

## 2015-03-07 LAB — MAGNESIUM: Magnesium: 1.5 mg/dL (ref 1.5–2.5)

## 2015-03-07 LAB — TSH: TSH: 1.4 mIU/L (ref 0.40–4.50)

## 2015-03-07 NOTE — Progress Notes (Signed)
Assessment and Plan:  1. Hypertension -Continue medication, will add on low dose HCTZ, monitor blood pressure at home. Continue DASH diet.  Reminder to go to the ER if any CP, SOB, nausea, dizziness, severe HA, changes vision/speech, left arm numbness and tingling and jaw pain.  2. Cholesterol -Continue diet and exercise. Check cholesterol.   3. Prediabetes  -Continue diet and exercise. Check A1C  4. Vitamin D Def - check level and continue medications.  5. GERD Counseled on alcohol and NSAID use leading to ulcers  6. Alcoholism Discussed treatment programs/AA, declines treatment at this time.    Continue diet and meds as discussed. Further disposition pending results of labs. Over 30 minutes of exam, counseling, chart review, and critical decision making was performed  Future Appointments Date Time Provider [place]  05/23/2015 9:00 AM Unk Pinto, MD [facility] None    HPI 50 y.o. male  presents for 3 month follow up on hypertension, cholesterol, prediabetes, and vitamin D deficiency and has a history of daily alcohol use.   Patient had skiing accident in Jan, had surgery for ACL/MCL and mensicus repair, still doing PT.  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   His blood pressure has been controlled at home,  today their BP is BP: 126/80 mmHg  He does workout. He denies chest pain, shortness of breath, dizziness.  He is on cholesterol medication, 1/2 pill every day and denies myalgias. His cholesterol is not at goal. The cholesterol last visit was:   Lab Results  Component Value Date   CHOL 189 10/11/2014   HDL 58 10/11/2014   LDLCALC 95 10/11/2014   TRIG 179* 10/11/2014   CHOLHDL 3.3 10/11/2014    He has been working on diet and exercise for prediabetes, and denies paresthesia of the feet, polydipsia, polyuria and visual disturbances. Last A1C in the office was:  Lab Results  Component Value Date   HGBA1C 5.2 10/11/2014  On  xanax at night before bed and very rarely during the day if he is doing public speaking.  Patient is on Vitamin D supplement.   Lab Results  Component Value Date   VD25OH 49 10/11/2014      Current Medications:  Current Outpatient Prescriptions on File Prior to Visit  Medication Sig Dispense Refill  . acyclovir (ZOVIRAX) 800 MG tablet Take 1 tablet daily for fever blisters 90 tablet 1  . ALPRAZolam (XANAX) 1 MG tablet Take 1/2 to 1 tablet 3 x day if needed for anxiety or sleep 270 tablet 5  . atorvastatin (LIPITOR) 80 MG tablet Take 1 tablet daily or as directed for Cholesterol 90 tablet 1  . OVER THE COUNTER MEDICATION Claritin-D 24 hour 1 daily.    . predniSONE (DELTASONE) 20 MG tablet 1 tab 3 x day for 3 days, then 1 tab 2 x day for 3 days, then 1 tab 1 x day for 5 days for acute Gout flare-up 20 tablet 2   No current facility-administered medications on file prior to visit.   Medical History:  Past Medical History  Diagnosis Date  . Hypertension   . Anxiety   . Hyperlipidemia   . Hypogonadism male   . Vitamin D deficiency   . Encounter for long-term (current) use of other medications   . Alcoholism ([place])    Allergies:  Allergies  Allergen Reactions  . Citalopram     dyphoria     Review of Systems:  Review of Systems  Constitutional:  Negative.   HENT: Negative.   Eyes: Negative.   Respiratory: Negative.   Cardiovascular: Negative for chest pain, palpitations, orthopnea, claudication, leg swelling and PND.  Gastrointestinal: Positive for heartburn (has been on more NSAIDS and drinks nightly). Negative for nausea, vomiting, abdominal pain, diarrhea, constipation, blood in stool and melena.  Genitourinary: Negative.   Musculoskeletal: Negative.   Skin: Negative.   Neurological: Negative.   Endo/Heme/Allergies: Negative.   Psychiatric/Behavioral: Negative.     Family history- Review and unchanged Social history- Review and unchanged Physical Exam: BP 126/80 mmHg   Pulse 66  Temp(Src) 98.6 F (37 C) (Temporal)  Resp 16  Ht 5\' 11"  (1.803 m)  Wt 199 lb 9.6 oz (90.538 kg)  BMI 27.85 kg/m2  SpO2 98% Wt Readings from Last 3 Encounters:  03/07/15 199 lb 9.6 oz (90.538 kg)  10/11/14 201 lb 6.4 oz (91.354 kg)  05/05/14 207 lb (93.895 kg)   General Appearance: Well nourished, in no apparent distress. Eyes: PERRLA, EOMs, conjunctiva no swelling or erythema Sinuses: No Frontal/maxillary tenderness ENT/Mouth: Ext aud canals clear, TMs without erythema, bulging. No erythema, swelling, or exudate on post pharynx.  Tonsils not swollen or erythematous. Hearing normal.  Neck: Supple, thyroid normal.  Respiratory: Respiratory effort normal, BS equal bilaterally without rales, rhonchi, wheezing or stridor.  Cardio: RRR with no MRGs. Brisk peripheral pulses without edema.  Abdomen: Soft, + BS,  Non tender, no guarding, rebound, hernias, masses. Lymphatics: Non tender without lymphadenopathy.  Musculoskeletal: Full ROM, 5/5 strength, Normal gait Skin: Warm, dry without rashes, lesions, ecchymosis.  Neuro: Cranial nerves intact. Normal muscle tone, no cerebellar symptoms. Psych: Awake and oriented X 3, normal affect, Insight and Judgment appropriate.    Vicie Mutters, PA-C 9:44 AM [facility]

## 2015-03-07 NOTE — Patient Instructions (Signed)
9 Ways to Naturally Increase Testosterone Levels  1.   Lose Weight If you're overweight, shedding the excess pounds may increase your testosterone levels, according to research presented at the Endocrine Society's 2012 meeting. Overweight men are more likely to have low testosterone levels to begin with, so this is an important trick to increase your body's testosterone production when you need it most.  2.   High-Intensity Exercise like Peak Fitness  Short intense exercise has a proven positive effect on increasing testosterone levels and preventing its decline. That's unlike aerobics or prolonged moderate exercise, which have shown to have negative or no effect on testosterone levels. Having a whey protein meal after exercise can further enhance the satiety/testosterone-boosting impact (hunger hormones cause the opposite effect on your testosterone and libido). Here's a summary of what a typical high-intensity Peak Fitness routine might look like: " Warm up for three minutes  " Exercise as hard and fast as you can for 30 seconds. You should feel like you couldn't possibly go on another few seconds  " Recover at a slow to moderate pace for 90 seconds  " Repeat the high intensity exercise and recovery 7 more times .  3.   Consume Plenty of Zinc The mineral zinc is important for testosterone production, and supplementing your diet for as little as six weeks has been shown to cause a marked improvement in testosterone among men with low levels.1 Likewise, research has shown that restricting dietary sources of zinc leads to a significant decrease in testosterone, while zinc supplementation increases it2 -- and even protects men from exercised-induced reductions in testosterone levels.3 It's estimated that up to 45 percent of adults over the age of 60 may have lower than recommended zinc intakes; even when dietary supplements were added in, an estimated 20-25 percent of older adults still had inadequate  zinc intakes, according to a National Health and Nutrition Examination Survey.4 Your diet is the best source of zinc; along with protein-rich foods like meats and fish, other good dietary sources of zinc include raw milk, raw cheese, beans, and yogurt or kefir made from raw milk. It can be difficult to obtain enough dietary zinc if you're a vegetarian, and also for meat-eaters as well, largely because of conventional farming methods that rely heavily on chemical fertilizers and pesticides. These chemicals deplete the soil of nutrients ... nutrients like zinc that must be absorbed by plants in order to be passed on to you. In many cases, you may further deplete the nutrients in your food by the way you prepare it. For most food, cooking it will drastically reduce its levels of nutrients like zinc . particularly over-cooking, which many people do. If you decide to use a zinc supplement, stick to a dosage of less than 40 mg a day, as this is the recommended adult upper limit. Taking too much zinc can interfere with your body's ability to absorb other minerals, especially copper, and may cause nausea as a side effect.  4.   Strength Training In addition to Peak Fitness, strength training is also known to boost testosterone levels, provided you are doing so intensely enough. When strength training to boost testosterone, you'll want to increase the weight and lower your number of reps, and then focus on exercises that work a large number of muscles, such as dead lifts or squats.  You can "turbo-charge" your weight training by going slower. By slowing down your movement, you're actually turning it into a high-intensity exercise. Super Slow   movement allows your muscle, at the microscopic level, to access the maximum number of cross-bridges between the protein filaments that produce movement in the muscle.   5.   Optimize Your Vitamin D Levels Vitamin D, a steroid hormone, is essential for the healthy development  of the nucleus of the sperm cell, and helps maintain semen quality and sperm count. Vitamin D also increases levels of testosterone, which may boost libido. In one study, overweight men who were given vitamin D supplements had a significant increase in testosterone levels after one year.5   6.   Reduce Stress When you're under a lot of stress, your body releases high levels of the stress hormone cortisol. This hormone actually blocks the effects of testosterone,6 presumably because, from a biological standpoint, testosterone-associated behaviors (mating, competing, aggression) may have lowered your chances of survival in an emergency (hence, the "fight or flight" response is dominant, courtesy of cortisol).  7.   Limit or Eliminate Sugar from Your Diet Testosterone levels decrease after you eat sugar, which is likely because the sugar leads to a high insulin level, another factor leading to low testosterone.7 Based on USDA estimates, the average American consumes 12 teaspoons of sugar a day, which equates to about TWO TONS of sugar during a lifetime.  8.   Eat Healthy Fats By healthy, this means not only mon- and polyunsaturated fats, like that found in avocadoes and nuts, but also saturated, as these are essential for building testosterone. Research shows that a diet with less than 40 percent of energy as fat (and that mainly from animal sources, i.e. saturated) lead to a decrease in testosterone levels.8 My personal diet is about 60-70 percent healthy fat, and other experts agree that the ideal diet includes somewhere between 50-70 percent fat.  It's important to understand that your body requires saturated fats from animal and vegetable sources (such as meat, dairy, certain oils, and tropical plants like coconut) for optimal functioning, and if you neglect this important food group in favor of sugar, grains and other starchy carbs, your health and weight are almost guaranteed to suffer. Examples of  healthy fats you can eat more of to give your testosterone levels a boost include: Olives and Olive oil  Coconuts and coconut oil Butter made from raw grass-fed organic milk Raw nuts, such as, almonds or pecans Organic pastured egg yolks Avocados Grass-fed meats Palm oil Unheated organic nut oils   9.   Boost Your Intake of Branch Chain Amino Acids (BCAA) from Foods Like Joshua Kerr suggests that BCAAs result in higher testosterone levels, particularly when taken along with resistance training.9 While BCAAs are available in supplement form, you'll find the highest concentrations of BCAAs like leucine in dairy products - especially quality cheeses and whey protein. Even when getting leucine from your natural food supply, it's often wasted or used as a building block instead of an anabolic agent. So to create the correct anabolic environment, you need to boost leucine consumption way beyond mere maintenance levels. That said, keep in mind that using leucine as a free form amino acid can be highly counterproductive as when free form amino acids are artificially administrated, they rapidly enter your circulation while disrupting insulin function, and impairing your body's glycemic control. Food-based leucine is really the ideal form that can benefit your muscles without side effects.    Food Choices for Gastroesophageal Reflux Disease, Adult When you have gastroesophageal reflux disease (GERD), the foods you eat and your eating habits are  very important. Choosing the right foods can help ease the discomfort of GERD. WHAT GENERAL GUIDELINES DO I NEED TO FOLLOW?  Choose fruits, vegetables, whole grains, low-fat dairy products, and low-fat meat, fish, and poultry.  Limit fats such as oils, salad dressings, butter, nuts, and avocado.  Keep a food diary to identify foods that cause symptoms.  Avoid foods that cause reflux. These may be different for different people.  Eat frequent small meals  instead of three large meals each day.  Eat your meals slowly, in a relaxed setting.  Limit fried foods.  Cook foods using methods other than frying.  Avoid drinking alcohol.  Avoid drinking large amounts of liquids with your meals.  Avoid bending over or lying down until 2-3 hours after eating. WHAT FOODS ARE NOT RECOMMENDED? The following are some foods and drinks that may worsen your symptoms: Vegetables Tomatoes. Tomato juice. Tomato and spaghetti sauce. Chili peppers. Onion and garlic. Horseradish. Fruits Oranges, grapefruit, and lemon (fruit and juice). Meats High-fat meats, fish, and poultry. This includes hot dogs, ribs, ham, sausage, salami, and bacon. Dairy Whole milk and chocolate milk. Sour cream. Cream. Butter. Ice cream. Cream cheese.  Beverages Coffee and tea, with or without caffeine. Carbonated beverages or energy drinks. Condiments Hot sauce. Barbecue sauce.  Sweets/Desserts Chocolate and cocoa. Donuts. Peppermint and spearmint. Fats and Oils High-fat foods, including [place] fries and potato chips. Other Vinegar. Strong spices, such as black pepper, white pepper, red pepper, cayenne, curry powder, cloves, ginger, and chili powder. The items listed above may not be a complete list of foods and beverages to avoid. Contact your dietitian for more information.   This information is not intended to replace advice given to you by your health care provider. Make sure you discuss any questions you have with your health care provider.   Document Released: 12/18/2004 Document Revised: 01/08/2014 Document Reviewed: 10/22/2012 Elsevier Interactive Patient Education [employer].

## 2015-03-08 LAB — VITAMIN D 25 HYDROXY (VIT D DEFICIENCY, FRACTURES): Vit D, 25-Hydroxy: 65 ng/mL (ref 30–100)

## 2015-03-08 NOTE — Addendum Note (Signed)
Addended by: Vicie Mutters R on: 03/08/2015 07:29 AM   Modules accepted: Orders

## 2015-03-21 ENCOUNTER — Other Ambulatory Visit: Payer: 59

## 2015-03-21 DIAGNOSIS — Z79899 Other long term (current) drug therapy: Secondary | ICD-10-CM

## 2015-03-21 DIAGNOSIS — I10 Essential (primary) hypertension: Secondary | ICD-10-CM

## 2015-03-21 LAB — BASIC METABOLIC PANEL WITH GFR
BUN: 18 mg/dL (ref 7–25)
CALCIUM: 9 mg/dL (ref 8.6–10.3)
CO2: 28 mmol/L (ref 20–31)
CREATININE: 1.07 mg/dL (ref 0.60–1.35)
Chloride: 104 mmol/L (ref 98–110)
GFR, EST NON AFRICAN AMERICAN: 82 mL/min (ref >=60)
GFR, Est African American: >89 mL/min (ref >=60)
GLUCOSE: 91 mg/dL (ref 65–99)
Potassium: 4.3 mmol/L (ref 3.5–5.3)
Sodium: 141 mmol/L (ref 135–146)

## 2015-03-21 LAB — MAGNESIUM: Magnesium: 1.6 mg/dL (ref 1.5–2.5)

## 2015-03-24 ENCOUNTER — Other Ambulatory Visit: Payer: Self-pay | Admitting: Internal Medicine

## 2015-05-07 ENCOUNTER — Other Ambulatory Visit: Payer: Self-pay | Admitting: Internal Medicine

## 2015-05-23 ENCOUNTER — Encounter: Payer: Self-pay | Admitting: Internal Medicine

## 2015-07-13 ENCOUNTER — Other Ambulatory Visit: Payer: Self-pay | Admitting: Internal Medicine

## 2015-07-13 ENCOUNTER — Other Ambulatory Visit: Payer: Self-pay

## 2015-07-13 DIAGNOSIS — K120 Recurrent oral aphthae: Secondary | ICD-10-CM

## 2015-07-13 MED ORDER — ACYCLOVIR 800 MG PO TABS: ORAL_TABLET | ORAL | Status: DC

## 2015-07-31 NOTE — Progress Notes (Addendum)
[facility]   Joshua Kerr, M.D.    Joshua Kerr, Joshua Kerr      Joshua Kerr, Joshua Kerr   [facility]                [address] [place] [id number] Telephone [phone]  Annual  Screening/Preventative Visit And Comprehensive Evaluation & Examination Telefax [phone]     This very nice 50 y.o. DWM presents for a Wellness/Preventative Visit & comprehensive evaluation and management of multiple medical co-morbidities.  Patient has been followed for HTN, Prediabetes, Hyperlipidemia and Vitamin D Deficiency.     HTN predates circa 2006. Patient's BP has been controlled at home.Today's BP: 120/80. Patient denies any cardiac symptoms as chest pain, palpitations, shortness of breath, dizziness or ankle swelling.     Patient's hyperlipidemia is controlled with diet and medications. Patient denies myalgias or other medication SE's. Last lipids were at goal: Lab Results  Component Value Date   CHOL 200 03/07/2015   HDL 87 03/07/2015   LDLCALC 75 03/07/2015   TRIG 188 (H) 03/07/2015   CHOLHDL 2.3 03/07/2015       Patient is screened proactively for prediabetes and patient denies reactive hypoglycemic symptoms, visual blurring, diabetic polys or paresthesias. Last A1c was at goal. Lab Results  Component Value Date   HGBA1C 5.2 10/11/2014       Finally, patient has history of Vitamin D Deficiency of "25" in 2008 and last vitamin D was at goal.  Lab Results  Component Value Date   VD25OH 30 03/07/2015   Current Outpatient Prescriptions on File Prior to Visit  Medication Sig  . acyclovir (ZOVIRAX) 800 MG tablet Take 1 tablet daily for fever blisters  . ALPRAZolam (XANAX) 1 MG tablet TAKE 1/2 TO 1 TABLET THREE TIMES A DAY AS NEEDED FOR ANXIETY  . atorvastatin (LIPITOR) 80 MG tablet TAKE 1 TABLET BY MOUTH EVERY DAY AS DIRECTED FOR CHOLESTEROL  . Cholecalciferol (VITAMIN D3) 400 units  CAPS Take by mouth.  . enalapril (VASOTEC) 20 MG tablet TAKE TAKE 1 TABLET BY MOUTH EVERY DAY FOR BLOOD PRESSURE  . hydrochlorothiazide (HYDRODIURIL) 25 MG tablet TAKE 1 TABLET BY MOUTH EVERY DAY FOR BLOOD PRESSURE AND FLUID  . ibuprofen (ADVIL,MOTRIN) 200 MG tablet Take by mouth.  Marland Kitchen OVER THE COUNTER MEDICATION Claritin-D 24 hour 1 daily.  . Oxycodone HCl 10 MG TABS    Allergies  Allergen Reactions  . Citalopram     dyphoria   Past Medical History:  Diagnosis Date  . Anxiety   . Encounter for long-term (current) use of other medications   . Hyperlipidemia   . Hypertension   . Hypogonadism male   . Vitamin D deficiency         Social History   Social History  . Marital status: Legally Separated    Spouse name: N/A  . Number of children: N/A  . Years of education: N/A   Occupational History  . Owner of an auto dealership.   Social History Main Topics  . Smoking status: Never Smoker  . Smokeless tobacco: Never Used  . Drug use: No  . Sexual activity: Yes    ROS Constitutional: Denies fever, chills, weight loss/gain, headaches, insomnia,  night sweats or change in appetite. Does c/o fatigue. Eyes: Denies redness, blurred vision, diplopia, discharge, itchy or watery eyes.  ENT: Denies discharge, congestion,  post nasal drip, epistaxis, sore throat, earache, hearing loss, dental pain, Tinnitus, Vertigo, Sinus pain or snoring.  Cardio: Denies chest pain, palpitations, irregular heartbeat, syncope, dyspnea, diaphoresis, orthopnea, PND, claudication or edema Respiratory: denies cough, dyspnea, DOE, pleurisy, hoarseness, laryngitis or wheezing.  Gastrointestinal: Denies dysphagia, heartburn, reflux, water brash, pain, cramps, nausea, vomiting, bloating, diarrhea, constipation, hematemesis, melena, hematochezia, jaundice or hemorrhoids Genitourinary: Denies dysuria, frequency, urgency, nocturia, hesitancy, discharge, hematuria or flank pain Musculoskeletal: Denies arthralgia,  myalgia, stiffness, Jt. Swelling, pain, limp or strain/sprain. Denies Falls. Skin: Denies puritis, rash, hives, warts, acne, eczema or change in skin lesion Neuro: No weakness, tremor, incoordination, spasms, paresthesia or pain Psychiatric: Denies confusion, memory loss or sensory loss. Denies Depression. Endocrine: Denies change in weight, skin, hair change, nocturia, and paresthesia, diabetic polys, visual blurring or hyper / hypo glycemic episodes.  Heme/Lymph: No excessive bleeding, bruising or enlarged lymph nodes.  Physical Exam  BP 120/80   Pulse 92   Temp 97.9 F (36.6 C)   Resp 16   Ht 5\' 11"  (1.803 m)   Wt 196 lb 12.8 oz (89.3 kg)   BMI 27.45 kg/m   General Appearance: Well nourished, in no apparent distress.  Eyes: PERRLA, EOMs, conjunctiva no swelling or erythema, normal fundi and vessels. Sinuses: No frontal/maxillary tenderness ENT/Mouth: EACs patent / TMs  nl. Nares clear without erythema, swelling, mucoid exudates. Oral hygiene is good. No erythema, swelling, or exudate. Tongue normal, non-obstructing. Tonsils not swollen or erythematous. Hearing normal.  Neck: Supple, thyroid normal. No bruits, nodes or JVD. Respiratory: Respiratory effort normal.  BS equal and clear bilateral without rales, rhonci, wheezing or stridor. Cardio: Heart sounds are normal with regular rate and rhythm and no murmurs, rubs or gallops. Peripheral pulses are normal and equal bilaterally without edema. No aortic or femoral bruits. Chest: symmetric with normal excursions and percussion.  Abdomen: Soft, with Nl bowel sounds. Nontender, no guarding, rebound, hernias, masses, or organomegaly.  Lymphatics: Non tender without lymphadenopathy.  Genitourinary: No hernias.Testes nl. DRE - prostate nl for age - smooth & firm w/o nodules. Musculoskeletal: Full ROM all peripheral extremities, joint stability, 5/5 strength, and normal gait. Skin: Warm and dry without rashes, lesions, cyanosis, clubbing or   ecchymosis.  Neuro: Cranial nerves intact, reflexes equal bilaterally. Normal muscle tone, no cerebellar symptoms. Sensation intact.  Pysch: Alert and oriented X 3 with normal affect, insight and judgment appropriate.   Assessment and Plan  1. Annual Preventative/Screening Exam   - Microalbumin / creatinine urine ratio - EKG 12-Lead - POC Hemoccult Bld/Stl  - Urinalysis, Routine w reflex microscopic  - Vitamin B12 - Iron and TIBC - PSA - Testosterone - CBC with Differential/Platelet - BASIC METABOLIC PANEL WITH GFR - Hepatic function panel - Magnesium - Lipid panel - TSH - Hemoglobin A1c - Insulin, random - VITAMIN D 25 Hydroxy   2. Essential hypertension  - Microalbumin / creatinine urine ratio - EKG 12-Lead - TSH  3. Hyperlipidemia  - Lipid panel - TSH  4. Abnormal glucose  - Hemoglobin A1c - Insulin, random  5. Vitamin D deficiency  - VITAMIN D 25 Hydroxy   6. Testosterone Deficiency  - Testosterone  7. Screening for ischemic heart disease  - EKG 12-Lead  8. Other fatigue  - Vitamin B12 - Iron and TIBC - Testosterone - CBC with Differential/Platelet - TSH  9. Medication management  - Urinalysis, Routine w reflex microscopic  - CBC with Differential/Platelet - BASIC METABOLIC PANEL WITH GFR - Hepatic function  panel - Magnesium  10. Encounter for screening for malignant neoplasm of rectum  - POC Hemoccult Bld/Stl  11. Prostate cancer screening  - PSA  12. Possible exposure to STD  - RPR - HIV antibody - HSV(herpes simplex vrs) 1+2 ab-IgG - GC/Chlamydia Probe Amp  13. Screening examination for pulmonary tuberculosis  - PPD  14. Need for prophylactic vaccination with combined diphtheria-tetanus-pertussis (DTP) vaccine  - Tdap vaccine greater than or equal to 7yo IM   Continue prudent diet as discussed, weight control, BP monitoring, regular exercise, and medications as discussed.  Discussed med effects and SE's. Routine  screening labs and tests as requested with regular follow-up as recommended. Over 40 minutes of exam, counseling, chart review and high complex critical decision making was performed

## 2015-08-01 ENCOUNTER — Ambulatory Visit (INDEPENDENT_AMBULATORY_CARE_PROVIDER_SITE_OTHER): Payer: 59 | Admitting: Internal Medicine

## 2015-08-01 ENCOUNTER — Other Ambulatory Visit: Payer: Self-pay | Admitting: Internal Medicine

## 2015-08-01 ENCOUNTER — Encounter: Payer: Self-pay | Admitting: Internal Medicine

## 2015-08-01 VITALS — BP 120/80 | HR 92 | Temp 97.9°F | Resp 16 | Ht 71.0 in | Wt 196.8 lb

## 2015-08-01 DIAGNOSIS — Z0000 Encounter for general adult medical examination without abnormal findings: Secondary | ICD-10-CM | POA: Diagnosis not present

## 2015-08-01 DIAGNOSIS — I10 Essential (primary) hypertension: Secondary | ICD-10-CM

## 2015-08-01 DIAGNOSIS — Z111 Encounter for screening for respiratory tuberculosis: Secondary | ICD-10-CM | POA: Diagnosis not present

## 2015-08-01 DIAGNOSIS — Z136 Encounter for screening for cardiovascular disorders: Secondary | ICD-10-CM | POA: Diagnosis not present

## 2015-08-01 DIAGNOSIS — E785 Hyperlipidemia, unspecified: Secondary | ICD-10-CM

## 2015-08-01 DIAGNOSIS — Z79899 Other long term (current) drug therapy: Secondary | ICD-10-CM

## 2015-08-01 DIAGNOSIS — E291 Testicular hypofunction: Secondary | ICD-10-CM

## 2015-08-01 DIAGNOSIS — Z125 Encounter for screening for malignant neoplasm of prostate: Secondary | ICD-10-CM

## 2015-08-01 DIAGNOSIS — R7309 Other abnormal glucose: Secondary | ICD-10-CM

## 2015-08-01 DIAGNOSIS — Z23 Encounter for immunization: Secondary | ICD-10-CM

## 2015-08-01 DIAGNOSIS — Z202 Contact with and (suspected) exposure to infections with a predominantly sexual mode of transmission: Secondary | ICD-10-CM

## 2015-08-01 DIAGNOSIS — E559 Vitamin D deficiency, unspecified: Secondary | ICD-10-CM

## 2015-08-01 DIAGNOSIS — Z1212 Encounter for screening for malignant neoplasm of rectum: Secondary | ICD-10-CM

## 2015-08-01 DIAGNOSIS — R5383 Other fatigue: Secondary | ICD-10-CM

## 2015-08-01 DIAGNOSIS — Z0001 Encounter for general adult medical examination with abnormal findings: Secondary | ICD-10-CM

## 2015-08-01 LAB — HEPATIC FUNCTION PANEL
ALBUMIN: 4.3 g/dL (ref 3.6–5.1)
ALK PHOS: 42 U/L (ref 40–115)
ALT: 102 U/L — ABNORMAL HIGH (ref 9–46)
AST: 67 U/L — ABNORMAL HIGH (ref 10–40)
BILIRUBIN INDIRECT: 0.4 mg/dL (ref 0.2–1.2)
BILIRUBIN TOTAL: 0.6 mg/dL (ref 0.2–1.2)
Bilirubin, Direct: 0.2 mg/dL (ref <=0.2)
TOTAL PROTEIN: 6.9 g/dL (ref 6.1–8.1)

## 2015-08-01 LAB — URINALYSIS, MICROSCOPIC ONLY
Bacteria, UA: NONE SEEN [HPF]
CASTS: NONE SEEN [LPF]
Crystals: NONE SEEN [HPF]
RBC / HPF: NONE SEEN RBC/HPF (ref <=2)
Yeast: NONE SEEN [HPF]

## 2015-08-01 LAB — BASIC METABOLIC PANEL WITH GFR
BUN: 19 mg/dL (ref 7–25)
CALCIUM: 9.5 mg/dL (ref 8.6–10.3)
CO2: 27 mmol/L (ref 20–31)
Chloride: 99 mmol/L (ref 98–110)
Creat: 1.2 mg/dL (ref 0.60–1.35)
GFR, EST AFRICAN AMERICAN: 83 mL/min (ref >=60)
GFR, EST NON AFRICAN AMERICAN: 72 mL/min (ref >=60)
GLUCOSE: 85 mg/dL (ref 65–99)
POTASSIUM: 3.9 mmol/L (ref 3.5–5.3)
Sodium: 140 mmol/L (ref 135–146)

## 2015-08-01 LAB — HIV ANTIBODY (ROUTINE TESTING W REFLEX): HIV 1&2 Ab, 4th Generation: NONREACTIVE

## 2015-08-01 LAB — URINALYSIS, ROUTINE W REFLEX MICROSCOPIC
BILIRUBIN URINE: NEGATIVE
GLUCOSE, UA: NEGATIVE
KETONES UR: NEGATIVE
Leukocytes, UA: NEGATIVE
Nitrite: NEGATIVE
PH: 5 (ref 5.0–8.0)
SPECIFIC GRAVITY, URINE: 1.013 (ref 1.001–1.035)

## 2015-08-01 LAB — IRON AND TIBC
%SAT: 29 % (ref 15–60)
Iron: 86 ug/dL (ref 50–180)
TIBC: 297 ug/dL (ref 250–425)
UIBC: 211 ug/dL (ref 125–400)

## 2015-08-01 LAB — TSH: TSH: 0.93 mIU/L (ref 0.40–4.50)

## 2015-08-01 LAB — CBC WITH DIFFERENTIAL/PLATELET
BASOS PCT: 0 %
Basophils Absolute: 0 cells/uL (ref 0–200)
Eosinophils Absolute: 224 cells/uL (ref 15–500)
Eosinophils Relative: 4 %
HCT: 41.4 % (ref 38.5–50.0)
Hemoglobin: 14.6 g/dL (ref 13.2–17.1)
LYMPHS PCT: 29 %
Lymphs Abs: 1624 cells/uL (ref 850–3900)
MCH: 34.4 pg — ABNORMAL HIGH (ref 27.0–33.0)
MCHC: 35.3 g/dL (ref 32.0–36.0)
MCV: 97.4 fL (ref 80.0–100.0)
MONOS PCT: 9 %
MPV: 9.3 fL (ref 7.5–12.5)
Monocytes Absolute: 504 cells/uL (ref 200–950)
NEUTROS PCT: 58 %
Neutro Abs: 3248 cells/uL (ref 1500–7800)
PLATELETS: 233 10*3/uL (ref 140–400)
RBC: 4.25 MIL/uL (ref 4.20–5.80)
RDW: 14 % (ref 11.0–15.0)
WBC: 5.6 10*3/uL (ref 3.8–10.8)

## 2015-08-01 LAB — LIPID PANEL
CHOLESTEROL: 174 mg/dL (ref 125–200)
HDL: 87 mg/dL (ref >=40)
LDL CALC: 66 mg/dL (ref <130)
TRIGLYCERIDES: 104 mg/dL (ref <150)
Total CHOL/HDL Ratio: 2 Ratio (ref <=5.0)
VLDL: 21 mg/dL (ref <30)

## 2015-08-01 LAB — HEMOGLOBIN A1C
Hgb A1c MFr Bld: 5.5 % (ref <5.7)
MEAN PLASMA GLUCOSE: 111 mg/dL

## 2015-08-01 LAB — MAGNESIUM: Magnesium: 1.9 mg/dL (ref 1.5–2.5)

## 2015-08-01 LAB — VITAMIN B12: Vitamin B-12: 345 pg/mL (ref 200–1100)

## 2015-08-01 MED ORDER — RANITIDINE HCL 300 MG PO TABS: ORAL_TABLET | ORAL | 3 refills | Status: DC

## 2015-08-01 NOTE — Patient Instructions (Signed)

## 2015-08-02 LAB — RPR

## 2015-08-02 LAB — MICROALBUMIN / CREATININE URINE RATIO
CREATININE, URINE: 123 mg/dL (ref 20–370)
Microalb Creat Ratio: 154 mcg/mg creat — ABNORMAL HIGH (ref <30)
Microalb, Ur: 18.9 mg/dL

## 2015-08-02 LAB — HSV(HERPES SIMPLEX VRS) I + II AB-IGG
HSV 1 GLYCOPROTEIN G AB, IGG: 3.18 {index} — AB (ref <0.90)
HSV 2 GLYCOPROTEIN G AB, IGG: 12.6 {index} — AB (ref <0.90)

## 2015-08-02 LAB — INSULIN, RANDOM: INSULIN: 7.1 u[IU]/mL (ref 2.0–19.6)

## 2015-08-02 LAB — PSA: PSA: 2.8 ng/mL (ref <=4.00)

## 2015-08-02 LAB — VITAMIN D 25 HYDROXY (VIT D DEFICIENCY, FRACTURES): VIT D 25 HYDROXY: 77 ng/mL (ref 30–100)

## 2015-08-02 LAB — TESTOSTERONE: TESTOSTERONE: 261 ng/dL (ref 250–827)

## 2015-08-10 LAB — TB SKIN TEST
INDURATION: 0 mm
TB Skin Test: NEGATIVE

## 2015-08-24 ENCOUNTER — Other Ambulatory Visit: Payer: Self-pay | Admitting: Internal Medicine

## 2015-10-07 ENCOUNTER — Other Ambulatory Visit: Payer: Self-pay | Admitting: *Deleted

## 2015-10-07 MED ORDER — ALPRAZOLAM 1 MG PO TABS: ORAL_TABLET | ORAL | 0 refills | Status: DC

## 2015-10-09 ENCOUNTER — Other Ambulatory Visit: Payer: Self-pay | Admitting: Internal Medicine

## 2015-10-31 ENCOUNTER — Other Ambulatory Visit: Payer: Self-pay | Admitting: Internal Medicine

## 2015-10-31 MED ORDER — PREDNISONE 20 MG PO TABS: ORAL_TABLET | ORAL | 0 refills | Status: DC

## 2015-11-14 ENCOUNTER — Ambulatory Visit: Payer: Self-pay | Admitting: Physician Assistant

## 2015-11-21 ENCOUNTER — Ambulatory Visit (INDEPENDENT_AMBULATORY_CARE_PROVIDER_SITE_OTHER): Payer: 59 | Admitting: Physician Assistant

## 2015-11-21 ENCOUNTER — Other Ambulatory Visit: Payer: Self-pay | Admitting: Physician Assistant

## 2015-11-21 ENCOUNTER — Encounter: Payer: Self-pay | Admitting: Physician Assistant

## 2015-11-21 VITALS — BP 134/74 | HR 88 | Temp 97.9°F | Resp 14 | Ht 71.0 in | Wt 209.0 lb

## 2015-11-21 DIAGNOSIS — I10 Essential (primary) hypertension: Secondary | ICD-10-CM | POA: Diagnosis not present

## 2015-11-21 DIAGNOSIS — E559 Vitamin D deficiency, unspecified: Secondary | ICD-10-CM | POA: Diagnosis not present

## 2015-11-21 DIAGNOSIS — R7309 Other abnormal glucose: Secondary | ICD-10-CM

## 2015-11-21 DIAGNOSIS — R7989 Other specified abnormal findings of blood chemistry: Secondary | ICD-10-CM | POA: Diagnosis not present

## 2015-11-21 DIAGNOSIS — Z79899 Other long term (current) drug therapy: Secondary | ICD-10-CM

## 2015-11-21 DIAGNOSIS — R945 Abnormal results of liver function studies: Secondary | ICD-10-CM

## 2015-11-21 DIAGNOSIS — E785 Hyperlipidemia, unspecified: Secondary | ICD-10-CM | POA: Diagnosis not present

## 2015-11-21 DIAGNOSIS — E291 Testicular hypofunction: Secondary | ICD-10-CM | POA: Diagnosis not present

## 2015-11-21 LAB — BASIC METABOLIC PANEL WITH GFR
BUN: 14 mg/dL (ref 7–25)
CALCIUM: 8.9 mg/dL (ref 8.6–10.3)
CO2: 27 mmol/L (ref 20–31)
CREATININE: 1.1 mg/dL (ref 0.60–1.35)
Chloride: 100 mmol/L (ref 98–110)
GFR, EST NON AFRICAN AMERICAN: 79 mL/min (ref >=60)
GLUCOSE: 104 mg/dL — AB (ref 65–99)
Potassium: 4.6 mmol/L (ref 3.5–5.3)
SODIUM: 138 mmol/L (ref 135–146)

## 2015-11-21 LAB — HEPATIC FUNCTION PANEL
ALT: 54 U/L — ABNORMAL HIGH (ref 9–46)
AST: 37 U/L (ref 10–40)
Albumin: 4.3 g/dL (ref 3.6–5.1)
Alkaline Phosphatase: 43 U/L (ref 40–115)
BILIRUBIN DIRECT: 0.2 mg/dL (ref <=0.2)
BILIRUBIN INDIRECT: 0.4 mg/dL (ref 0.2–1.2)
TOTAL PROTEIN: 6.9 g/dL (ref 6.1–8.1)
Total Bilirubin: 0.6 mg/dL (ref 0.2–1.2)

## 2015-11-21 LAB — CBC WITH DIFFERENTIAL/PLATELET
BASOS ABS: 0 {cells}/uL (ref 0–200)
Basophils Relative: 0 %
EOS ABS: 130 {cells}/uL (ref 15–500)
Eosinophils Relative: 2 %
HEMATOCRIT: 41.9 % (ref 38.5–50.0)
HEMOGLOBIN: 14.6 g/dL (ref 13.2–17.1)
LYMPHS ABS: 1235 {cells}/uL (ref 850–3900)
Lymphocytes Relative: 19 %
MCH: 33.9 pg — AB (ref 27.0–33.0)
MCHC: 34.8 g/dL (ref 32.0–36.0)
MCV: 97.2 fL (ref 80.0–100.0)
MONO ABS: 650 {cells}/uL (ref 200–950)
MPV: 9.7 fL (ref 7.5–12.5)
Monocytes Relative: 10 %
NEUTROS PCT: 69 %
Neutro Abs: 4485 cells/uL (ref 1500–7800)
Platelets: 245 10*3/uL (ref 140–400)
RBC: 4.31 MIL/uL (ref 4.20–5.80)
RDW: 14.4 % (ref 11.0–15.0)
WBC: 6.5 10*3/uL (ref 3.8–10.8)

## 2015-11-21 LAB — LIPID PANEL
CHOLESTEROL: 160 mg/dL (ref <200)
HDL: 76 mg/dL (ref >40)
LDL Cholesterol: 65 mg/dL (ref <100)
TRIGLYCERIDES: 93 mg/dL (ref <150)
Total CHOL/HDL Ratio: 2.1 Ratio (ref <5.0)
VLDL: 19 mg/dL (ref <30)

## 2015-11-21 LAB — MAGNESIUM: Magnesium: 1.6 mg/dL (ref 1.5–2.5)

## 2015-11-21 LAB — TSH: TSH: 1.38 m[IU]/L (ref 0.40–4.50)

## 2015-11-21 MED ORDER — LORATADINE-PSEUDOEPHEDRINE ER 10-240 MG PO TB24: 1.0000 | ORAL_TABLET | Freq: Every day | ORAL | 3 refills | Status: AC

## 2015-11-21 NOTE — Progress Notes (Signed)
Assessment and Plan:   Hypertension -Continue medication, monitor blood pressure at home. Continue DASH diet.  Reminder to go to the ER if any CP, SOB, nausea, dizziness, severe HA, changes vision/speech, left arm numbness and tingling and jaw pain.  Cholesterol -Continue diet and exercise. Check cholesterol.   Prediabetes  -Continue diet and exercise. Check A1C   Vitamin D Def - check level and continue medications.  GERD Counseled on alcohol and NSAID use leading to ulcers   Alcoholism Discussed treatment programs/AA, declines treatment at this time.    LFTs  Stop tylenol/alcohol, check Hep C, follow up if needed  Continue diet and meds as discussed. Further disposition pending results of labs. Over 30 minutes of exam, counseling, chart review, and critical decision making was performed  Future Appointments Date Time Provider [place]  02/27/2016 9:30 AM Unk Pinto, MD [facility] None  08/29/2016 9:00 AM Unk Pinto, MD [facility] None    HPI 50 y.o. male  presents for 3 month follow up on hypertension, cholesterol, prediabetes, and vitamin D deficiency and has a history of daily alcohol use.   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   His blood pressure has been controlled at home,  today their BP is BP: 134/74  He does workout. He denies chest pain, shortness of breath, dizziness. He is on xanax 1 at night only for sleep.   He is on cholesterol medication, 1/2 pill every day and denies myalgias. His cholesterol is not at goal. The cholesterol last visit was:   Lab Results  Component Value Date   CHOL 174 08/01/2015   HDL 87 08/01/2015   LDLCALC 66 08/01/2015   TRIG 104 08/01/2015   CHOLHDL 2.0 08/01/2015    He has been working on diet and exercise for prediabetes, and denies paresthesia of the feet, polydipsia, polyuria and visual disturbances. Last A1C in the office was:  Lab Results  Component Value Date   HGBA1C 5.5  08/01/2015  On xanax at night before bed and very rarely during the day if he is doing public speaking.  Patient is on Vitamin D supplement.   Lab Results  Component Value Date   VD25OH 77 08/01/2015     BMI is Body mass index is 29.15 kg/m., he is working on diet and exercise. Wt Readings from Last 3 Encounters:  11/21/15 209 lb (94.8 kg)  08/01/15 196 lb 12.8 oz (89.3 kg)  03/07/15 199 lb 9.6 oz (90.5 kg)    Current Medications:  Current Outpatient Prescriptions on File Prior to Visit  Medication Sig Dispense Refill  . acyclovir (ZOVIRAX) 800 MG tablet Take 1 tablet daily for fever blisters 90 tablet 1  . ALPRAZolam (XANAX) 1 MG tablet TAKE 1/2 TO 1 TABLET THREE TIMES A DAY AS NEEDED FOR ANXIETY 20 tablet 0  . atorvastatin (LIPITOR) 80 MG tablet TAKE 1 TABLET BY MOUTH EVERY DAY AS DIRECTED FOR CHOLESTEROL 90 tablet 1  . Cholecalciferol (VITAMIN D3) 400 units CAPS Take 5,000 Units by mouth daily.     . enalapril (VASOTEC) 20 MG tablet TAKE TAKE 1 TABLET BY MOUTH EVERY DAY FOR BLOOD PRESSURE 90 tablet 0  . hydrochlorothiazide (HYDRODIURIL) 25 MG tablet TAKE 1 TABLET BY MOUTH EVERY DAY FOR BLOOD PRESSURE AND FLUID 90 tablet 1  . ibuprofen (ADVIL,MOTRIN) 200 MG tablet Take by mouth.    Marland Kitchen OVER THE COUNTER MEDICATION Claritin-D 24 hour 1 daily.    . predniSONE (DELTASONE) 20 MG tablet  1 tab 3 x day for 3 days, then 1 tab 2 x day for 3 days, then 1 tab 1 x day for 5 days 20 tablet 0  . ranitidine (ZANTAC) 300 MG tablet Take 1 to 2 tablets daily for heartburn & reflux 180 tablet 3   No current facility-administered medications on file prior to visit.    Medical History:  Past Medical History:  Diagnosis Date  . Alcoholism ([place])   . Anxiety   . Encounter for long-term (current) use of other medications   . Hyperlipidemia   . Hypertension   . Hypogonadism male   . Vitamin D deficiency    Allergies:  Allergies  Allergen Reactions  . Citalopram     dyphoria     Review of  Systems:  Review of Systems  Constitutional: Negative.   HENT: Negative.   Eyes: Negative.   Respiratory: Negative.   Cardiovascular: Negative for chest pain, palpitations, orthopnea, claudication, leg swelling and PND.  Gastrointestinal: Positive for heartburn (has been on more NSAIDS and drinks nightly). Negative for abdominal pain, blood in stool, constipation, diarrhea, melena, nausea and vomiting.  Genitourinary: Negative.   Musculoskeletal: Negative.   Skin: Negative.   Neurological: Negative.   Endo/Heme/Allergies: Negative.   Psychiatric/Behavioral: Negative.     Family history- Review and unchanged Social history- Review and unchanged Physical Exam: BP 134/74   Pulse 88   Temp 97.9 F (36.6 C) (Temporal)   Resp 14   Ht 5\' 11"  (1.803 m)   Wt 209 lb (94.8 kg)   BMI 29.15 kg/m  Wt Readings from Last 3 Encounters:  11/21/15 209 lb (94.8 kg)  08/01/15 196 lb 12.8 oz (89.3 kg)  03/07/15 199 lb 9.6 oz (90.5 kg)   General Appearance: Well nourished, in no apparent distress. Eyes: PERRLA, EOMs, conjunctiva no swelling or erythema Sinuses: No Frontal/maxillary tenderness ENT/Mouth: Ext aud canals clear, TMs without erythema, bulging. No erythema, swelling, or exudate on post pharynx.  Tonsils not swollen or erythematous. Hearing normal.  Neck: Supple, thyroid normal.  Respiratory: Respiratory effort normal, BS equal bilaterally without rales, rhonchi, wheezing or stridor.  Cardio: RRR with no MRGs. Brisk peripheral pulses without edema.  Abdomen: Soft, + BS,  Non tender, no guarding, rebound, hernias, masses. Lymphatics: Non tender without lymphadenopathy.  Musculoskeletal: Full ROM, 5/5 strength, Normal gait Skin: Warm, dry without rashes, lesions, ecchymosis.  Neuro: Cranial nerves intact. Normal muscle tone, no cerebellar symptoms. Psych: Awake and oriented X 3, normal affect, Insight and Judgment appropriate.    Vicie Mutters, PA-C 9:00 AM [facility]

## 2015-11-21 NOTE — Patient Instructions (Signed)
Fatty Liver Fatty liver is the accumulation of fat in liver cells. It is also called hepatosteatosis or steatohepatitis. It is normal for your liver to contain some fat. If fat is more than 5 to 10% of your liver's weight, you have fatty liver.  There are often no symptoms (problems) for years while damage is still occurring. People often learn about their fatty liver when they have medical tests for other reasons. Fat can damage your liver for years or even decades without causing problems. When it becomes severe, it can cause fatigue, weight loss, weakness, and confusion. This makes you more likely to develop more serious liver problems. The liver is the largest organ in the body. It does a lot of work and often gives no warning signs when it is sick until late in a disease. The liver has many important jobs including:  Breaking down foods.  Storing vitamins, iron, and other minerals.  Making proteins.  Making bile for food digestion.  Breaking down many products including medications, alcohol and some poisons.  PROGNOSIS  Fatty liver may cause no damage or it can lead to an inflammation of the liver. This is, called steatohepatitis.  Over time the liver may become scarred and hardened. This condition is called cirrhosis. Cirrhosis is serious and may lead to liver failure or cancer. NASH is one of the leading causes of cirrhosis. About 10-20% of Americans have fatty liver and a smaller 2-5% has NASH.  TREATMENT   Weight loss, fat restriction, and exercise in overweight patients produces inconsistent results but is worth trying.  Good control of diabetes may reduce fatty liver.  Eat a balanced, healthy diet.  Increase your physical activity.  There are no medical or surgical treatments for a fatty liver or NASH, but improving your diet and increasing your exercise may help prevent or reverse some of the damage.   We want weight loss that will last so you should lose 1-2 pounds a  week.  THAT IS IT! Please pick THREE things a month to change. Once it is a habit check off the item. Then pick another three items off the list to become habits.  If you are already doing a habit on the list GREAT!  Cross that item off! o Don't drink your calories. Ie, alcohol, soda, fruit juice, and sweet tea.  o Drink more water. Drink a glass when you feel hungry or before each meal.  o Eat breakfast - Complex carb and protein (likeDannon light and fit yogurt, oatmeal, fruit, eggs, [place] bacon). o Measure your cereal.  Eat no more than one cup a day. (ie [place]) o Eat an apple a day. o Add a vegetable a day. o Try a new vegetable a month. o Use Pam! Stop using oil or butter to cook. o Don't finish your plate or use smaller plates. o Share your dessert. o Eat sugar free Jello for dessert or frozen grapes. o Don't eat 2-3 hours before bed. o Switch to whole wheat bread, pasta, and brown rice. o Make healthier choices when you eat out. No fries! o Pick baked chicken, NOT fried. o Don't forget to SLOW DOWN when you eat. It is not going anywhere.  o Take the stairs. o Park far away in the parking lot o [employer] (or weights) for 10 minutes while watching TV. o Walk at work for 10 minutes during break. o Walk outside 1 time a week with your friend, kids, dog, or significant other. o  Start a walking group at [place] as much as you can tolerate.  o Keep a food diary. o Weigh yourself daily. o Walk for 15 minutes 3 days per week. o Cook at home more often and eat out less.  If life happens and you go back to old habits, it is okay.  Just start over. You can do it!   If you experience chest pain, get short of breath, or tired during the exercise, please stop immediately and inform your doctor.

## 2015-11-22 LAB — HEPATITIS C ANTIBODY: HCV AB: NEGATIVE

## 2015-11-22 LAB — HEMOGLOBIN A1C
HEMOGLOBIN A1C: 5.4 % (ref <5.7)
MEAN PLASMA GLUCOSE: 108 mg/dL

## 2015-11-22 LAB — GAMMA GT: GGT: 166 U/L — ABNORMAL HIGH (ref 7–51)

## 2015-11-22 LAB — VITAMIN D 25 HYDROXY (VIT D DEFICIENCY, FRACTURES): Vit D, 25-Hydroxy: 69 ng/mL (ref 30–100)

## 2015-11-23 ENCOUNTER — Other Ambulatory Visit: Payer: Self-pay | Admitting: Internal Medicine

## 2015-11-23 NOTE — Telephone Encounter (Signed)
Please call Alprazolam 

## 2015-11-24 ENCOUNTER — Other Ambulatory Visit: Payer: Self-pay | Admitting: Internal Medicine

## 2015-12-16 ENCOUNTER — Ambulatory Visit (INDEPENDENT_AMBULATORY_CARE_PROVIDER_SITE_OTHER): Payer: 59 | Admitting: Physician Assistant

## 2015-12-16 ENCOUNTER — Encounter: Payer: Self-pay | Admitting: Physician Assistant

## 2015-12-16 VITALS — BP 126/74 | HR 78 | Temp 97.7°F | Resp 16 | Ht 71.0 in | Wt 206.0 lb

## 2015-12-16 DIAGNOSIS — J0100 Acute maxillary sinusitis, unspecified: Secondary | ICD-10-CM

## 2015-12-16 MED ORDER — PREDNISONE 20 MG PO TABS: ORAL_TABLET | ORAL | 0 refills | Status: DC

## 2015-12-16 MED ORDER — BENZONATATE 100 MG PO CAPS: 200.0000 mg | ORAL_CAPSULE | Freq: Three times a day (TID) | ORAL | 0 refills | Status: DC | PRN

## 2015-12-16 MED ORDER — AZITHROMYCIN 250 MG PO TABS
ORAL_TABLET | ORAL | 1 refills | Status: AC
Start: 2015-12-16 — End: 2015-12-21

## 2015-12-16 NOTE — Patient Instructions (Signed)
Make sure you are on an allergy pill, see below for more details. Get on flonase Please take the prednisone as directed below, this is NOT an antibiotic so you do NOT have to finish it. You can take it for a few days and stop it if you are doing better.   Please take the prednisone to help decrease inflammation and therefore decrease symptoms. Take it it with food to avoid GI upset. It can cause increased energy but on the other hand it can make it hard to sleep at night so please take it AT [place], it takes 8-12 hours to start working so it will NOT affect your sleeping if you take it at night with your food!!  If you are diabetic it will increase your sugars so decrease carbs and monitor your sugars closely.        HOW TO TREAT VIRAL COUGH AND COLD SYMPTOMS:  -Symptoms usually last at least 1 week with the worst symptoms being around day 4.  - colds usually start with a sore throat and end with a cough, and the cough can take 2 weeks to get better.  -No antibiotics are needed for colds, flu, sore throats, cough, bronchitis UNLESS symptoms are longer than 7 days OR if you are getting better then get drastically worse.  -There are a lot of combination medications (Dayquil, Nyquil, Vicks 44, tyelnol cold and sinus, ETC). Please look at the ingredients on the back so that you are treating the correct symptoms and not doubling up on medications/ingredients.    Medicines you can use  Nasal congestion  - pseudoephedrine (Sudafed)- behind the counter, do not use if you have high blood pressure, medicine that have -D in them.  - phenylephrine (Sudafed PE) -Dextormethorphan + chlorpheniramine (Coridcidin HBP)- okay if you have high blood pressure -Oxymetazoline (Afrin) nasal spray- LIMIT to 3 days -Saline nasal spray -Neti pot (used distilled or bottled water)  Ear pain/congestion  -pseudoephedrine (sudafed) - Nasonex/flonase nasal spray  Fever  -Acetaminophen (Tyelnol) -Ibuprofen  (Advil, motrin, aleve)  Sore Throat  -Acetaminophen (Tyelnol) -Ibuprofen (Advil, motrin, aleve) -Drink a lot of water -Gargle with salt water - Rest your voice (don't talk) -Throat sprays -Cough drops  Body Aches  -Acetaminophen (Tyelnol) -Ibuprofen (Advil, motrin, aleve)  Headache  -Acetaminophen (Tyelnol) -Ibuprofen (Advil, motrin, aleve) - Exedrin, Exedrin Migraine  Allergy symptoms (cough, sneeze, runny nose, itchy eyes) -Claritin or loratadine cheapest but likely the weakest  -Zyrtec or certizine at night because it can make you sleepy -The strongest is allegra or fexafinadine  Cheapest at [employer], [employer], [employer]  Cough  -Dextromethorphan (Delsym)- medicine that has DM in it -Guafenesin (Mucinex/Robitussin) - cough drops - drink lots of water  Chest Congestion  -Guafenesin (Mucinex/Robitussin)  Red Itchy Eyes  - Naphcon-A  Upset Stomach  - Bland diet (nothing spicy, greasy, fried, and high acid foods like tomatoes, oranges, berries) -OKAY- cereal, bread, soup, crackers, rice -Eat smaller more frequent meals -reduce caffeine, no alcohol -Loperamide (Imodium-AD) if diarrhea -Prevacid for heart burn  General health when sick  -Hydration -wash your hands frequently -keep surfaces clean -change pillow cases and sheets often -Get fresh air but do not exercise strenuously -Vitamin D, double up on it - Vitamin C -Zinc

## 2015-12-16 NOTE — Progress Notes (Signed)
Subjective:    Patient ID: Joshua Kerr, male    DOB: [birth year], 50 y.o.   MRN: [id number]  HPI 50 y.o. WM presents with cough x 2 weeks, not improving. Coughing yellow/green mucus, no fever or chills, sick contacts at work. Has been on mucinex D, allergy pill.   Is alcoholic, has had elevated liver, has not decreased/quit yet, will reschedule labs.   Blood pressure 126/74, pulse 78, temperature 97.7 F (36.5 C), resp. rate 16, height 5\' 11"  (1.803 m), weight 206 lb (93.4 kg), SpO2 98 %.  Medications Current Outpatient Prescriptions on File Prior to Visit  Medication Sig  . acyclovir (ZOVIRAX) 800 MG tablet Take 1 tablet daily for fever blisters  . ALPRAZolam (XANAX) 1 MG tablet TAKE 1/2 TO 1 TABLET THREE TIMES A DAY AS NEEDED FOR ANXIETY  . atorvastatin (LIPITOR) 80 MG tablet TAKE 1 TABLET BY MOUTH EVERY DAY AS DIRECTED FOR CHOLESTEROL  . Cholecalciferol (VITAMIN D3) 400 units CAPS Take 5,000 Units by mouth daily.   . enalapril (VASOTEC) 20 MG tablet TAKE TAKE 1 TABLET BY MOUTH EVERY DAY FOR BLOOD PRESSURE  . hydrochlorothiazide (HYDRODIURIL) 25 MG tablet TAKE 1 TABLET BY MOUTH EVERY DAY FOR BLOOD PRESSURE AND FLUID  . ibuprofen (ADVIL,MOTRIN) 200 MG tablet Take by mouth.  . loratadine-pseudoephedrine (CLARITIN-D 24 HOUR) 10-240 MG 24 hr tablet Take 1 tablet by mouth daily.  Marland Kitchen OVER THE COUNTER MEDICATION Claritin-D 24 hour 1 daily.  . predniSONE (DELTASONE) 20 MG tablet 1 tab 3 x day for 3 days, then 1 tab 2 x day for 3 days, then 1 tab 1 x day for 5 days  . ranitidine (ZANTAC) 300 MG tablet Take 1 to 2 tablets daily for heartburn & reflux   No current facility-administered medications on file prior to visit.     Problem list He has Hypertension; Anxiety; Hyperlipidemia; Testosterone Deficiency; Vitamin D deficiency; Medication management; Abnormal glucose; Alcoholism ([place]); BMI 28.88,  adult; Localized chondromalacia; ACL (anterior cruciate ligament) rupture; Tear of MCL (medial  collateral ligament) of knee; and Encounter for general adult medical examination with abnormal findings on his problem list.  Review of Systems  Constitutional: Negative for chills, diaphoresis and fever.  HENT: Positive for congestion, sinus pressure, sneezing and sore throat. Negative for ear pain, trouble swallowing and voice change.   Eyes: Negative.   Respiratory: Positive for cough. Negative for choking, chest tightness, shortness of breath, wheezing and stridor.   Cardiovascular: Negative.   Gastrointestinal: Negative.   Genitourinary: Negative.   Musculoskeletal: Negative for neck pain.  Neurological: Negative.  Negative for headaches.       Objective:   Physical Exam  Constitutional: He appears well-developed and well-nourished.  HENT:  Head: Normocephalic and atraumatic.  Right Ear: External ear normal.  Left Ear: External ear normal.  Nose: Right sinus exhibits maxillary sinus tenderness. Left sinus exhibits maxillary sinus tenderness.  Mouth/Throat: Oropharynx is clear and moist.  Eyes: Conjunctivae are normal. Pupils are equal, round, and reactive to light.  Neck: Normal range of motion. Neck supple.  Cardiovascular: Normal rate, regular rhythm and normal heart sounds.   Pulmonary/Chest: Effort normal and breath sounds normal. He has no wheezes.  Abdominal: Soft. Bowel sounds are normal.  Lymphadenopathy:    He has no cervical adenopathy.  Skin: Skin is warm and dry.       Assessment & Plan:  1. Acute maxillary sinusitis, recurrence not specified Stay on allergy pill, get on flonase - predniSONE (DELTASONE) 20  MG tablet; 2 tablets daily for 3 days, 1 tablet daily for 4 days.  Dispense: 10 tablet; Refill: 0 - azithromycin (ZITHROMAX) 250 MG tablet; Take 2 tablets (500 mg) on  Day 1,  followed by 1 tablet (250 mg) once daily on Days 2 through 5.  Dispense: 6 each; Refill: 1 - benzonatate (TESSALON PERLES) 100 MG capsule; Take 2 capsules (200 mg total) by mouth 3  (three) times daily as needed for cough (Max: 600mg  per day).  Dispense: 60 capsule; Refill: 0  2. Elevated liver function Will reschedule labs

## 2015-12-20 ENCOUNTER — Other Ambulatory Visit: Payer: Self-pay

## 2016-02-14 ENCOUNTER — Other Ambulatory Visit: Payer: Self-pay | Admitting: Internal Medicine

## 2016-02-27 ENCOUNTER — Ambulatory Visit (INDEPENDENT_AMBULATORY_CARE_PROVIDER_SITE_OTHER): Payer: 59 | Admitting: Internal Medicine

## 2016-02-27 ENCOUNTER — Encounter: Payer: Self-pay | Admitting: Internal Medicine

## 2016-02-27 VITALS — BP 136/108 | HR 112 | Temp 97.9°F | Resp 16 | Ht 71.0 in | Wt 208.0 lb

## 2016-02-27 DIAGNOSIS — R972 Elevated prostate specific antigen [PSA]: Secondary | ICD-10-CM

## 2016-02-27 DIAGNOSIS — E782 Mixed hyperlipidemia: Secondary | ICD-10-CM | POA: Diagnosis not present

## 2016-02-27 DIAGNOSIS — E559 Vitamin D deficiency, unspecified: Secondary | ICD-10-CM

## 2016-02-27 DIAGNOSIS — R7309 Other abnormal glucose: Secondary | ICD-10-CM | POA: Diagnosis not present

## 2016-02-27 DIAGNOSIS — Z79899 Other long term (current) drug therapy: Secondary | ICD-10-CM

## 2016-02-27 DIAGNOSIS — I10 Essential (primary) hypertension: Secondary | ICD-10-CM | POA: Diagnosis not present

## 2016-02-27 LAB — HEMOGLOBIN A1C
HEMOGLOBIN A1C: 5.2 % (ref <5.7)
MEAN PLASMA GLUCOSE: 103 mg/dL

## 2016-02-27 LAB — LIPID PANEL
CHOL/HDL RATIO: 2.5 ratio (ref <5.0)
Cholesterol: 154 mg/dL (ref <200)
HDL: 61 mg/dL (ref >40)
LDL CALC: 66 mg/dL (ref <100)
Triglycerides: 134 mg/dL (ref <150)
VLDL: 27 mg/dL (ref <30)

## 2016-02-27 LAB — HEPATIC FUNCTION PANEL
ALK PHOS: 40 U/L (ref 40–115)
ALT: 78 U/L — AB (ref 9–46)
AST: 61 U/L — AB (ref 10–40)
Albumin: 4.3 g/dL (ref 3.6–5.1)
BILIRUBIN DIRECT: 0.2 mg/dL (ref <=0.2)
Indirect Bilirubin: 0.6 mg/dL (ref 0.2–1.2)
Total Bilirubin: 0.8 mg/dL (ref 0.2–1.2)
Total Protein: 7 g/dL (ref 6.1–8.1)

## 2016-02-27 LAB — CBC WITH DIFFERENTIAL/PLATELET
BASOS ABS: 0 {cells}/uL (ref 0–200)
Basophils Relative: 0 %
EOS PCT: 2 %
Eosinophils Absolute: 136 cells/uL (ref 15–500)
HCT: 43.2 % (ref 38.5–50.0)
HEMOGLOBIN: 15.1 g/dL (ref 13.2–17.1)
LYMPHS ABS: 1632 {cells}/uL (ref 850–3900)
Lymphocytes Relative: 24 %
MCH: 33.9 pg — AB (ref 27.0–33.0)
MCHC: 35 g/dL (ref 32.0–36.0)
MCV: 97.1 fL (ref 80.0–100.0)
MPV: 10.3 fL (ref 7.5–12.5)
Monocytes Absolute: 612 cells/uL (ref 200–950)
Monocytes Relative: 9 %
NEUTROS ABS: 4420 {cells}/uL (ref 1500–7800)
Neutrophils Relative %: 65 %
Platelets: 235 10*3/uL (ref 140–400)
RBC: 4.45 MIL/uL (ref 4.20–5.80)
RDW: 14.1 % (ref 11.0–15.0)
WBC: 6.8 10*3/uL (ref 3.8–10.8)

## 2016-02-27 LAB — BASIC METABOLIC PANEL WITH GFR
BUN: 15 mg/dL (ref 7–25)
CHLORIDE: 100 mmol/L (ref 98–110)
CO2: 27 mmol/L (ref 20–31)
CREATININE: 1.21 mg/dL (ref 0.60–1.35)
Calcium: 9.8 mg/dL (ref 8.6–10.3)
GFR, Est African American: 81 mL/min (ref >=60)
GFR, Est Non African American: 70 mL/min (ref >=60)
Glucose, Bld: 108 mg/dL — ABNORMAL HIGH (ref 65–99)
Potassium: 4.3 mmol/L (ref 3.5–5.3)
SODIUM: 138 mmol/L (ref 135–146)

## 2016-02-27 LAB — PSA: PSA: 2 ng/mL (ref <=4.0)

## 2016-02-27 LAB — TSH: TSH: 1.39 m[IU]/L (ref 0.40–4.50)

## 2016-02-27 MED ORDER — BISOPROLOL-HYDROCHLOROTHIAZIDE 5-6.25 MG PO TABS: ORAL_TABLET | ORAL | 1 refills | Status: DC

## 2016-02-27 NOTE — Progress Notes (Addendum)
[facility] Unk Pinto, M.D.        Uvaldo Bristle. Silverio Lay, P.A.-C       Starlyn Skeans, P.A.-C  [facility]                [address] [place], [place] [id number] Telephone [phone] Telefax [phone] ______________________________________________________________________     This very nice 51 y.o. DWM presents for 6 month follow up with Hypertension, Hyperlipidemia, Pre-Diabetes and Vitamin D Deficiency. At his last CPE he was noted to have a rise in PSA from previous 1.22 up to 2.80.      Patient is treated for HTN & BP has been controlled at home. Today's BP is elevated at 136/108 with a mild sinus tach 112/min (which he attributes to an argument with his GF last night). Patient has had no complaints of any cardiac type chest pain, palpitations, dyspnea/orthopnea/PND, dizziness, claudication, or dependent edema.     Hyperlipidemia is controlled with diet & meds. Patient denies myalgias or other med SE's. Last Lipids were at goal: Lab Results  Component Value Date   CHOL 160 11/21/2015   HDL 76 11/21/2015   LDLCALC 65 11/21/2015   TRIG 93 11/21/2015   CHOLHDL 2.1 11/21/2015      Also, the patient  Is monitored proactively PreDiabetes and has had no symptoms of reactive hypoglycemia, diabetic polys, paresthesias or visual blurring.  Last A1c was at goal: Lab Results  Component Value Date   HGBA1C 5.4 11/21/2015      Further, the patient also has history of Vitamin D Deficiency and supplements vitamin D without any suspected side-effects. Last vitamin D was at goal:   Lab Results  Component Value Date   VD25OH 69 11/21/2015   Current Outpatient Prescriptions on File Prior to Visit  Medication Sig  . acyclovir  800 MG tablet Take 1 tablet daily for fever blisters  . ALPRAZolam  1 MG tablet TAKE 1/2-1 TAB 3 x DAY AS NEEDED FOR ANXIETY  . Atorvastatin 80 MG tablet TAKE 1 TAB EVERY DAY AS  .  VITAMIN D 5,000 Units Take   daily.   . enalapril  20 MG tablet TAKE TAKE 1 TAB EVERY DAY FOR BLOOD PRESSURE  . ibuprofen  200 MG tablet Take by mouth.  Marland Kitchen CLARITIN-D 24 HR 10-240  Take 1 tablet by mouth daily.  . ranitidine  300 MG tablet Take 1 to 2 tablets daily for heartburn & reflux   Allergies  Allergen Reactions  . Citalopram     dyphoria   PMHx:   Past Medical History:  Diagnosis Date  . Anxiety   . Encounter for long-term (current) use of other medications   . Hyperlipidemia   . Hypertension   . Hypogonadism male   . Vitamin D deficiency    Immunization History  Administered Date(s) Administered  . Influenza-Unspecified 10/07/2012  . PPD Test 01/12/2014, 08/01/2015  . Pneumococcal-Unspecified 02/11/2008  . Td 12/05/2005  . Tdap 08/01/2015   Past Surgical History:  Procedure Laterality Date  . HERNIA REPAIR    . I&D EXTREMITY Right 06/19/2012   Procedure: IRRIGATION AND DEBRIDEMENT FOREARM;  Surgeon: Tennis Must, MD;  Location: [facility];  Service: Orthopedics;  Laterality: Right;  . KNEE ARTHROSCOPY WITH ANTERIOR CRUCIATE LIGAMENT (ACL) REPAIR Left Jan 2017   Dr. Ballard Russell  . LASIK  2006  . VASECTOMY  FHx:    Reviewed / unchanged  SHx:    Reviewed / unchanged  Systems Review:  Constitutional: Denies fever, chills, wt changes, headaches, insomnia, fatigue, night sweats, change in appetite. Eyes: Denies redness, blurred vision, diplopia, discharge, itchy, watery eyes.  ENT: Denies discharge, congestion, post nasal drip, epistaxis, sore throat, earache, hearing loss, dental pain, tinnitus, vertigo, sinus pain, snoring.  CV: Denies chest pain, palpitations, irregular heartbeat, syncope, dyspnea, diaphoresis, orthopnea, PND, claudication or edema. Respiratory: denies cough, dyspnea, DOE, pleurisy, hoarseness, laryngitis, wheezing.  Gastrointestinal: Denies dysphagia, odynophagia, heartburn, reflux, water brash, abdominal pain or cramps, nausea, vomiting, bloating,  diarrhea, constipation, hematemesis, melena, hematochezia  or hemorrhoids. Genitourinary: Denies dysuria, frequency, urgency, nocturia, hesitancy, discharge, hematuria or flank pain. Musculoskeletal: Denies arthralgias, myalgias, stiffness, jt. swelling, pain, limping or strain/sprain.  Skin: Denies pruritus, rash, hives, warts, acne, eczema or change in skin lesion(s). Neuro: No weakness, tremor, incoordination, spasms, paresthesia or pain. Psychiatric: Denies confusion, memory loss or sensory loss. Endo: Denies change in weight, skin or hair change.  Heme/Lymph: No excessive bleeding, bruising or enlarged lymph nodes.  Physical Exam  BP  136/108   P  112   T 97.9 F    R 16   Ht 5\' 11"     Wt 208 lb (  BMI 29.01  Appears well nourished and in no distress.  Eyes: PERRLA, EOMs, conjunctiva no swelling or erythema. Sinuses: No frontal/maxillary tenderness ENT/Mouth: EAC's clear, TM's nl w/o erythema, bulging. Nares clear w/o erythema, swelling, exudates. Oropharynx clear without erythema or exudates. Oral hygiene is good. Tongue normal, non obstructing. Hearing intact.  Neck: Supple. Thyroid nl. Car 2+/2+ without bruits, nodes or JVD. Chest: Respirations nl with BS clear & equal w/o rales, rhonchi, wheezing or stridor.  Cor: Heart sounds normal w/ regular rate and rhythm without sig. murmurs, gallops, clicks, or rubs. Peripheral pulses normal and equal  without edema.  Abdomen: Soft & bowel sounds normal. Non-tender w/o guarding, rebound, hernias, masses, or organomegaly.  Lymphatics: Unremarkable.  Musculoskeletal: Full ROM all peripheral extremities, joint stability, 5/5 strength, and normal gait.  Skin: Warm, dry without exposed rashes, lesions or ecchymosis apparent.  Neuro: Cranial nerves intact, reflexes equal bilaterally. Sensory-motor testing grossly intact. Tendon reflexes grossly intact.  Pysch: Alert & oriented x 3.  Insight and judgement nl & appropriate. No  ideations.  Assessment and Plan:  1. Essential hypertension  - Continue medication, monitor blood pressure at home.  - Continue DASH diet. Reminder to go to the ER if any CP,  SOB, nausea, dizziness, severe HA, changes vision/speech,  left arm numbness and tingling and jaw pain. - CBC with Differential/Platelet - BASIC METABOLIC PANEL WITH GFR - TSH - bisoprolol-hydrochlorothiazide (ZIAC) 5-6.25 MG tablet; Take 1 tablet every morning for BP  Dispense: 90 tablet; Refill: 1  2. Mixed hyperlipidemia  - Continue diet/meds, exercise,& lifestyle modifications.  - Continue monitor periodic cholesterol/liver & renal functions  - Hepatic function panel - Lipid panel - TSH  3. Abnormal glucose  - Continue diet, exercise, lifestyle modifications.  - Monitor appropriate labs. - Hemoglobin A1c - Insulin, random  4. Vitamin D deficiency - Continue supplementation. - VITAMIN D 25 Hydroxy   5. Elevated PSA  - PSA  6. Medication management   - Magnesium       Recommended regular exercise, BP monitoring, weight control, and discussed med and SE's. Recommended labs to assess and monitor clinical status. Further disposition pending results of labs. Over 30 minutes of exam, counseling,  chart review was performed

## 2016-02-27 NOTE — Patient Instructions (Signed)

## 2016-02-28 LAB — MAGNESIUM: MAGNESIUM: 1.7 mg/dL (ref 1.5–2.5)

## 2016-02-28 LAB — INSULIN, RANDOM: INSULIN: 14.3 u[IU]/mL (ref 2.0–19.6)

## 2016-02-28 LAB — VITAMIN D 25 HYDROXY (VIT D DEFICIENCY, FRACTURES): Vit D, 25-Hydroxy: 66 ng/mL (ref 30–100)

## 2016-04-18 ENCOUNTER — Other Ambulatory Visit: Payer: Self-pay | Admitting: Internal Medicine

## 2016-04-19 ENCOUNTER — Other Ambulatory Visit: Payer: Self-pay | Admitting: Internal Medicine

## 2016-06-11 ENCOUNTER — Ambulatory Visit: Payer: Self-pay | Admitting: Physician Assistant

## 2016-06-18 ENCOUNTER — Ambulatory Visit: Payer: Self-pay | Admitting: Physician Assistant

## 2016-06-20 ENCOUNTER — Encounter: Payer: Self-pay | Admitting: Internal Medicine

## 2016-06-28 ENCOUNTER — Other Ambulatory Visit: Payer: Self-pay | Admitting: Internal Medicine

## 2016-06-28 MED ORDER — PREDNISONE 20 MG PO TABS: ORAL_TABLET | ORAL | 1 refills | Status: DC

## 2016-08-07 ENCOUNTER — Other Ambulatory Visit: Payer: Self-pay | Admitting: Internal Medicine

## 2016-08-07 DIAGNOSIS — K120 Recurrent oral aphthae: Secondary | ICD-10-CM

## 2016-08-22 ENCOUNTER — Other Ambulatory Visit: Payer: Self-pay | Admitting: Internal Medicine

## 2016-08-22 NOTE — Telephone Encounter (Signed)
Please call Alpraz  

## 2016-08-29 ENCOUNTER — Encounter: Payer: Self-pay | Admitting: Internal Medicine

## 2016-08-30 ENCOUNTER — Other Ambulatory Visit: Payer: Self-pay | Admitting: Internal Medicine

## 2016-09-09 ENCOUNTER — Encounter: Payer: Self-pay | Admitting: Internal Medicine

## 2016-09-09 NOTE — Progress Notes (Signed)
[facility]   Unk Pinto, M.D.      Uvaldo Bristle. Silverio Lay, P.A.-C [facility]                [address] [place], [place] [address] Telephone [phone] Telefax [phone] Annual  Screening/Preventative Visit  & Comprehensive Evaluation & Examination     This very nice 51 y.o. DWM presents for a Screening/Preventative Visit & comprehensive evaluation and management of multiple medical co-morbidities.  Patient has been followed for HTN, Prediabetes, Hyperlipidemia and Vitamin D Deficiency.     Patient was dx'd w/HTN in 2006 and BP's  have been controlled.  Today's BP is at goal - 122/84. Patient denies any cardiac symptoms as chest pain, palpitations, shortness of breath, dizziness or ankle swelling.     Patient's hyperlipidemia is controlled with diet and medications. Patient denies myalgias or other medication SE's. Last lipids were at goal: Lab Results  Component Value Date   CHOL 154 02/27/2016   HDL 61 02/27/2016   LDLCALC 66 02/27/2016   TRIG 134 02/27/2016   CHOLHDL 2.5 02/27/2016      Patient is screened proactively for prediabetes and patient denies reactive hypoglycemic symptoms, visual blurring, diabetic polys or paresthesias. Last A1c was normal & at goal: Lab Results  Component Value Date   HGBA1C 5.2 02/27/2016       Finally, patient has history of Vitamin D Deficiency ("25" in 2008) and last vitamin D was at goal: Lab Results  Component Value Date   VD25OH 66 02/27/2016   Current Outpatient Prescriptions on File Prior to Visit  Medication Sig  . acyclovir (ZOVIRAX) 800 MG tablet TAKE 1 TABLET DAILY FOR FEVER BLISTERS  . ALPRAZolam (XANAX) 1 MG tablet TAKE 1/2 TO 1 TABLET BY MOUTH 3 TIMES A DAY AS NEEDED FOR ANXIETY  . atorvastatin (LIPITOR) 80 MG tablet TAKE 1 TABLET BY MOUTH EVERY DAY AS DIRECTED FOR CHOLESTEROL  . Cholecalciferol (VITAMIN D3) 400 units CAPS Take 5,000 Units  by mouth daily.   . enalapril (VASOTEC) 20 MG tablet TAKE 1 TABLET DAILY FOR BLOOD PRESSURE  . hydrochlorothiazide (HYDRODIURIL) 25 MG tablet TAKE 1 TABLET BY MOUTH EVERY DAY FOR BLOOD PRESSURE AND FLUID  . ibuprofen (ADVIL,MOTRIN) 200 MG tablet Take by mouth.  . loratadine-pseudoephedrine (CLARITIN-D 24 HOUR) 10-240 MG 24 hr tablet Take 1 tablet by mouth daily.  Marland Kitchen OVER THE COUNTER MEDICATION Claritin-D 24 hour 1 daily.  . ranitidine (ZANTAC) 300 MG tablet Take 1 to 2 tablets daily for heartburn & reflux   No current facility-administered medications on file prior to visit.    Allergies  Allergen Reactions  . Citalopram     dyphoria   Past Medical History:  Diagnosis Date  . Anxiety   . Encounter for long-term (current) use of other medications   . Hyperlipidemia   . Hypertension   . Hypogonadism male   . Vitamin D deficiency    Health Maintenance  Topic Date Due  . INFLUENZA VACCINE  08/01/2016  . TETANUS/TDAP  07/31/2025  . HIV Screening  Completed   Immunization History  Administered Date(s) Administered  . Influenza-Unspecified 10/07/2012  . PPD Test 01/12/2014, 08/01/2015, 09/10/2016  . Pneumococcal-Unspecified 02/11/2008  . Td 12/05/2005  . Tdap 08/01/2015   Past Surgical History:  Procedure Laterality Date  . HERNIA REPAIR    . I&D EXTREMITY Right 06/19/2012  Procedure: IRRIGATION AND DEBRIDEMENT FOREARM;  Surgeon: Tennis Must, MD;  Location: [facility];  Service: Orthopedics;  Laterality: Right;  . KNEE ARTHROSCOPY WITH ANTERIOR CRUCIATE LIGAMENT (ACL) REPAIR Left Jan 2017   Dr. Ballard Russell  . LASIK  2006  . VASECTOMY     Family History  Problem Relation Age of Onset  . Cancer Mother        Stomach, lymphoma, melanoma  . Diabetes Mother   . Heart disease Mother    Social History   Social History  . Marital status: Divorced    Spouse name: N/A  . Number of children: N/A  . Years of education: N/A   Occupational History  . Not on file.   Social History  Main Topics  . Smoking status: Never Smoker  . Smokeless tobacco: Never Used  . Alcohol use No  . Drug use: No  . Sexual activity: Yes    ROS Constitutional: Denies fever, chills, weight loss/gain, headaches, insomnia,  night sweats or change in appetite. Does c/o fatigue. Eyes: Denies redness, blurred vision, diplopia, discharge, itchy or watery eyes.  ENT: Denies discharge, congestion, post nasal drip, epistaxis, sore throat, earache, hearing loss, dental pain, Tinnitus, Vertigo, Sinus pain or snoring.  Cardio: Denies chest pain, palpitations, irregular heartbeat, syncope, dyspnea, diaphoresis, orthopnea, PND, claudication or edema Respiratory: denies cough, dyspnea, DOE, pleurisy, hoarseness, laryngitis or wheezing.  Gastrointestinal: Denies dysphagia, heartburn, reflux, water brash, pain, cramps, nausea, vomiting, bloating, diarrhea, constipation, hematemesis, melena, hematochezia, jaundice or hemorrhoids Genitourinary: Denies dysuria, frequency, urgency, nocturia, hesitancy, discharge, hematuria or flank pain Musculoskeletal: Denies arthralgia, myalgia, stiffness, Jt. Swelling, pain, limp or strain/sprain. Denies Falls. Skin: Denies puritis, rash, hives, warts, acne, eczema or change in skin lesion Neuro: No weakness, tremor, incoordination, spasms, paresthesia or pain Psychiatric: Denies confusion, memory loss or sensory loss. Denies Depression. Endocrine: Denies change in weight, skin, hair change, nocturia, and paresthesia, diabetic polys, visual blurring or hyper / hypo glycemic episodes.  Heme/Lymph: No excessive bleeding, bruising or enlarged lymph nodes.  Physical Exam  BP 122/84   Pulse 100   Temp (!) 97.2 F (36.2 C)   Resp 18   Ht 5\' 11"  (1.803 m)   Wt 202 lb 12.8 oz (92 kg)   BMI 28.28 kg/m   General Appearance: Well nourished and well groomed and in no apparent distress.  Eyes: PERRLA, EOMs, conjunctiva no swelling or erythema, normal fundi and vessels. Sinuses:  No frontal/maxillary tenderness ENT/Mouth: EACs patent / TMs  nl. Nares clear without erythema, swelling, mucoid exudates. Oral hygiene is good. No erythema, swelling, or exudate. Tongue normal, non-obstructing. Tonsils not swollen or erythematous. Hearing normal.  Neck: Supple, thyroid normal. No bruits, nodes or JVD. Respiratory: Respiratory effort normal.  BS equal and clear bilateral without rales, rhonci, wheezing or stridor. Cardio: Heart sounds are normal with regular rate and rhythm and no murmurs, rubs or gallops. Peripheral pulses are normal and equal bilaterally without edema. No aortic or femoral bruits. Chest: symmetric with normal excursions and percussion.  Abdomen: Soft, with Nl bowel sounds. Nontender, no guarding, rebound, hernias, masses, or organomegaly.  Lymphatics: Non tender without lymphadenopathy.  DRE - deferred by patient. Musculoskeletal: Full ROM all peripheral extremities, joint stability, 5/5 strength, and normal gait. Skin: Warm and dry without rashes, lesions, cyanosis, clubbing or  ecchymosis.  Neuro: Cranial nerves intact, reflexes equal bilaterally. Normal muscle tone, no cerebellar symptoms. Sensation intact.  Pysch: Alert and oriented X 3 with normal affect, insight and judgment appropriate.  Assessment and Plan  1. Annual Preventative/Screening Exam   2. Essential hypertension  - EKG 12-Lead - Urinalysis, Routine w reflex microscopic - Microalbumin / creatinine urine ratio - CBC with Differential/Platelet - BASIC METABOLIC PANEL WITH GFR - Magnesium - TSH  3. Hyperlipidemia, mixed  - EKG 12-Lead - Hepatic function panel - Lipid panel - TSH  4. Abnormal glucose  - Hemoglobin A1c - Insulin, fasting  5. Vitamin D deficiency  - VITAMIN D 25 Hydroxy   6. Screening for colorectal cancer  - Discussed Cologard & recc contact his Insurance co to see if will cover Cologard.  - POC Hemoccult Bld/Stl   7. Prostate cancer screening  -  PSA  8. Screening for ischemic heart disease  - EKG 12-Lead  9. Screening examination for pulmonary tuberculosis   10. Fatigue  - Iron,Total/Total Iron Binding Cap - Vitamin B12 - Testosterone - TSH  11. Medication management  - Urinalysis, Routine w reflex microscopic - Microalbumin / creatinine urine ratio - CBC with Differential/Platelet - BASIC METABOLIC PANEL WITH GFR - Hepatic function panel - Magnesium - Lipid panel - TSH - Hemoglobin A1c - Insulin, fasting        Patient was counseled in prudent diet, weight control to achieve/maintain BMI less than 25, BP monitoring, regular exercise and medications as discussed.  Discussed med effects and SE's. Routine screening labs and tests as requested with regular follow-up as recommended. Over 40 minutes of exam, counseling, chart review and high complex critical decision making was performed

## 2016-09-10 ENCOUNTER — Ambulatory Visit (INDEPENDENT_AMBULATORY_CARE_PROVIDER_SITE_OTHER): Payer: 59 | Admitting: Internal Medicine

## 2016-09-10 ENCOUNTER — Encounter: Payer: Self-pay | Admitting: Internal Medicine

## 2016-09-10 VITALS — BP 122/84 | HR 100 | Temp 97.2°F | Resp 18 | Ht 71.0 in | Wt 202.8 lb

## 2016-09-10 DIAGNOSIS — E782 Mixed hyperlipidemia: Secondary | ICD-10-CM

## 2016-09-10 DIAGNOSIS — Z136 Encounter for screening for cardiovascular disorders: Secondary | ICD-10-CM

## 2016-09-10 DIAGNOSIS — Z125 Encounter for screening for malignant neoplasm of prostate: Secondary | ICD-10-CM

## 2016-09-10 DIAGNOSIS — I10 Essential (primary) hypertension: Secondary | ICD-10-CM

## 2016-09-10 DIAGNOSIS — R7309 Other abnormal glucose: Secondary | ICD-10-CM

## 2016-09-10 DIAGNOSIS — Z79899 Other long term (current) drug therapy: Secondary | ICD-10-CM

## 2016-09-10 DIAGNOSIS — Z111 Encounter for screening for respiratory tuberculosis: Secondary | ICD-10-CM | POA: Diagnosis not present

## 2016-09-10 DIAGNOSIS — E559 Vitamin D deficiency, unspecified: Secondary | ICD-10-CM

## 2016-09-10 DIAGNOSIS — Z1211 Encounter for screening for malignant neoplasm of colon: Secondary | ICD-10-CM

## 2016-09-10 DIAGNOSIS — R5383 Other fatigue: Secondary | ICD-10-CM

## 2016-09-10 DIAGNOSIS — Z0001 Encounter for general adult medical examination with abnormal findings: Secondary | ICD-10-CM

## 2016-09-10 DIAGNOSIS — Z0000 Encounter for general adult medical examination without abnormal findings: Secondary | ICD-10-CM

## 2016-09-10 DIAGNOSIS — Z1212 Encounter for screening for malignant neoplasm of rectum: Secondary | ICD-10-CM

## 2016-09-10 MED ORDER — ESOMEPRAZOLE MAGNESIUM 40 MG PO CPDR: 40.0000 mg | DELAYED_RELEASE_CAPSULE | Freq: Every day | ORAL | 3 refills | Status: DC

## 2016-09-10 NOTE — Patient Instructions (Signed)

## 2016-09-11 ENCOUNTER — Other Ambulatory Visit: Payer: Self-pay | Admitting: Internal Medicine

## 2016-09-11 LAB — CBC WITH DIFFERENTIAL/PLATELET
BASOS PCT: 0.5 %
Basophils Absolute: 39 cells/uL (ref 0–200)
EOS PCT: 2 %
Eosinophils Absolute: 156 cells/uL (ref 15–500)
HCT: 45.6 % (ref 38.5–50.0)
Hemoglobin: 16.5 g/dL (ref 13.2–17.1)
Lymphs Abs: 1490 cells/uL (ref 850–3900)
MCH: 34.1 pg — ABNORMAL HIGH (ref 27.0–33.0)
MCHC: 36.2 g/dL — ABNORMAL HIGH (ref 32.0–36.0)
MCV: 94.2 fL (ref 80.0–100.0)
MPV: 10.4 fL (ref 7.5–12.5)
Monocytes Relative: 8.4 %
Neutro Abs: 5460 cells/uL (ref 1500–7800)
Neutrophils Relative %: 70 %
PLATELETS: 266 10*3/uL (ref 140–400)
RBC: 4.84 10*6/uL (ref 4.20–5.80)
RDW: 13.7 % (ref 11.0–15.0)
TOTAL LYMPHOCYTE: 19.1 %
WBC mixed population: 655 cells/uL (ref 200–950)
WBC: 7.8 10*3/uL (ref 3.8–10.8)

## 2016-09-11 LAB — URINALYSIS, ROUTINE W REFLEX MICROSCOPIC
BILIRUBIN URINE: NEGATIVE
Bacteria, UA: NONE SEEN /HPF
Glucose, UA: NEGATIVE
Hyaline Cast: NONE SEEN /LPF
Ketones, ur: NEGATIVE
LEUKOCYTES UA: NEGATIVE
NITRITE: NEGATIVE
RBC / HPF: NONE SEEN /HPF (ref 0–2)
Specific Gravity, Urine: 1.011 (ref 1.001–1.03)
Squamous Epithelial / LPF: NONE SEEN /HPF (ref <=5)
WBC UA: NONE SEEN /HPF (ref 0–5)
pH: 5.5 (ref 5.0–8.0)

## 2016-09-11 LAB — BASIC METABOLIC PANEL WITH GFR
BUN: 15 mg/dL (ref 7–25)
CALCIUM: 9.6 mg/dL (ref 8.6–10.3)
CO2: 27 mmol/L (ref 20–32)
Chloride: 95 mmol/L — ABNORMAL LOW (ref 98–110)
Creat: 0.98 mg/dL (ref 0.60–1.35)
GFR, EST NON AFRICAN AMERICAN: 91 mL/min/{1.73_m2} (ref >=60)
GFR, Est African American: 105 mL/min/{1.73_m2} (ref >=60)
Glucose, Bld: 96 mg/dL (ref 65–99)
POTASSIUM: 4 mmol/L (ref 3.5–5.3)
Sodium: 134 mmol/L — ABNORMAL LOW (ref 135–146)

## 2016-09-11 LAB — HEMOGLOBIN A1C
EAG (MMOL/L): 6.3 (calc)
HEMOGLOBIN A1C: 5.6 %{Hb} (ref <5.7)
MEAN PLASMA GLUCOSE: 114 (calc)

## 2016-09-11 LAB — HEPATIC FUNCTION PANEL
AG Ratio: 1.6 (calc) (ref 1.0–2.5)
ALKALINE PHOSPHATASE (APISO): 67 U/L (ref 40–115)
ALT: 92 U/L — AB (ref 9–46)
AST: 76 U/L — AB (ref 10–40)
Albumin: 4.7 g/dL (ref 3.6–5.1)
BILIRUBIN DIRECT: 0.2 mg/dL (ref 0.0–0.2)
BILIRUBIN INDIRECT: 0.5 mg/dL (ref 0.2–1.2)
BILIRUBIN TOTAL: 0.7 mg/dL (ref 0.2–1.2)
Globulin: 3 g/dL (calc) (ref 1.9–3.7)
Total Protein: 7.7 g/dL (ref 6.1–8.1)

## 2016-09-11 LAB — INSULIN, RANDOM: Insulin: 8.1 u[IU]/mL (ref 2.0–19.6)

## 2016-09-11 LAB — MICROALBUMIN / CREATININE URINE RATIO
CREATININE, URINE: 78 mg/dL (ref 20–320)
MICROALB UR: 20.4 mg/dL
MICROALB/CREAT RATIO: 262 ug/mg{creat} — AB (ref <30)

## 2016-09-11 LAB — VITAMIN D 25 HYDROXY (VIT D DEFICIENCY, FRACTURES): Vit D, 25-Hydroxy: 82 ng/mL (ref 30–100)

## 2016-09-11 LAB — LIPID PANEL
Cholesterol: 155 mg/dL (ref <200)
HDL: 68 mg/dL (ref >40)
LDL Cholesterol (Calc): 67 mg/dL (calc)
NON-HDL CHOLESTEROL (CALC): 87 mg/dL (ref <130)
Total CHOL/HDL Ratio: 2.3 (calc) (ref <5.0)
Triglycerides: 117 mg/dL (ref <150)

## 2016-09-11 LAB — IRON, TOTAL/TOTAL IRON BINDING CAP
%SAT: 42 % (calc) (ref 15–60)
IRON: 138 ug/dL (ref 50–180)
TIBC: 330 mcg/dL (calc) (ref 250–425)

## 2016-09-11 LAB — TSH: TSH: 1.09 m[IU]/L (ref 0.40–4.50)

## 2016-09-11 LAB — TESTOSTERONE: Testosterone: 171 ng/dL — ABNORMAL LOW (ref 250–827)

## 2016-09-11 LAB — PSA: PSA: 2.7 ng/mL (ref <=4.0)

## 2016-09-11 LAB — VITAMIN B12: Vitamin B-12: 416 pg/mL (ref 200–1100)

## 2016-09-11 LAB — MAGNESIUM: Magnesium: 2 mg/dL (ref 1.5–2.5)

## 2016-09-11 MED ORDER — ESOMEPRAZOLE MAGNESIUM 40 MG PO CPDR: 40.0000 mg | DELAYED_RELEASE_CAPSULE | Freq: Every day | ORAL | 3 refills | Status: DC

## 2016-09-12 ENCOUNTER — Other Ambulatory Visit: Payer: Self-pay | Admitting: Internal Medicine

## 2016-09-12 DIAGNOSIS — K219 Gastro-esophageal reflux disease without esophagitis: Secondary | ICD-10-CM | POA: Insufficient documentation

## 2016-09-12 MED ORDER — PANTOPRAZOLE SODIUM 40 MG PO TBEC: DELAYED_RELEASE_TABLET | ORAL | 3 refills | Status: DC

## 2016-09-13 ENCOUNTER — Other Ambulatory Visit: Payer: Self-pay | Admitting: *Deleted

## 2016-09-13 DIAGNOSIS — K219 Gastro-esophageal reflux disease without esophagitis: Secondary | ICD-10-CM

## 2016-09-13 MED ORDER — PANTOPRAZOLE SODIUM 40 MG PO TBEC: DELAYED_RELEASE_TABLET | ORAL | 3 refills | Status: DC

## 2016-09-24 ENCOUNTER — Ambulatory Visit: Payer: Self-pay | Admitting: Physician Assistant

## 2016-10-22 ENCOUNTER — Other Ambulatory Visit: Payer: Self-pay | Admitting: *Deleted

## 2016-10-22 MED ORDER — PREDNISONE 20 MG PO TABS: ORAL_TABLET | ORAL | 0 refills | Status: DC

## 2016-10-24 ENCOUNTER — Other Ambulatory Visit: Payer: Self-pay | Admitting: Internal Medicine

## 2016-11-06 ENCOUNTER — Other Ambulatory Visit: Payer: Self-pay | Admitting: *Deleted

## 2016-11-06 DIAGNOSIS — K120 Recurrent oral aphthae: Secondary | ICD-10-CM

## 2016-11-06 MED ORDER — ACYCLOVIR 800 MG PO TABS: ORAL_TABLET | ORAL | 3 refills | Status: DC

## 2016-11-27 ENCOUNTER — Other Ambulatory Visit: Payer: Self-pay | Admitting: *Deleted

## 2016-11-27 MED ORDER — HYDROCHLOROTHIAZIDE 25 MG PO TABS: ORAL_TABLET | ORAL | 1 refills | Status: DC

## 2016-11-29 ENCOUNTER — Other Ambulatory Visit: Payer: Self-pay | Admitting: *Deleted

## 2016-11-29 MED ORDER — HYDROCHLOROTHIAZIDE 25 MG PO TABS: ORAL_TABLET | ORAL | 1 refills | Status: DC

## 2016-12-28 DIAGNOSIS — F418 Other specified anxiety disorders: Secondary | ICD-10-CM | POA: Insufficient documentation

## 2016-12-28 DIAGNOSIS — F419 Anxiety disorder, unspecified: Secondary | ICD-10-CM | POA: Insufficient documentation

## 2016-12-28 NOTE — Progress Notes (Deleted)
FOLLOW UP  Assessment and Plan:   Hypertension Well controlled with current medications  Monitor blood pressure at home; patient to call if consistently greater than 130/80 Continue DASH diet.   Reminder to go to the ER if any CP, SOB, nausea, dizziness, severe HA, changes vision/speech, left arm numbness and tingling and jaw pain.  Cholesterol Currently at goal; continue lifestyle and high dose statin Continue low cholesterol diet and exercise.  Check lipid panel.   Abnormal glucose Discussed disease and risks of elevated glucose Discussed diet/exercise, weight management  A1C  GERD Well managed on current medications Discussed diet, avoiding triggers and other lifestyle changes  Overweight Long discussion about weight loss, diet, and exercise Recommended diet heavy in fruits and veggies and low in animal meats, cheeses, and dairy products, appropriate calorie intake Patient will work on *** Discussed appropriate weight for height (below***) and initial goal (***) Follow up in 3 months  Anxiety Well managed by current regimen; continue medications*** Stress management techniques discussed, increase water, good sleep hygiene discussed, increase exercise, and increase veggies.   Vitamin D Def At goal at last visit; continue supplementation to maintain goal of 70-100 Defer Vit D level  Continue diet and meds as discussed. Further disposition pending results of labs. Discussed med's effects and SE's.   Over 30 minutes of exam, counseling, chart review, and critical decision making was performed.   Future Appointments  Date Time Provider [place]  12/31/2016  8:45 AM Liane Comber, NP [facility] None  04/01/2017  9:30 AM Unk Pinto, MD [facility] None  10/14/2017 10:00 AM Unk Pinto, MD [facility] None    ----------------------------------------------------------------------------------------------------------------------  HPI 51 y.o. male   presents for 3 month follow up on hypertension, cholesterol, GERD, anxiety, glucose management, weight and vitamin D deficiency.   he has a diagnosis of anxiety and is currently on ***, reports symptoms are*** well controlled on current regimen. He has previously been prescribed citalopram which was poorly tolerated***  BMI is There is no height or weight on file to calculate BMI., he {HAS HAS WNI:62703} been working on diet and exercise. Wt Readings from Last 3 Encounters:  09/10/16 202 lb 12.8 oz (92 kg)  02/27/16 208 lb (94.3 kg)  12/16/15 206 lb (93.4 kg)   he has a diagnosis of GERD which is currently managed by *** he reports symptoms {ACTION; ARE/ARE JKK:[id number]} currently well controlled, and denies breakthrough reflux, burning in chest, hoarseness or cough.    His blood pressure {HAS HAS NOT:18834} been controlled at home, today their BP is    He {DOES_DOES BZJ:69678} workout. He denies chest pain, shortness of breath, dizziness.   He is on cholesterol medication and denies myalgias. His cholesterol is at goal. The cholesterol last visit was:   Lab Results  Component Value Date   CHOL 155 09/10/2016   HDL 68 09/10/2016   LDLCALC 66 02/27/2016   TRIG 117 09/10/2016   CHOLHDL 2.3 09/10/2016    He {Has/has not:18111} been working on diet and exercise for glucose management, and denies {Symptoms; diabetes w/o none:19199}. Last A1C in the office was:  Lab Results  Component Value Date   HGBA1C 5.6 09/10/2016   Patient is on Vitamin D supplement and at goal:    Lab Results  Component Value Date   VD25OH 82 09/10/2016        Current Medications:  Current Outpatient Medications on File Prior to Visit  Medication Sig  . acyclovir (ZOVIRAX) 800 MG tablet TAKE 1 TABLET  DAILY FOR FEVER BLISTERS  . ALPRAZolam (XANAX) 1 MG tablet TAKE 1/2 TO 1 TABLET BY MOUTH 3 TIMES A DAY AS NEEDED FOR ANXIETY  . atorvastatin (LIPITOR) 80 MG tablet TAKE 1 TABLET BY MOUTH EVERY DAY AS DIRECTED  FOR CHOLESTEROL  . Cholecalciferol (VITAMIN D3) 400 units CAPS Take 5,000 Units by mouth daily.   . enalapril (VASOTEC) 20 MG tablet TAKE 1 TABLET DAILY FOR BLOOD PRESSURE  . hydrochlorothiazide (HYDRODIURIL) 25 MG tablet TAKE 1 TABLET BY MOUTH EVERY DAY FOR BLOOD PRESSURE AND FLUID  . ibuprofen (ADVIL,MOTRIN) 200 MG tablet Take by mouth.  . loratadine-pseudoephedrine (CLARITIN-D 24 HOUR) 10-240 MG 24 hr tablet Take 1 tablet by mouth daily.  [place] OVER THE COUNTER MEDICATION Claritin-D 24 hour 1 daily.  . pantoprazole (PROTONIX) 40 MG tablet Take 1 tablet daily for Acid Indigestion & Reflux  . predniSONE (DELTASONE) 20 MG tablet Take 1 tablet tid x 3 days, 1 tablet bid x 3 days and then 1 qd x 5.  . ranitidine (ZANTAC) 300 MG tablet Take 1 to 2 tablets daily for heartburn & reflux   No current facility-administered medications on file prior to visit.      Allergies:  Allergies  Allergen Reactions  . Citalopram     dyphoria     Medical History:  Past Medical History:  Diagnosis Date  . Anxiety   . Encounter for long-term (current) use of other medications   . Hyperlipidemia   . Hypertension   . Hypogonadism male   . Vitamin D deficiency    Family history- Reviewed and unchanged Social history- Reviewed and unchanged   Review of Systems:  ROS    Physical Exam: There were no vitals taken for this visit. Wt Readings from Last 3 Encounters:  09/10/16 202 lb 12.8 oz (92 kg)  02/27/16 208 lb (94.3 kg)  12/16/15 206 lb (93.4 kg)   General Appearance: Well nourished, in no apparent distress. Eyes: PERRLA, EOMs, conjunctiva no swelling or erythema Sinuses: No Frontal/maxillary tenderness ENT/Mouth: Ext aud canals clear, TMs without erythema, bulging. No erythema, swelling, or exudate on post pharynx.  Tonsils not swollen or erythematous. Hearing normal.  Neck: Supple, thyroid normal.  Respiratory: Respiratory effort normal, BS equal bilaterally without rales, rhonchi, wheezing  or stridor.  Cardio: RRR with no MRGs. Brisk peripheral pulses without edema.  Abdomen: Soft, + BS.  Non tender, no guarding, rebound, hernias, masses. Lymphatics: Non tender without lymphadenopathy.  Musculoskeletal: Full ROM, 5/5 strength, {PSY - GAIT AND STATION:22860} gait Skin: Warm, dry without rashes, lesions, ecchymosis.  Neuro: Cranial nerves intact. No cerebellar symptoms.  Psych: Awake and oriented X 3, normal affect, Insight and Judgment appropriate.    Izora Ribas, NP 8:39 AM [facility]

## 2016-12-31 ENCOUNTER — Ambulatory Visit: Payer: Self-pay | Admitting: Adult Health

## 2017-01-08 ENCOUNTER — Other Ambulatory Visit: Payer: Self-pay | Admitting: *Deleted

## 2017-01-08 MED ORDER — ENALAPRIL MALEATE 20 MG PO TABS: 20.0000 mg | ORAL_TABLET | Freq: Every day | ORAL | 1 refills | Status: DC

## 2017-01-09 ENCOUNTER — Other Ambulatory Visit: Payer: Self-pay | Admitting: Internal Medicine

## 2017-01-09 MED ORDER — ENALAPRIL MALEATE 20 MG PO TABS: 20.0000 mg | ORAL_TABLET | Freq: Every day | ORAL | 1 refills | Status: DC

## 2017-01-27 NOTE — Progress Notes (Signed)
FOLLOW UP  Assessment and Plan:   Hypertension Well controlled with current medications  Monitor blood pressure at home; patient to call if consistently greater than 130/80 Continue DASH diet.   Reminder to go to the ER if any CP, SOB, nausea, dizziness, severe HA, changes vision/speech, left arm numbness and tingling and jaw pain.  Cholesterol Currently at goal; continue atorvastatin 80 mg daily Continue low cholesterol diet and exercise.  Check lipid panel.   Other abnormal glucose Continue diet and exercise, discussed ideal weight - A1Cs trending up  Perform daily foot/skin check, notify office of any concerning changes.  Check A1C  GERD Well managed on current medications Discussed diet, avoiding triggers and other lifestyle changes  Overweight Long discussion about weight loss, diet, and exercise Recommended diet heavy in fruits and veggies and low in animal meats, cheeses, and dairy products, appropriate calorie intake Discussed ideal weight for height Will follow up in 3 months  Vitamin D Def At goal at last visit; continue supplementation to maintain goal of 70-100 Defer Vit D level  Anxiety Well managed by current regimen; he reports daytime stress is fairly managed and wants to avoid a daily medication. continue PRN medications Stress management techniques discussed, increase water, good sleep hygiene discussed, increase exercise, and increase veggies.   Gout Several flares over past 1-2 years; not on allopurinol - uric acid has never been checked.  Uric acid level today, will initiate allopurinol with colchicine bridge and then PRN to replace prednisone - ?causing a cushinoid reaction - patient to monitor and report if not resolving or any new concerns  Continue diet and meds as discussed. Further disposition pending results of labs. Discussed med's effects and SE's.   Over 30 minutes of exam, counseling, chart review, and critical decision making was  performed.   Future Appointments  Date Time Provider [place]  04/01/2017  9:30 AM Unk Pinto, MD [facility] None  10/14/2017 10:00 AM Unk Pinto, MD [facility] None    ----------------------------------------------------------------------------------------------------------------------  HPI 52 y.o. male  presents for 3 month follow up on hypertension, cholesterol, prediabetes, GERD, anxiety, weight and vitamin D deficiency. The patient reports several gout flares (typically to L toe) over the past year which he has been taking prednisone to treat. Most recent was 1 month ago and required longer course than previous. Since then, he reports he has noted his face and neck are enlarged - has been unable to close top button on dress shirts. He denies other remarkable weight gain, cough, difficulty swallowing, hoarseness, pain. Face is not obviously swollen/enlarged to provider today.   he has a diagnosis of anxiety and is currently on xanax 1 mg TID PRN - he currently typically takes 1 tab at night for sleep after stressful day at work, uses very rarely during the day - previously treated by [place] with severe SE, reports symptoms are well controlled on current regimen.   he has a diagnosis of GERD which is currently managed by PRN ranitidine he reports symptoms is currently well controlled, and denies breakthrough reflux, burning in chest, hoarseness or cough.    BMI is Body mass index is 28.31 kg/m., he has not been working on diet and exercise. Wt Readings from Last 3 Encounters:  01/28/17 203 lb (92.1 kg)  09/10/16 202 lb 12.8 oz (92 kg)  02/27/16 208 lb (94.3 kg)   His blood pressure has been controlled at home, today their BP is BP: 110/88  He does not workout. He denies chest  pain, shortness of breath, dizziness.   He is on cholesterol medication and denies myalgias. His cholesterol is at goal. The cholesterol last visit was:   Lab Results  Component Value Date    CHOL 155 09/10/2016   HDL 68 09/10/2016   LDLCALC 66 02/27/2016   TRIG 117 09/10/2016   CHOLHDL 2.3 09/10/2016    He has not been working on diet and exercise for prediabetes, and denies increased appetite, nausea, paresthesia of the feet, polydipsia, polyuria, visual disturbances and vomiting. Last A1C in the office was:  Lab Results  Component Value Date   HGBA1C 5.6 09/10/2016   Patient is on Vitamin D supplement and at goal at recent check:    Lab Results  Component Value Date   VD25OH 82 09/10/2016        Current Medications:  Current Outpatient Medications on File Prior to Visit  Medication Sig  . acyclovir (ZOVIRAX) 800 MG tablet TAKE 1 TABLET DAILY FOR FEVER BLISTERS  . ALPRAZolam (XANAX) 1 MG tablet TAKE 1/2 TO 1 TABLET BY MOUTH 3 TIMES A DAY AS NEEDED FOR ANXIETY  . atorvastatin (LIPITOR) 80 MG tablet TAKE 1 TABLET BY MOUTH EVERY DAY AS DIRECTED FOR CHOLESTEROL  . Cholecalciferol (VITAMIN D3) 400 units CAPS Take 5,000 Units by mouth daily.   . enalapril (VASOTEC) 20 MG tablet Take 1 tablet (20 mg total) by mouth daily. for blood pressure  . hydrochlorothiazide (HYDRODIURIL) 25 MG tablet TAKE 1 TABLET BY MOUTH EVERY DAY FOR BLOOD PRESSURE AND FLUID  . ibuprofen (ADVIL,MOTRIN) 200 MG tablet Take by mouth.  . loratadine-pseudoephedrine (CLARITIN-D 24 HOUR) 10-240 MG 24 hr tablet Take 1 tablet by mouth daily.  . predniSONE (DELTASONE) 20 MG tablet Take 1 tablet tid x 3 days, 1 tablet bid x 3 days and then 1 qd x 5.  . OVER THE COUNTER MEDICATION Claritin-D 24 hour 1 daily.  . pantoprazole (PROTONIX) 40 MG tablet Take 1 tablet daily for Acid Indigestion & Reflux (Patient not taking: Reported on 01/28/2017)  . ranitidine (ZANTAC) 300 MG tablet Take 1 to 2 tablets daily for heartburn & reflux   No current facility-administered medications on file prior to visit.      Allergies:  Allergies  Allergen Reactions  . Citalopram     dyphoria     Medical History:  Past  Medical History:  Diagnosis Date  . Anxiety   . Encounter for long-term (current) use of other medications   . Hyperlipidemia   . Hypertension   . Hypogonadism male   . Vitamin D deficiency    Family history- Reviewed and unchanged Social history- Reviewed and unchanged   Review of Systems:  ROS    Physical Exam: BP 110/88   Pulse (!) 106   Temp (!) 97.5 F (36.4 C)   Ht 5\' 11"  (1.803 m)   Wt 203 lb (92.1 kg)   SpO2 95%   BMI 28.31 kg/m  Wt Readings from Last 3 Encounters:  01/28/17 203 lb (92.1 kg)  09/10/16 202 lb 12.8 oz (92 kg)  02/27/16 208 lb (94.3 kg)   General Appearance: Well nourished, in no apparent distress. Eyes: PERRLA, EOMs, conjunctiva no swelling or erythema Sinuses: No Frontal/maxillary tenderness ENT/Mouth: Ext aud canals clear, TMs without erythema, bulging. No erythema, swelling, or exudate on post pharynx.  Tonsils not swollen or erythematous. Hearing normal.  Neck: Supple, thyroid normal.  Respiratory: Respiratory effort normal, BS equal bilaterally without rales, rhonchi, wheezing or stridor.  Cardio:  RRR with no MRGs. Brisk peripheral pulses without edema.  Abdomen: Soft, + BS.  Non tender, no guarding, rebound, hernias, masses. Lymphatics: Non tender without lymphadenopathy.  Musculoskeletal: Full ROM, 5/5 strength, Normal gait Skin: Warm, dry without rashes, lesions, ecchymosis.  Neuro: Cranial nerves intact. No cerebellar symptoms.  Psych: Awake and oriented X 3, normal affect, Insight and Judgment appropriate.    Izora Ribas, NP 10:46 AM [facility]

## 2017-01-28 ENCOUNTER — Ambulatory Visit: Payer: 59 | Admitting: Adult Health

## 2017-01-28 ENCOUNTER — Encounter: Payer: Self-pay | Admitting: Adult Health

## 2017-01-28 VITALS — BP 110/88 | HR 106 | Temp 97.5°F | Ht 71.0 in | Wt 203.0 lb

## 2017-01-28 DIAGNOSIS — F419 Anxiety disorder, unspecified: Secondary | ICD-10-CM

## 2017-01-28 DIAGNOSIS — M1A9XX0 Chronic gout, unspecified, without tophus (tophi): Secondary | ICD-10-CM | POA: Insufficient documentation

## 2017-01-28 DIAGNOSIS — R7309 Other abnormal glucose: Secondary | ICD-10-CM | POA: Diagnosis not present

## 2017-01-28 DIAGNOSIS — Z79899 Other long term (current) drug therapy: Secondary | ICD-10-CM

## 2017-01-28 DIAGNOSIS — K219 Gastro-esophageal reflux disease without esophagitis: Secondary | ICD-10-CM | POA: Diagnosis not present

## 2017-01-28 DIAGNOSIS — E559 Vitamin D deficiency, unspecified: Secondary | ICD-10-CM

## 2017-01-28 DIAGNOSIS — I10 Essential (primary) hypertension: Secondary | ICD-10-CM | POA: Diagnosis not present

## 2017-01-28 DIAGNOSIS — E663 Overweight: Secondary | ICD-10-CM | POA: Diagnosis not present

## 2017-01-28 DIAGNOSIS — E782 Mixed hyperlipidemia: Secondary | ICD-10-CM | POA: Diagnosis not present

## 2017-01-28 MED ORDER — COLCHICINE 0.6 MG PO TABS: ORAL_TABLET | ORAL | 2 refills | Status: DC

## 2017-01-28 MED ORDER — ALLOPURINOL 300 MG PO TABS: 300.0000 mg | ORAL_TABLET | Freq: Every day | ORAL | 1 refills | Status: DC

## 2017-01-28 NOTE — Patient Instructions (Signed)
You can start on allopurinol 300mg , start on 1/2 pill a day This can sometime CAUSE a gout flare so I'm send in colchcine for you to take twice a day for 1 week    Your facial swelling could be a brief cushinoid reaction to higher doses of prednisone   Information for patients with Gout  Gout defined-Gout occurs when urate crystals accumulate in your joint causing the inflammation and intense pain of gout attack.  Urate crystals can form when you have high levels of uric acid in your blood.  Your body produces uric acid when it breaks down prurines-substances that are found naturally in your body, as well as in certain foods such as organ meats, anchioves, herring, asparagus, and mushrooms.  Normally uric acid dissolves in your blood and passes through your kidneys into your urine.  But sometimes your body either produces too much uric acid or your kidneys excrete too little uric acid.  When this happens, uric acid can build up, forming sharp needle-like urate crystals in a joint or surrounding tissue that cause pain, inflammation and swelling.    Gout is characterized by sudden, severe attacks of pain, redness and tenderness in joints, often the joint at the base of the big toe.  Gout is complex form of arthritis that can affect anyone.  Men are more likely to get gout but women become increasingly more susceptible to gout after menopause.  An acute attack of gout can wake you up in the middle of the night with the sensation that your big toe is on fire.  The affected joint is hot, swollen and so tender that even the weight or the sheet on it may seem intolerable.  If you experience symptoms of an acute gout attack it is important to your doctor as soon as the symptoms start.  Gout that goes untreated can lead to worsening pain and joint damage.  Risk Factors:  You are more likely to develop gout if you have high levels of uric acid in your body.    Factors that increase the uric acid level  in your body include:  Lifestyle factors.  Excessive alcohol use-generally more than two drinks a day for men and more than one for women increase the risk of gout.  Medical conditions.  Certain conditions make it more likely that you will develop gout.  These include hypertension, and chronic conditions such as diabetes, high levels of fat and cholesterol in the blood, and narrowing of the arteries.  Certain medications.  The uses of Thiazide diuretics- commonly used to treat hypertension and low dose aspirin can also increase uric acid levels.  Family history of gout.  If other members of your family have had gout, you are more likely to develop the disease.  Age and sex. Gout occurs more often in men than it does in women, primarily because women tend to have lower uric acid levels than men do.  Men are more likely to develop gout earlier usually between the ages of 70-50- whereas women generally develop signs and symptoms after menopause.    Tests and diagnosis:  Tests to help diagnose gout may include:  Blood test.  Your doctor may recommend a blood test to measure the uric acid level in your blood .  Blood tests can be misleading, though.  Some people have high uric acid levels but never experience gout.  And some people have signs and symptoms of gout, but don't have unusual levels of uric acid  in their blood.  Joint fluid test.  Your doctor may use a needle to draw fluid from your affected joint.  When examined under the microscope, your joint fluid may reveal urate crystals.  Treatment:  Treatment for gout usually involves medications.  What medications you and your doctor choose will be based on your current health and other medications you currently take.  Gout medications can be used to treat acute gout attacks and prevent future attacks as well as reduce your risk of complications from gout such as the development of tophi from urate crystal deposits.  Alternative medicine:    Certain foods have been studied for their potential to lower uric acid levels, including:  Coffee.  Studies have found an association between coffee drinking (regular and decaf) and lower uric acid levels.  The evidence is not enough to encourage non-coffee drinkers to start, but it may give clues to new ways of treating gout in the future.  Vitamin C.  Supplements containing vitamin C may reduce the levels of uric acid in your blood.  However, vitamin as a treatment for gout. Don't assume that if a little vitamin C is good, than lots is better.  Megadoses of vitamin C may increase your bodies uric acid levels.  Cherries.  Cherries have been associated with lower levels of uric acid in studies, but it isn't clear if they have any effect on gout signs and symptoms.  Eating more cherries and other dar-colored fruits, such as blackberries, blueberries, purple grapes and raspberries, may be a safe way to support your gout treatment.    Lifestyle/Diet Recommendations:   Drink 8 to 16 cups ( about 2 to 4 liters) of fluid each day, with at least half being water.  Avoid alcohol  Eat a moderate amount of protein, preferably from healthy sources, such as low-fat or fat-free dairy, tofu, eggs, and nut butters.  Limit you daily intake of meat, fish, and poultry to 4 to 6 ounces.  Avoid high fat meats and desserts.  Decrease you intake of shellfish, beef, lamb, pork, eggs and cheese.  Choose a good source of vitamin C daily such as citrus fruits, strawberries, broccoli,  brussel sprouts, papaya, and cantaloupe.   Choose a good source of vitamin A every other day such as yellow fruits, or dark green/yellow vegetables.  Avoid drastic weigh reduction or fasting.  If weigh loss is desired lose it over a period of several months.  See "dietary considerations.." chart for specific food recommendations.  Dietary Considerations for people with Gout  Food with negligible purine content (0-15 mg of  purine nitrogen per 100 grams food)  May use as desired except on calorie variations  Non fat milk Cocoa Cereals (except in list II) Hard candies  Buttermilk Carbonated drinks Vegetables (except in list II) Sherbet  Coffee Fruits Sugar Honey  Tea Cottage Cheese Gelatin-jell-o Salt  Fruit juice Breads Angel food Cake   Herbs/spices Jams/Jellies [employer] [place] [employer]    Foods that do not contain excessive purine content, but must be limited due to fat content  Cream Eggs Oil and Salad Dressing  Half and Half Peanut Butter Chocolate  Whole Milk Cakes Potato Chips  Butter Ice Cream Fried Foods  Cheese Nuts Waffles, pancakes   List II: Food with moderate purine content (50-150 mg of purine nitrogen per 100 grams of food)  Limit total amount each day to 5 oz. cooked Lean meat, other than those on list III   Poultry, other than  those on list III Fish, other than those on list III   Seafood, other than those on list III  These foods may be used occasionally  Peas Lentils Bran  Spinach Oatmeal Dried Beans and Peas  Asparagus Wheat Germ Mushrooms   Additional information about meat choices  Choose fish and poultry, particularly without skin, often.  Select lean, well trimmed cuts of meat.  Avoid all fatty meats, bacon , sausage, fried meats, fried fish, or poultry, luncheon meats, cold cuts, hot dogs, meats canned or frozen in gravy, spareribs and frozen and packaged prepared meats.   List III: Foods with HIGH purine content / Foods to AVOID (150-800 mg of purine nitrogen per 100 grams of food)  Anchovies Herring Meat Broths  Liver Mackerel Meat Extracts  Kidney Scallops Meat Drippings  Sardines Wild Game Mincemeat  Sweetbreads Goose Gravy  Heart Tongue Yeast, baker's and brewers   Commercial soups made with any of the foods listed in List II or List III  In addition avoid all alcoholic beverages

## 2017-01-29 LAB — CBC WITH DIFFERENTIAL/PLATELET
BASOS ABS: 31 {cells}/uL (ref 0–200)
Basophils Relative: 0.5 %
EOS ABS: 262 {cells}/uL (ref 15–500)
Eosinophils Relative: 4.3 %
HCT: 45.2 % (ref 38.5–50.0)
Hemoglobin: 16 g/dL (ref 13.2–17.1)
Lymphs Abs: 1238 cells/uL (ref 850–3900)
MCH: 34 pg — AB (ref 27.0–33.0)
MCHC: 35.4 g/dL (ref 32.0–36.0)
MCV: 96 fL (ref 80.0–100.0)
MPV: 10.3 fL (ref 7.5–12.5)
Monocytes Relative: 8.7 %
NEUTROS PCT: 66.2 %
Neutro Abs: 4038 cells/uL (ref 1500–7800)
PLATELETS: 210 10*3/uL (ref 140–400)
RBC: 4.71 10*6/uL (ref 4.20–5.80)
RDW: 13 % (ref 11.0–15.0)
TOTAL LYMPHOCYTE: 20.3 %
WBC: 6.1 10*3/uL (ref 3.8–10.8)
WBCMIX: 531 {cells}/uL (ref 200–950)

## 2017-01-29 LAB — LIPID PANEL
Cholesterol: 188 mg/dL (ref <200)
HDL: 74 mg/dL (ref >40)
LDL CHOLESTEROL (CALC): 91 mg/dL
NON-HDL CHOLESTEROL (CALC): 114 mg/dL (ref <130)
TRIGLYCERIDES: 125 mg/dL (ref <150)
Total CHOL/HDL Ratio: 2.5 (calc) (ref <5.0)

## 2017-01-29 LAB — BASIC METABOLIC PANEL WITH GFR
BUN: 15 mg/dL (ref 7–25)
CO2: 33 mmol/L — AB (ref 20–32)
CREATININE: 1.27 mg/dL (ref 0.60–1.35)
Calcium: 9.7 mg/dL (ref 8.6–10.3)
Chloride: 97 mmol/L — ABNORMAL LOW (ref 98–110)
GFR, EST NON AFRICAN AMERICAN: 66 mL/min/{1.73_m2} (ref >=60)
GFR, Est African American: 76 mL/min/{1.73_m2} (ref >=60)
GLUCOSE: 116 mg/dL — AB (ref 65–99)
Potassium: 4.1 mmol/L (ref 3.5–5.3)
SODIUM: 137 mmol/L (ref 135–146)

## 2017-01-29 LAB — HEPATIC FUNCTION PANEL
AG Ratio: 1.4 (calc) (ref 1.0–2.5)
ALBUMIN MSPROF: 4 g/dL (ref 3.6–5.1)
ALT: 61 U/L — AB (ref 9–46)
AST: 47 U/L — ABNORMAL HIGH (ref 10–40)
Alkaline phosphatase (APISO): 50 U/L (ref 40–115)
BILIRUBIN TOTAL: 0.8 mg/dL (ref 0.2–1.2)
Bilirubin, Direct: 0.2 mg/dL (ref 0.0–0.2)
GLOBULIN: 2.8 g/dL (ref 1.9–3.7)
Indirect Bilirubin: 0.6 mg/dL (calc) (ref 0.2–1.2)
TOTAL PROTEIN: 6.8 g/dL (ref 6.1–8.1)

## 2017-01-29 LAB — HEMOGLOBIN A1C
Hgb A1c MFr Bld: 5.9 % of total Hgb — ABNORMAL HIGH (ref <5.7)
Mean Plasma Glucose: 123 (calc)
eAG (mmol/L): 6.8 (calc)

## 2017-01-29 LAB — URIC ACID: URIC ACID, SERUM: 9.3 mg/dL — AB (ref 4.0–8.0)

## 2017-01-29 LAB — TSH: TSH: 1.94 mIU/L (ref 0.40–4.50)

## 2017-02-07 ENCOUNTER — Other Ambulatory Visit: Payer: Self-pay

## 2017-02-07 DIAGNOSIS — M1A9XX0 Chronic gout, unspecified, without tophus (tophi): Secondary | ICD-10-CM

## 2017-02-07 MED ORDER — ALLOPURINOL 300 MG PO TABS: 300.0000 mg | ORAL_TABLET | Freq: Every day | ORAL | 1 refills | Status: DC

## 2017-02-07 MED ORDER — COLCHICINE 0.6 MG PO TABS: ORAL_TABLET | ORAL | 2 refills | Status: DC

## 2017-03-29 ENCOUNTER — Encounter: Payer: Self-pay | Admitting: Internal Medicine

## 2017-04-01 ENCOUNTER — Ambulatory Visit: Payer: Self-pay | Admitting: Internal Medicine

## 2017-04-18 ENCOUNTER — Other Ambulatory Visit: Payer: Self-pay | Admitting: Internal Medicine

## 2017-04-18 MED ORDER — PREDNISONE 20 MG PO TABS: ORAL_TABLET | ORAL | 0 refills | Status: DC

## 2017-04-18 MED ORDER — AZITHROMYCIN 250 MG PO TABS: ORAL_TABLET | ORAL | 1 refills | Status: DC

## 2017-05-01 ENCOUNTER — Other Ambulatory Visit: Payer: Self-pay | Admitting: Internal Medicine

## 2017-05-01 MED ORDER — ALPRAZOLAM 1 MG PO TABS: ORAL_TABLET | ORAL | 0 refills | Status: DC

## 2017-05-15 ENCOUNTER — Other Ambulatory Visit: Payer: Self-pay | Admitting: Internal Medicine

## 2017-05-31 ENCOUNTER — Telehealth: Payer: Self-pay | Admitting: *Deleted

## 2017-05-31 NOTE — Telephone Encounter (Signed)
Patient called and requested patches for motion sickness due to upcoming deep sea fishing trip.  Per Dr Melford Aase, a message was left to inform the patient that he does not RX the patches, due to possible side effect. He was advised to buy OTC Bonine for motion sickness.

## 2017-08-08 ENCOUNTER — Other Ambulatory Visit: Payer: Self-pay | Admitting: Adult Health

## 2017-08-08 DIAGNOSIS — M1A9XX0 Chronic gout, unspecified, without tophus (tophi): Secondary | ICD-10-CM

## 2017-08-09 ENCOUNTER — Other Ambulatory Visit: Payer: Self-pay | Admitting: Internal Medicine

## 2017-08-09 MED ORDER — ALPRAZOLAM 1 MG PO TABS: ORAL_TABLET | ORAL | 0 refills | Status: DC

## 2017-08-12 ENCOUNTER — Other Ambulatory Visit: Payer: Self-pay | Admitting: Internal Medicine

## 2017-08-12 MED ORDER — ALPRAZOLAM 1 MG PO TABS: ORAL_TABLET | ORAL | 0 refills | Status: DC

## 2017-09-09 ENCOUNTER — Ambulatory Visit: Payer: Self-pay | Admitting: Internal Medicine

## 2017-09-30 ENCOUNTER — Encounter: Payer: Self-pay | Admitting: Internal Medicine

## 2017-09-30 ENCOUNTER — Ambulatory Visit: Payer: 59 | Admitting: Internal Medicine

## 2017-09-30 ENCOUNTER — Other Ambulatory Visit: Payer: Self-pay | Admitting: *Deleted

## 2017-09-30 VITALS — BP 122/84 | HR 88 | Temp 97.3°F | Resp 16 | Ht 71.0 in | Wt 203.6 lb

## 2017-09-30 DIAGNOSIS — Z23 Encounter for immunization: Secondary | ICD-10-CM | POA: Diagnosis not present

## 2017-09-30 DIAGNOSIS — R7303 Prediabetes: Secondary | ICD-10-CM

## 2017-09-30 DIAGNOSIS — I10 Essential (primary) hypertension: Secondary | ICD-10-CM | POA: Diagnosis not present

## 2017-09-30 DIAGNOSIS — E559 Vitamin D deficiency, unspecified: Secondary | ICD-10-CM | POA: Diagnosis not present

## 2017-09-30 DIAGNOSIS — M109 Gout, unspecified: Secondary | ICD-10-CM

## 2017-09-30 DIAGNOSIS — Z79899 Other long term (current) drug therapy: Secondary | ICD-10-CM

## 2017-09-30 DIAGNOSIS — E782 Mixed hyperlipidemia: Secondary | ICD-10-CM | POA: Diagnosis not present

## 2017-09-30 DIAGNOSIS — G250 Essential tremor: Secondary | ICD-10-CM

## 2017-09-30 MED ORDER — ATORVASTATIN CALCIUM 80 MG PO TABS: ORAL_TABLET | ORAL | 0 refills | Status: DC

## 2017-09-30 MED ORDER — DIAZEPAM 5 MG PO TABS: ORAL_TABLET | ORAL | 2 refills | Status: DC

## 2017-09-30 MED ORDER — PROPRANOLOL HCL ER 120 MG PO CP24: ORAL_CAPSULE | ORAL | 1 refills | Status: DC

## 2017-09-30 NOTE — Patient Instructions (Signed)

## 2017-09-30 NOTE — Progress Notes (Signed)
This very nice 52 y.o. Separated WM presents for latefollow up with HTN, HLD, Pre-Diabetes and Vitamin D Deficiency. Patient has hx/o Gout quiescent on meds. Patient expresses concerns over a tremor which fluctuates in intensity sometimes interfering with writing.      Patient is treated for HTN (2006) & BP has been controlled at home. Today's BP is at goal - 122/84. Patient has had no complaints of any cardiac type chest pain, palpitations, dyspnea / orthopnea / PND, dizziness, claudication, or dependent edema.     Hyperlipidemia is controlled with diet & meds. Patient denies myalgias or other med SE's. Last Lipids were at goal: Lab Results  Component Value Date   CHOL 188 01/28/2017   HDL 74 01/28/2017   LDLCALC 91 01/28/2017   TRIG 125 01/28/2017   CHOLHDL 2.5 01/28/2017      Also, the patient is monitored for  PreDiabetes and has had no symptoms of reactive hypoglycemia, diabetic polys, paresthesias or visual blurring.  Last A1c was not at goal: Lab Results  Component Value Date   HGBA1C 5.9 (H) 01/28/2017      Further, the patient also has history of Vitamin D Deficiency "(25"/2008)  and supplements vitamin D without any suspected side-effects. Last vitamin D was Normal at goal: Lab Results  Component Value Date   VD25OH 82 09/10/2016   Current Outpatient Medications on File Prior to Visit  Medication Sig  . acyclovir (ZOVIRAX) 800 MG tablet TAKE 1 TABLET DAILY FOR FEVER BLISTERS  . allopurinol (ZYLOPRIM) 300 MG tablet TAKE 1 TABLET BY MOUTH EVERY DAY  . Cholecalciferol (VITAMIN D3) 400 units CAPS Take 5,000 Units by mouth daily.   . hydrochlorothiazide (HYDRODIURIL) 25 MG tablet TAKE 1 TABLET BY MOUTH EVERY DAY FOR BLOOD PRESSURE AND FLUID  . ibuprofen (ADVIL,MOTRIN) 200 MG tablet Take by mouth.  . loratadine-pseudoephedrine (CLARITIN-D 24 HOUR) 10-240 MG 24 hr tablet Take 1 tablet by mouth daily.  Marland Kitchen OVER THE COUNTER MEDICATION OTC pepcid for indigestion   No current  facility-administered medications on file prior to visit.    Allergies  Allergen Reactions  . Citalopram     dyphoria   PMHx:   Past Medical History:  Diagnosis Date  . Anxiety   . Encounter for long-term (current) use of other medications   . Hyperlipidemia   . Hypertension   . Hypogonadism male   . Vitamin D deficiency    Immunization History  Administered Date(s) Administered  . Influenza Inj Mdck Quad With Preservative 09/30/2017  . Influenza-Unspecified 10/07/2012  . PPD Test 01/12/2014, 08/01/2015, 09/10/2016  . Pneumococcal-Unspecified 02/11/2008  . Td 12/05/2005  . Tdap 08/01/2015   Past Surgical History:  Procedure Laterality Date  . HERNIA REPAIR    . I&D EXTREMITY Right 06/19/2012   Procedure: IRRIGATION AND DEBRIDEMENT FOREARM;  Surgeon: Tennis Must, MD;  Location: [facility];  Service: Orthopedics;  Laterality: Right;  . KNEE ARTHROSCOPY WITH ANTERIOR CRUCIATE LIGAMENT (ACL) REPAIR Left Jan 2017   Dr. Ballard Russell  . LASIK  2006  . VASECTOMY     FHx:    Reviewed / unchanged  SHx:    Reviewed / unchanged   Systems Review:  Constitutional: Denies fever, chills, wt changes, headaches, insomnia, fatigue, night sweats, change in appetite. Eyes: Denies redness, blurred vision, diplopia, discharge, itchy, watery eyes.  ENT: Denies discharge, congestion, post nasal drip, epistaxis, sore throat, earache, hearing loss, dental pain, tinnitus, vertigo, sinus pain, snoring.  CV: Denies chest  pain, palpitations, irregular heartbeat, syncope, dyspnea, diaphoresis, orthopnea, PND, claudication or edema. Respiratory: denies cough, dyspnea, DOE, pleurisy, hoarseness, laryngitis, wheezing.  Gastrointestinal: Denies dysphagia, odynophagia, heartburn, reflux, water brash, abdominal pain or cramps, nausea, vomiting, bloating, diarrhea, constipation, hematemesis, melena, hematochezia  or hemorrhoids. Genitourinary: Denies dysuria, frequency, urgency, nocturia, hesitancy, discharge,  hematuria or flank pain. Musculoskeletal: Denies arthralgias, myalgias, stiffness, jt. swelling, pain, limping or strain/sprain.  Skin: Denies pruritus, rash, hives, warts, acne, eczema or change in skin lesion(s). Neuro: No weakness, incoordination, spasms, paresthesia or pain. Psychiatric: Denies confusion, memory loss or sensory loss. Endo: Denies change in weight, skin or hair change.  Heme/Lymph: No excessive bleeding, bruising or enlarged lymph nodes.  Physical Exam  BP 122/84   Pulse 88   Temp (!) 97.3 F (36.3 C)   Resp 16   Ht 5\' 11"  (1.803 m)   Wt 203 lb 9.6 oz (92.4 kg)   BMI 28.40 kg/m   Appears  well nourished, well groomed  and in no distress.  Eyes: PERRLA, EOMs, conjunctiva no swelling or erythema. Sinuses: No frontal/maxillary tenderness ENT/Mouth: EAC's clear, TM's nl w/o erythema, bulging. Nares clear w/o erythema, swelling, exudates. Oropharynx clear without erythema or exudates. Oral hygiene is good. Tongue normal, non obstructing. Hearing intact.  Neck: Supple. Thyroid not palpable. Car 2+/2+ without bruits, nodes or JVD. Chest: Respirations nl with BS clear & equal w/o rales, rhonchi, wheezing or stridor.  Cor: Heart sounds normal w/ regular rate and rhythm without sig. murmurs, gallops, clicks or rubs. Peripheral pulses normal and equal  without edema.  Abdomen: Soft & bowel sounds normal. Non-tender w/o guarding, rebound, hernias, masses or organomegaly.  Lymphatics: Unremarkable.  Musculoskeletal: Full ROM all peripheral extremities, joint stability, 5/5 strength and normal gait.  Skin: Warm, dry without exposed rashes, lesions or ecchymosis apparent.  Neuro: Cranial nerves intact, reflexes equal bilaterally. Sensory-motor testing grossly intact. Tendon reflexes grossly intact. Has high frequency low amplitude tremor of hands. Pysch: Alert & oriented x 3.  Insight and judgement nl & appropriate. No ideations.  Assessment and Plan:  1. Essential  hypertension  - D/C  Enalapril - New Rx Inderal 120 mg LA #90 x 2 Rf - take 1 cap qd -  monitor blood pressure at home.  - Continue DASH diet.  Reminder to go to the ER if any CP,  SOB, nausea, dizziness, severe HA, changes vision/speech.  - CBC with Differential/Platelet - COMPLETE METABOLIC PANEL WITH GFR - Magnesium - TSH  2. Hyperlipidemia, mixed  - Continue diet/meds, exercise,& lifestyle modifications.  - Continue monitor periodic cholesterol/liver & renal functions   - Lipid panel - TSH  3. Prediabetes  - Hemoglobin A1c - Insulin, random  4. Vitamin D deficiency  - Continue diet, exercise, lifestyle modifications.  - Monitor appropriate labs. - Continue supplementation.  - VITAMIN D 25 Hydroxyl  5. Gout  - Uric acid  6.  Hereditary Tremor   - Rx Inderal 120 mg LA - Rx Diazepam 5 mg #90 x 2 RF - take 1/2 to 1 tab 3 x /day for tremor  7.  Medication management  - CBC with Differential/Platelet - COMPLETE METABOLIC PANEL WITH GFR - Magnesium - Lipid panel - TSH - Hemoglobin A1c - Insulin, random - VITAMIN D 25 Hydroxyl - Uric acid       Discussed  regular exercise, BP monitoring, weight control to achieve/maintain BMI less than 25 and discussed med and SE's. Recommended labs to assess and monitor clinical status with  further disposition pending results of labs. Over 30 minutes of exam, counseling, chart review was performed. Recc f/u OV in 2-3 weeks to reassess tremor

## 2017-10-01 ENCOUNTER — Other Ambulatory Visit: Payer: Self-pay | Admitting: *Deleted

## 2017-10-01 LAB — LIPID PANEL
CHOL/HDL RATIO: 2.8 (calc) (ref <5.0)
Cholesterol: 202 mg/dL — ABNORMAL HIGH (ref <200)
HDL: 72 mg/dL (ref >40)
LDL CHOLESTEROL (CALC): 107 mg/dL — AB
Non-HDL Cholesterol (Calc): 130 mg/dL (calc) — ABNORMAL HIGH (ref <130)
TRIGLYCERIDES: 115 mg/dL (ref <150)

## 2017-10-01 LAB — HEMOGLOBIN A1C
EAG (MMOL/L): 6.3 (calc)
HEMOGLOBIN A1C: 5.6 %{Hb} (ref <5.7)
MEAN PLASMA GLUCOSE: 114 (calc)

## 2017-10-01 LAB — CBC WITH DIFFERENTIAL/PLATELET
BASOS PCT: 0.3 %
Basophils Absolute: 20 cells/uL (ref 0–200)
EOS PCT: 2.1 %
Eosinophils Absolute: 141 cells/uL (ref 15–500)
HEMATOCRIT: 42.1 % (ref 38.5–50.0)
HEMOGLOBIN: 14.8 g/dL (ref 13.2–17.1)
LYMPHS ABS: 951 {cells}/uL (ref 850–3900)
MCH: 33.6 pg — ABNORMAL HIGH (ref 27.0–33.0)
MCHC: 35.2 g/dL (ref 32.0–36.0)
MCV: 95.7 fL (ref 80.0–100.0)
MONOS PCT: 9.1 %
MPV: 10 fL (ref 7.5–12.5)
Neutro Abs: 4978 cells/uL (ref 1500–7800)
Neutrophils Relative %: 74.3 %
Platelets: 212 10*3/uL (ref 140–400)
RBC: 4.4 10*6/uL (ref 4.20–5.80)
RDW: 12.8 % (ref 11.0–15.0)
Total Lymphocyte: 14.2 %
WBC mixed population: 610 cells/uL (ref 200–950)
WBC: 6.7 10*3/uL (ref 3.8–10.8)

## 2017-10-01 LAB — URIC ACID: Uric Acid, Serum: 5.5 mg/dL (ref 4.0–8.0)

## 2017-10-01 LAB — TSH: TSH: 1.43 m[IU]/L (ref 0.40–4.50)

## 2017-10-01 LAB — COMPLETE METABOLIC PANEL WITH GFR
AG Ratio: 1.4 (calc) (ref 1.0–2.5)
ALBUMIN MSPROF: 3.8 g/dL (ref 3.6–5.1)
ALKALINE PHOSPHATASE (APISO): 50 U/L (ref 40–115)
ALT: 68 U/L — ABNORMAL HIGH (ref 9–46)
AST: 56 U/L — ABNORMAL HIGH (ref 10–40)
BUN: 18 mg/dL (ref 7–25)
CALCIUM: 9.2 mg/dL (ref 8.6–10.3)
CO2: 33 mmol/L — AB (ref 20–32)
CREATININE: 1.11 mg/dL (ref 0.60–1.35)
Chloride: 96 mmol/L — ABNORMAL LOW (ref 98–110)
GFR, EST NON AFRICAN AMERICAN: 78 mL/min/{1.73_m2} (ref >=60)
GFR, Est African American: 90 mL/min/{1.73_m2} (ref >=60)
GLUCOSE: 114 mg/dL — AB (ref 65–99)
Globulin: 2.7 g/dL (calc) (ref 1.9–3.7)
Potassium: 3.8 mmol/L (ref 3.5–5.3)
Sodium: 138 mmol/L (ref 135–146)
TOTAL PROTEIN: 6.5 g/dL (ref 6.1–8.1)
Total Bilirubin: 1.2 mg/dL (ref 0.2–1.2)

## 2017-10-01 LAB — MAGNESIUM: MAGNESIUM: 1.5 mg/dL (ref 1.5–2.5)

## 2017-10-01 LAB — VITAMIN D 25 HYDROXY (VIT D DEFICIENCY, FRACTURES): Vit D, 25-Hydroxy: 94 ng/mL (ref 30–100)

## 2017-10-01 LAB — INSULIN, RANDOM: INSULIN: 16.8 u[IU]/mL (ref 2.0–19.6)

## 2017-10-01 MED ORDER — PREDNISONE 20 MG PO TABS: ORAL_TABLET | ORAL | 0 refills | Status: DC

## 2017-10-14 ENCOUNTER — Ambulatory Visit: Payer: Self-pay | Admitting: Internal Medicine

## 2017-10-14 ENCOUNTER — Encounter: Payer: Self-pay | Admitting: Internal Medicine

## 2017-11-05 ENCOUNTER — Other Ambulatory Visit: Payer: Self-pay | Admitting: Internal Medicine

## 2017-11-05 DIAGNOSIS — M1A9XX0 Chronic gout, unspecified, without tophus (tophi): Secondary | ICD-10-CM

## 2017-11-10 NOTE — Patient Instructions (Signed)
Essential Tremor  A tremor is trembling or shaking that you cannot control. Most tremors affect the hands or arms. Tremors can also affect the head, vocal cords, face, and other parts of the body. Essential tremor is a tremor without a known cause.  What are the causes?  Essential tremor has no known cause.  What increases the risk?  You may be at greater risk of essential tremor if:  You have a family member with essential tremor.  You are age 50 or older.  You take certain medicines.  What are the signs or symptoms?  The main sign of a tremor is uncontrolled and unintentional rhythmic shaking of a body part.  You may have difficulty eating with a spoon or fork.  You may have difficulty writing.  You may nod your head up and down or side to side.  You may have a quivering voice.  Your tremors:   May get worse over time.  May come and go.  May be more noticeable on one side of your body.  May get worse due to stress, fatigue, caffeine, nicotine and extreme heat or cold.  How is this diagnosed?  Your health care provider can diagnose essential tremor based on your symptoms, medical history, and a physical examination. There is no single test to diagnose an essential tremor. However, your health care provider may perform a variety of tests to rule out other conditions. Tests may include:  Blood and urine tests.  A test that measures involuntary muscle movement (electromyogram).  How is this treated?  Your tremors may go away without treatment. Mild tremors may not need treatment if they do not affect your day-to-day life. Severe tremors may need to be treated using one or a combination of the following options:  Medicines. This may include medicine that is injected.  Lifestyle changes.  Physical therapy.  Follow these instructions at home:   Take medicines only as directed by your health care provider.  Limit alcohol intake to no more than 1 drink per  day for nonpregnant women and 2 drinks per day for men. One drink equals 12 oz of beer, 5 oz of wine, or 1 oz of hard liquor.  Do not use any tobacco products, including cigarettes, chewing tobacco, or electronic cigarettes. If you need help quitting, ask your health care provider.  Take medicines only as directed by your health care provider.  Avoid extreme heat or cold.  Limit the amount of caffeine you consumeas directed by your health care provider.  Try to get eight hours of sleep each night.  Find ways to manage your stress, such as meditation or yoga.  Keep all follow-up visits as directed by your health care provider. This is important. This includes any physical therapy visits.  Avoid Decongestants  (Sudafed type meds)  Contact a health care provider if:   You experience any changes in the location or intensity of your tremors.  You start having a tremor after starting a new medicine.  You have tremor with other symptoms such as: ? Numbness. ? Tingling. ? Pain. ? Weakness. ?   Your tremor gets worse.  Your tremor interferes with your daily life.

## 2017-11-10 NOTE — Progress Notes (Signed)
  Subjective:    Patient ID: Joshua Kerr, male    DOB: 1965/10/02, 52 y.o.   MRN: [id number]  HPI    Patient is a nice 52 yo DWM w/HTN, HLD, Vit D Def and hx/o Gout  who was recently switched from Enalapril  to Inderal & added Diazepam for Hereditary Tremor. At last OV magnesium was low at 1.5. Says tremor much better on Propranolol and 1/2 to 1 tab Diazepam qam.  Medication Sig  . acyclovir (ZOVIRAX) 800 MG tablet TAKE 1 TABLET DAILY FOR FEVER BLISTERS  . allopurinol (ZYLOPRIM) 300 MG tablet TAKE 1 TABLET BY MOUTH EVERY DAY  . atorvastatin (LIPITOR) 80 MG tablet TAKE 1 TABLET BY MOUTH EVERY DAY AS DIRECTED FOR CHOLESTEROL  . Cholecalciferol (VITAMIN D3) 400 units CAPS Take 5,000 Units by mouth daily.   . diazepam (VALIUM) 5 MG tablet Take 1/2 to 1 tablet 3 x /day for tremor  . hydrochlorothiazide (HYDRODIURIL) 25 MG tablet TAKE 1 TABLET BY MOUTH EVERY DAY FOR BLOOD PRESSURE AND FLUID  . ibuprofen (ADVIL,MOTRIN) 200 MG tablet Take by mouth.  . loratadine-pseudoephedrine (CLARITIN-D 24 HOUR) 10-240 MG 24 hr tablet Take 1 tablet by mouth daily.  Marland Kitchen OVER THE COUNTER MEDICATION OTC pepcid for indigestion  . propranolol ER (INDERAL LA) 120 MG 24 hr capsule Take 1 capsule daily for BP & Tremor  . predniSONE (DELTASONE) 20 MG tablet Take 1 tablet tid x 3 days, 1 tablet BID x 2 days and 1 daily x 5 days.   Allergies  Allergen Reactions  . Citalopram     dyphoria    Past Medical History:  Diagnosis Date  . Anxiety   . Encounter for long-term (current) use of other medications   . Hyperlipidemia   . Hypertension   . Hypogonadism male   . Vitamin D deficiency    Review of Systems     10 point systems review negative except as above.    Objective:   Physical Exam  BP 120/86   Pulse 88   Temp 97.8 F (36.6 C)   Resp 14   Ht 5\' 11"  (1.803 m)   Wt 213 lb (96.6 kg)   SpO2 98%   BMI 29.71 kg/m   HEENT - WNL. Neck - supple.  Chest - Clear equal BS. Cor - Nl HS. RRR w/o sig MGR. PP  1(+). No edema. MS- FROM w/o deformities.  Gait Nl. Neuro -  Nl w/o focal abnormalities.    Assessment & Plan:   1. Essential hypertension  2. Hereditary essential tremor  - Rx Depade 50 Mg # 120 x 1 Rf  --Begin 1 qd x 2 wks, them bid x 2 wk, then tid x 2 wks, then qid        .

## 2017-11-11 ENCOUNTER — Encounter: Payer: Self-pay | Admitting: Internal Medicine

## 2017-11-11 ENCOUNTER — Ambulatory Visit: Payer: 59 | Admitting: Internal Medicine

## 2017-11-11 VITALS — BP 120/86 | HR 88 | Temp 97.8°F | Resp 14 | Ht 71.0 in | Wt 213.0 lb

## 2017-11-11 DIAGNOSIS — I10 Essential (primary) hypertension: Secondary | ICD-10-CM

## 2017-11-11 DIAGNOSIS — G250 Essential tremor: Secondary | ICD-10-CM | POA: Diagnosis not present

## 2017-11-11 MED ORDER — NALTREXONE HCL 50 MG PO TABS: ORAL_TABLET | ORAL | 2 refills | Status: DC

## 2017-11-29 ENCOUNTER — Other Ambulatory Visit: Payer: Self-pay | Admitting: Internal Medicine

## 2017-12-27 ENCOUNTER — Other Ambulatory Visit: Payer: Self-pay | Admitting: Internal Medicine

## 2018-02-11 ENCOUNTER — Other Ambulatory Visit: Payer: Self-pay | Admitting: Internal Medicine

## 2018-02-11 DIAGNOSIS — G250 Essential tremor: Secondary | ICD-10-CM

## 2018-02-13 ENCOUNTER — Other Ambulatory Visit: Payer: Self-pay | Admitting: Internal Medicine

## 2018-02-13 DIAGNOSIS — K120 Recurrent oral aphthae: Secondary | ICD-10-CM

## 2018-02-13 MED ORDER — AZITHROMYCIN 250 MG PO TABS: ORAL_TABLET | ORAL | 1 refills | Status: DC

## 2018-02-13 MED ORDER — ACYCLOVIR 800 MG PO TABS: ORAL_TABLET | ORAL | 3 refills | Status: DC

## 2018-03-03 ENCOUNTER — Encounter: Payer: Self-pay | Admitting: Internal Medicine

## 2018-03-03 ENCOUNTER — Ambulatory Visit (INDEPENDENT_AMBULATORY_CARE_PROVIDER_SITE_OTHER): Payer: 59 | Admitting: Internal Medicine

## 2018-03-03 VITALS — BP 132/82 | HR 80 | Temp 97.5°F | Resp 18 | Ht 71.0 in | Wt 213.0 lb

## 2018-03-03 DIAGNOSIS — Z136 Encounter for screening for cardiovascular disorders: Secondary | ICD-10-CM

## 2018-03-03 DIAGNOSIS — Z111 Encounter for screening for respiratory tuberculosis: Secondary | ICD-10-CM

## 2018-03-03 DIAGNOSIS — Z1212 Encounter for screening for malignant neoplasm of rectum: Secondary | ICD-10-CM

## 2018-03-03 DIAGNOSIS — Z0001 Encounter for general adult medical examination with abnormal findings: Secondary | ICD-10-CM

## 2018-03-03 DIAGNOSIS — M109 Gout, unspecified: Secondary | ICD-10-CM

## 2018-03-03 DIAGNOSIS — R7309 Other abnormal glucose: Secondary | ICD-10-CM

## 2018-03-03 DIAGNOSIS — Z8249 Family history of ischemic heart disease and other diseases of the circulatory system: Secondary | ICD-10-CM

## 2018-03-03 DIAGNOSIS — I10 Essential (primary) hypertension: Secondary | ICD-10-CM

## 2018-03-03 DIAGNOSIS — R7303 Prediabetes: Secondary | ICD-10-CM

## 2018-03-03 DIAGNOSIS — Z125 Encounter for screening for malignant neoplasm of prostate: Secondary | ICD-10-CM

## 2018-03-03 DIAGNOSIS — Z1211 Encounter for screening for malignant neoplasm of colon: Secondary | ICD-10-CM

## 2018-03-03 DIAGNOSIS — Z0000 Encounter for general adult medical examination without abnormal findings: Secondary | ICD-10-CM

## 2018-03-03 DIAGNOSIS — Z79899 Other long term (current) drug therapy: Secondary | ICD-10-CM

## 2018-03-03 DIAGNOSIS — R5383 Other fatigue: Secondary | ICD-10-CM

## 2018-03-03 DIAGNOSIS — E782 Mixed hyperlipidemia: Secondary | ICD-10-CM

## 2018-03-03 NOTE — Patient Instructions (Signed)

## 2018-03-03 NOTE — Progress Notes (Signed)
[facility]   Unk Pinto, M.D.     Uvaldo Bristle. Silverio Lay, P.A.-C Liane Comber, [place]                [address] [place] [address] Telephone [phone] Telefax [phone] Annual  Screening/Preventative Visit  & Comprehensive Evaluation & Examination     This very nice 53 y.o. DWM presents for a Screening /Preventative Visit & comprehensive evaluation and management of multiple medical co-morbidities.  Patient has been followed for HTN, HLD, Prediabetes and Vitamin D Deficiency.     Patient relates (+) FHx /o Gastric cancer in mother & maternal Gr Mother and has concerns wrt sensation of early satiety and epigastric discomfort     HTN predates since 2006. Patient's BP has been controlled and today's BP was initially sl elevated & rechecked at goal - 132/82. Patient denies any cardiac symptoms as chest pain, palpitations, shortness of breath, dizziness or ankle swelling.     Patient's hyperlipidemia is controlled with diet and medications. Patient denies myalgias or other medication SE's. Last lipids were near goal with a favorable elevated HDL 72: Lab Results  Component Value Date   CHOL 202 (H) 09/30/2017   HDL 72 09/30/2017   LDLCALC 107 (H) 09/30/2017   TRIG 115 09/30/2017   CHOLHDL 2.8 09/30/2017      Patient is monitored expectantly for glucose intolerance and patient denies reactive hypoglycemic symptoms, visual blurring, diabetic polys or paresthesias. Last A1c was Normal & at goal: Lab Results  Component Value Date   HGBA1C 5.6 09/30/2017       Finally, patient has history of Vitamin D Deficiency ("25" / 2008) and last vitamin D was at goal: Lab Results  Component Value Date   VD25OH 94 09/30/2017   Current Outpatient Medications on File Prior to Visit  Medication Sig  . acyclovir (ZOVIRAX) 800 MG tablet TAKE 1 TABLET DAILY FOR FEVER BLISTERS  . allopurinol  (ZYLOPRIM) 300 MG tablet TAKE 1 TABLET BY MOUTH EVERY DAY  . ALPRAZolam (XANAX) 1 MG tablet Take 1/2-1 tablet 2 - 3 x /day ONLY if needed for Anxiety Attack &  limit to 5 days /week to avoid addiction  . atorvastatin (LIPITOR) 80 MG tablet TAKE 1 TABLET BY MOUTH EVERY DAY AS DIRECTED FOR CHOLESTEROL  . Cholecalciferol (VITAMIN D3) 400 units CAPS Take 5,000 Units by mouth daily.   . hydrochlorothiazide (HYDRODIURIL) 25 MG tablet TAKE 1 TABLET BY MOUTH EVERY DAY FOR BLOOD PRESSURE AND FLUID  . ibuprofen (ADVIL,MOTRIN) 200 MG tablet Take by mouth.  . loratadine-pseudoephedrine (CLARITIN-D 24 HOUR) 10-240 MG 24 hr tablet Take 1 tablet by mouth daily.  . propranolol ER (INDERAL LA) 120 MG 24 hr capsule TAKE 1 CAPSULE DAILY FOR BP & TREMOR   Allergies  Allergen Reactions  . Citalopram     dyphoria   Past Medical History:  Diagnosis Date  . Anxiety   . Encounter for long-term (current) use of other medications   . Hyperlipidemia   . Hypertension   . Hypogonadism male   . Vitamin D deficiency    Health Maintenance  Topic Date Due  . COLONOSCOPY  11/01/2017  . TETANUS/TDAP  07/31/2025  . INFLUENZA VACCINE  Completed  . HIV Screening  Completed   Immunization History  Administered Date(s) Administered  . Influenza Inj Mdck Quad With Preservative 09/30/2017  .  Influenza-Unspecified 10/07/2012  . PPD Test 01/12/2014, 08/01/2015, 09/10/2016  . Pneumococcal-Unspecified 02/11/2008  . Td 12/05/2005  . Tdap 08/01/2015   Past Surgical History:  Procedure Laterality Date  . HERNIA REPAIR    . I&D EXTREMITY Right 06/19/2012   Procedure: IRRIGATION AND DEBRIDEMENT FOREARM;  Surgeon: Tennis Must, MD;  Location: [facility];  Service: Orthopedics;  Laterality: Right;  . KNEE ARTHROSCOPY WITH ANTERIOR CRUCIATE LIGAMENT (ACL) REPAIR Left Jan 2017   Dr. Ballard Russell  . LASIK  2006  . VASECTOMY     Family History  Problem Relation Age of Onset  . Cancer Mother        Stomach, lymphoma, melanoma  .  Diabetes Mother   . Heart disease Mother    Social History   Socioeconomic History  . Marital status: Separated & still not divorced  Occupational History  . 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 Smoking status: Never Smoker  . Smokeless tobacco: Never Used  Substance and Sexual Activity  . Alcohol use: No  . Drug use: No  . Sexual activity: Yes    ROS Constitutional: Denies fever, chills, weight loss/gain, headaches, insomnia,  night sweats or change in appetite. Does c/o fatigue. Eyes: Denies redness, blurred vision, diplopia, discharge, itchy or watery eyes.  ENT: Denies discharge, congestion, post nasal drip, epistaxis, sore throat, earache, hearing loss, dental pain, Tinnitus, Vertigo, Sinus pain or snoring.  Cardio: Denies chest pain, palpitations, irregular heartbeat, syncope, dyspnea, diaphoresis, orthopnea, PND, claudication or edema Respiratory: denies cough, dyspnea, DOE, pleurisy, hoarseness, laryngitis or wheezing.  Gastrointestinal: Denies dysphagia, heartburn, reflux, water brash, pain, cramps, nausea, vomiting, bloating, diarrhea, constipation, hematemesis, melena, hematochezia, jaundice or hemorrhoids Genitourinary: Denies dysuria, frequency, urgency, nocturia, hesitancy, discharge, hematuria or flank pain Musculoskeletal: Denies arthralgia, myalgia, stiffness, Jt. Swelling, pain, limp or strain/sprain. Denies Falls. Skin: Denies puritis, rash, hives, warts, acne, eczema or change in skin lesion Neuro: No weakness, tremor, incoordination, spasms, paresthesia or pain Psychiatric: Denies confusion, memory loss or sensory loss. Denies Depression. Endocrine: Denies change in weight, skin, hair change, nocturia, and paresthesia, diabetic polys, visual blurring or hyper / hypo glycemic episodes.  Heme/Lymph: No excessive bleeding, bruising or enlarged lymph nodes.  Physical Exam  BP 132/82   Pulse 80   Temp (!) 97.5 F (36.4 C)   Resp 18   Ht 5\' 11"   (1.803 m)   Wt 213 lb (96.6 kg)   BMI 29.71 kg/m   General Appearance: Well nourished and well groomed and in no apparent distress.  Eyes: PERRLA, EOMs, conjunctiva no swelling or erythema, normal fundi and vessels. Sinuses: No frontal/maxillary tenderness ENT/Mouth: EACs patent / TMs  nl. Nares clear without erythema, swelling, mucoid exudates. Oral hygiene is good. No erythema, swelling, or exudate. Tongue normal, non-obstructing. Tonsils not swollen or erythematous. Hearing normal.  Neck: Supple, thyroid not palpable. No bruits, nodes or JVD. Respiratory: Respiratory effort normal.  BS equal and clear bilateral without rales, rhonci, wheezing or stridor. Cardio: Heart sounds are normal with regular rate and rhythm and no murmurs, rubs or gallops. Peripheral pulses are normal and equal bilaterally without edema. No aortic or femoral bruits. Chest: symmetric with normal excursions and percussion.  Abdomen: Soft, with Nl bowel sounds. Nontender, no guarding, rebound, hernias, masses, or organomegaly.  Lymphatics: Non tender without lymphadenopathy.  Musculoskeletal: Full ROM all peripheral extremities, joint stability, 5/5 strength, and normal gait. Skin: Warm and dry without rashes, lesions, cyanosis, clubbing or  ecchymosis.  Neuro: Cranial nerves intact, reflexes equal bilaterally. Normal muscle tone, no cerebellar symptoms. Sensation intact.  Pysch: Alert and oriented X 3 with normal affect, insight and judgment appropriate.   Assessment and Plan  1. Annual Preventative/Screening Exam   2. Essential hypertension  - EKG 12-Lead - [place], RETROPERITNL ABD,  LTD - Urinalysis, Routine w reflex microscopic - Microalbumin / creatinine urine ratio - CBC with Differential/Platelet - COMPLETE METABOLIC PANEL WITH GFR - Magnesium - TSH  3. Hyperlipidemia, mixed  - EKG 12-Lead - [place], RETROPERITNL ABD,  LTD - Lipid panel - TSH  4. Abnormal glucose  - EKG 12-Lead - [place], RETROPERITNL  ABD,  LTD - Hemoglobin A1c - Insulin, random  5. Gout  - Uric acid - VITAMIN D 25 Hydroxyl  6. Prediabetes  - EKG 12-Lead - [place], RETROPERITNL ABD,  LTD - Hemoglobin A1c - Insulin, random  7. Screening for colorectal cancer  - POC Hemoccult Bld/Stl  8. Prostate cancer screening  - PSA  9. Screening for ischemic heart disease  - EKG 12-Lead  10. FHx: heart disease  - EKG 12-Lead - [place], RETROPERITNL ABD,  LTD  11. Screening for AAA (aortic abdominal aneurysm)  - [place], RETROPERITNL ABD,  LTD  12. Screening examination for pulmonary tuberculosis  - TB Skin Test  13. Fatigue  - Iron,Total/Total Iron Binding Cap - Vitamin B12 - Testosterone - CBC with Differential/Platelet - TSH  14. Medication management  - Urinalysis, Routine w reflex microscopic - Microalbumin / creatinine urine ratio - CBC with Differential/Platelet - COMPLETE METABOLIC PANEL WITH GFR - Magnesium - Lipid panel - TSH - Hemoglobin A1c - Insulin, random - VITAMIN D 25 Hydroxyl        Patient was counseled in prudent diet, weight control to achieve/maintain BMI less than 25, BP monitoring, regular exercise and medications as discussed.  Discussed med effects and SE's. Routine screening labs and tests as requested with regular follow-up as recommended. Over 40 minutes of exam, counseling, chart review and high complex critical decision making was performed

## 2018-03-04 LAB — CBC WITH DIFFERENTIAL/PLATELET
ABSOLUTE MONOCYTES: 550 {cells}/uL (ref 200–950)
Basophils Absolute: 28 cells/uL (ref 0–200)
Basophils Relative: 0.5 %
Eosinophils Absolute: 132 cells/uL (ref 15–500)
Eosinophils Relative: 2.4 %
HCT: 39.6 % (ref 38.5–50.0)
Hemoglobin: 14.3 g/dL (ref 13.2–17.1)
LYMPHS ABS: 1194 {cells}/uL (ref 850–3900)
MCH: 35.9 pg — ABNORMAL HIGH (ref 27.0–33.0)
MCHC: 36.1 g/dL — ABNORMAL HIGH (ref 32.0–36.0)
MCV: 99.5 fL (ref 80.0–100.0)
MPV: 9.7 fL (ref 7.5–12.5)
Monocytes Relative: 10 %
Neutro Abs: 3597 cells/uL (ref 1500–7800)
Neutrophils Relative %: 65.4 %
Platelets: 242 10*3/uL (ref 140–400)
RBC: 3.98 10*6/uL — ABNORMAL LOW (ref 4.20–5.80)
RDW: 14 % (ref 11.0–15.0)
Total Lymphocyte: 21.7 %
WBC: 5.5 10*3/uL (ref 3.8–10.8)

## 2018-03-04 LAB — IRON, TOTAL/TOTAL IRON BINDING CAP
%SAT: 37 % (calc) (ref 20–48)
Iron: 112 ug/dL (ref 50–180)
TIBC: 300 mcg/dL (calc) (ref 250–425)

## 2018-03-04 LAB — URINALYSIS, ROUTINE W REFLEX MICROSCOPIC
BACTERIA UA: NONE SEEN /HPF
Bilirubin Urine: NEGATIVE
Glucose, UA: NEGATIVE
Hyaline Cast: NONE SEEN /LPF
Ketones, ur: NEGATIVE
Leukocytes,Ua: NEGATIVE
Nitrite: NEGATIVE
Specific Gravity, Urine: 1.009 (ref 1.001–1.03)
Squamous Epithelial / HPF: NONE SEEN /HPF (ref <=5)
WBC, UA: NONE SEEN /HPF (ref 0–5)
pH: 7.5 (ref 5.0–8.0)

## 2018-03-04 LAB — MICROALBUMIN / CREATININE URINE RATIO
Creatinine, Urine: 70 mg/dL (ref 20–320)
Microalb Creat Ratio: 916 mcg/mg creat — ABNORMAL HIGH (ref <30)
Microalb, Ur: 64.1 mg/dL

## 2018-03-04 LAB — COMPLETE METABOLIC PANEL WITH GFR
AG Ratio: 1.3 (calc) (ref 1.0–2.5)
ALT: 104 U/L — ABNORMAL HIGH (ref 9–46)
AST: 155 U/L — ABNORMAL HIGH (ref 10–35)
Albumin: 4 g/dL (ref 3.6–5.1)
Alkaline phosphatase (APISO): 62 U/L (ref 35–144)
BUN: 11 mg/dL (ref 7–25)
CALCIUM: 9.8 mg/dL (ref 8.6–10.3)
CO2: 33 mmol/L — ABNORMAL HIGH (ref 20–32)
Chloride: 94 mmol/L — ABNORMAL LOW (ref 98–110)
Creat: 1.11 mg/dL (ref 0.70–1.33)
GFR, Est African American: 89 mL/min/{1.73_m2} (ref >=60)
GFR, Est Non African American: 77 mL/min/{1.73_m2} (ref >=60)
Globulin: 3.1 g/dL (calc) (ref 1.9–3.7)
Glucose, Bld: 97 mg/dL (ref 65–99)
Potassium: 3.5 mmol/L (ref 3.5–5.3)
Sodium: 139 mmol/L (ref 135–146)
Total Bilirubin: 1.1 mg/dL (ref 0.2–1.2)
Total Protein: 7.1 g/dL (ref 6.1–8.1)

## 2018-03-04 LAB — URIC ACID: Uric Acid, Serum: 10 mg/dL — ABNORMAL HIGH (ref 4.0–8.0)

## 2018-03-04 LAB — VITAMIN D 25 HYDROXY (VIT D DEFICIENCY, FRACTURES): Vit D, 25-Hydroxy: 68 ng/mL (ref 30–100)

## 2018-03-04 LAB — INSULIN, RANDOM: Insulin: 5.1 u[IU]/mL

## 2018-03-04 LAB — PSA: PSA: 2.9 ng/mL (ref <=4.0)

## 2018-03-04 LAB — LIPID PANEL
CHOL/HDL RATIO: 3.2 (calc) (ref <5.0)
Cholesterol: 188 mg/dL (ref <200)
HDL: 59 mg/dL (ref >=40)
LDL Cholesterol (Calc): 102 mg/dL (calc) — ABNORMAL HIGH
Non-HDL Cholesterol (Calc): 129 mg/dL (calc) (ref <130)
Triglycerides: 176 mg/dL — ABNORMAL HIGH (ref <150)

## 2018-03-04 LAB — HEMOGLOBIN A1C
Hgb A1c MFr Bld: 5.6 % of total Hgb (ref <5.7)
Mean Plasma Glucose: 114 (calc)
eAG (mmol/L): 6.3 (calc)

## 2018-03-04 LAB — VITAMIN B12: Vitamin B-12: 478 pg/mL (ref 200–1100)

## 2018-03-04 LAB — TESTOSTERONE: Testosterone: 306 ng/dL (ref 250–827)

## 2018-03-04 LAB — TSH: TSH: 2.49 mIU/L (ref 0.40–4.50)

## 2018-03-04 LAB — MAGNESIUM: Magnesium: 1.6 mg/dL (ref 1.5–2.5)

## 2018-03-27 MED ORDER — SODIUM CHLORIDE 0.9 % IV SOLN
10.00 | INTRAVENOUS | Status: DC
Start: ? — End: 2018-03-27

## 2018-03-27 MED ORDER — GENERIC EXTERNAL MEDICATION
Status: DC
Start: ? — End: 2018-03-27

## 2018-04-09 ENCOUNTER — Other Ambulatory Visit: Payer: Self-pay | Admitting: Internal Medicine

## 2018-04-10 ENCOUNTER — Other Ambulatory Visit: Payer: Self-pay | Admitting: Internal Medicine

## 2018-04-10 DIAGNOSIS — K120 Recurrent oral aphthae: Secondary | ICD-10-CM

## 2018-05-13 ENCOUNTER — Other Ambulatory Visit: Payer: Self-pay | Admitting: Internal Medicine

## 2018-05-13 DIAGNOSIS — M109 Gout, unspecified: Secondary | ICD-10-CM

## 2018-05-13 MED ORDER — PREDNISONE 20 MG PO TABS: ORAL_TABLET | ORAL | 1 refills | Status: DC

## 2018-06-12 ENCOUNTER — Ambulatory Visit (INDEPENDENT_AMBULATORY_CARE_PROVIDER_SITE_OTHER): Payer: 59 | Admitting: Internal Medicine

## 2018-06-12 ENCOUNTER — Other Ambulatory Visit: Payer: Self-pay

## 2018-06-12 ENCOUNTER — Encounter: Payer: Self-pay | Admitting: Internal Medicine

## 2018-06-12 VITALS — Ht 71.0 in | Wt 204.0 lb

## 2018-06-12 DIAGNOSIS — Z800 Family history of malignant neoplasm of digestive organs: Secondary | ICD-10-CM

## 2018-06-12 DIAGNOSIS — Z1211 Encounter for screening for malignant neoplasm of colon: Secondary | ICD-10-CM

## 2018-06-12 DIAGNOSIS — R6881 Early satiety: Secondary | ICD-10-CM | POA: Diagnosis not present

## 2018-06-12 NOTE — Patient Instructions (Signed)
It was nice to meet you today.  Our plans are for you to have an upper GI endoscopy to evaluate the early satiety symptoms (feeling full faster than normal), and the family history of gastric cancer.  A screening colonoscopy will be done after the upper GI endoscopy on the same day.   We should be able to get this done before the end of June without difficulty.  I appreciate the opportunity to care for you. Gatha Mayer, MD, [facility]

## 2018-06-12 NOTE — Progress Notes (Signed)
TELEHEALTH ENCOUNTER IN SETTING OF COVID-19 PANDEMIC - REQUESTED BY PATIENT SERVICE PROVIDED BY TELEMEDECINE - TYPE: Zoom AV PATIENT LOCATION: Home PATIENT HAS CONSENTED TO TELEHEALTH VISIT PROVIDER LOCATION: OFFICE REFERRING PROVIDER:McKeown, William, MD PARTICIPANTS OTHER THAN PATIENT:none TIME SPENT ON CALL:9 mins    Joshua Kerr 53 y.o. 15-Aug-1965 [id number]  Assessment & Plan:   Encounter Diagnoses  Name Primary?   Colon cancer screening    Early satiety Yes   Family history of gastric cancer in mother and maternal grandmother    Schedule EGD and colonoscopy - evaluate early satiety and screen for colon cancer  The risks and benefits as well as alternatives of endoscopic procedure(s) have been discussed and reviewed. All questions answered. The patient agrees to proceed.  I appreciate the opportunity to care for him.  Cc;McKeown, Gwyndolyn Saxon, MD   Subjective:   Chief Complaint: early satiety, family history stomach cancer, colon cancerscreening  HPI The patient is a 53 year old white man who presents to discuss colonoscopy for colon cancer screening but was also referred because he has early satiety and a family history of gastric cancer.  For about a year he has noticed early satiety without dysphagia abdominal pain or unintentional weight loss.  No significant heartburn or other GI symptoms.  Bowel habits are regular.  His mother and maternal grandmother both died from gastric cancer.  His GI review of systems otherwise negative.   Wt Readings from Last 3 Encounters:  06/12/18 204 lb (92.5 kg)  06/10/18 204 lb (92.5 kg)  03/03/18 213 lb (96.6 kg)    Allergies  Allergen Reactions   Citalopram     dyphoria   Current Meds  Medication Sig   acyclovir (ZOVIRAX) 800 MG tablet TAKE 1 TABLET DAILY FOR FEVER BLISTERS (Patient taking differently: TAKE 1 TABLET DAILY FOR FEVER BLISTERS PRN)   allopurinol (ZYLOPRIM) 300 MG tablet TAKE 1 TABLET BY MOUTH EVERY DAY    ALPRAZolam (XANAX) 1 MG tablet Take 1/2-1 tablet 2 - 3 x /day ONLY if needed for Anxiety Attack &  limit to 5 days /week to avoid addiction (Patient taking differently: 1 mg daily as needed. Take 1/2-1 tablet 2 - 3 x /day ONLY if needed for Anxiety Attack &  limit to 5 days /week to avoid addiction)   atorvastatin (LIPITOR) 80 MG tablet TAKE 1 TABLET BY MOUTH EVERY DAY AS DIRECTED FOR CHOLESTEROL   Cholecalciferol (VITAMIN D3) 400 units CAPS Take 5,000 Units by mouth daily.    hydrochlorothiazide (HYDRODIURIL) 25 MG tablet TAKE 1 TABLET BY MOUTH EVERY DAY FOR BLOOD PRESSURE AND FLUID   ibuprofen (ADVIL,MOTRIN) 200 MG tablet Take by mouth as needed.    loratadine-pseudoephedrine (CLARITIN-D 24 HOUR) 10-240 MG 24 hr tablet Take 1 tablet by mouth daily.   OVER THE COUNTER MEDICATION 2 tablets as needed. OTC pepcid for indigestion    propranolol ER (INDERAL LA) 120 MG 24 hr capsule TAKE 1 CAPSULE DAILY FOR BP & TREMOR   Past Medical History:  Diagnosis Date   Anxiety    Encounter for long-term (current) use of other medications    Hyperlipidemia    Hypertension    Hypogonadism male    Vitamin D deficiency    Past Surgical History:  Procedure Laterality Date   HERNIA REPAIR     I&D EXTREMITY Right 06/19/2012   Procedure: IRRIGATION AND DEBRIDEMENT FOREARM;  Surgeon: Tennis Must, MD;  Location: [facility];  Service: Orthopedics;  Laterality: Right;   KNEE ARTHROSCOPY WITH  ANTERIOR CRUCIATE LIGAMENT (ACL) REPAIR Left Jan 2017   Dr. 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   Review of Systems All other ROS negative

## 2018-06-13 NOTE — Addendum Note (Signed)
Addended by: [place], Madison Direnzo E on: 06/13/2018 06:14 PM   Modules accepted: Orders

## 2018-06-16 ENCOUNTER — Ambulatory Visit: Payer: Self-pay | Admitting: Adult Health

## 2018-06-16 ENCOUNTER — Other Ambulatory Visit: Payer: Self-pay | Admitting: Internal Medicine

## 2018-06-16 MED ORDER — ALPRAZOLAM 1 MG PO TABS: ORAL_TABLET | ORAL | 0 refills | Status: DC

## 2018-06-24 ENCOUNTER — Telehealth: Payer: Self-pay | Admitting: Internal Medicine

## 2018-06-24 NOTE — Telephone Encounter (Signed)
LM on vmail to call back regarding pre-screening questions    Covid-19 Screening Questions: Do you now or have you had a fever in the last 14 days?  Do you have any respiratory symptoms of shortness of breath or cough now or in the last 14 days?  Do you have any family members or close contacts with diagnosed or suspected Covid-19 in the past 14 days?  Have you been tested for Covid-19 and found to be positive?

## 2018-06-24 NOTE — Telephone Encounter (Signed)
Pt returned call  Covid-19 Screening Questions     Do you now or have you had a fever in the last 14 days?     No   Do you have any respiratory symptoms of shortness of breath or cough now or in the last 14 days?    No   Do you have any family members or close contacts with diagnosed or suspected Covid-19 in the past 14 days?     No   Have you been tested for Covid-19 and found to be positive?    No   Pt made aware of that care partner may come to the lobby during the procedure but will need to provide their own mask.

## 2018-06-25 ENCOUNTER — Ambulatory Visit (AMBULATORY_SURGERY_CENTER): Payer: 59 | Admitting: Internal Medicine

## 2018-06-25 ENCOUNTER — Encounter: Payer: Self-pay | Admitting: Internal Medicine

## 2018-06-25 ENCOUNTER — Other Ambulatory Visit: Payer: Self-pay

## 2018-06-25 VITALS — BP 135/92 | HR 77 | Temp 98.4°F | Resp 21 | Ht 71.0 in | Wt 204.0 lb

## 2018-06-25 DIAGNOSIS — R6881 Early satiety: Secondary | ICD-10-CM

## 2018-06-25 DIAGNOSIS — K2970 Gastritis, unspecified, without bleeding: Secondary | ICD-10-CM | POA: Diagnosis not present

## 2018-06-25 DIAGNOSIS — K2270 Barrett's esophagus without dysplasia: Secondary | ICD-10-CM

## 2018-06-25 DIAGNOSIS — D124 Benign neoplasm of descending colon: Secondary | ICD-10-CM

## 2018-06-25 DIAGNOSIS — D122 Benign neoplasm of ascending colon: Secondary | ICD-10-CM | POA: Diagnosis not present

## 2018-06-25 DIAGNOSIS — K3189 Other diseases of stomach and duodenum: Secondary | ICD-10-CM

## 2018-06-25 DIAGNOSIS — D123 Benign neoplasm of transverse colon: Secondary | ICD-10-CM | POA: Diagnosis not present

## 2018-06-25 DIAGNOSIS — D120 Benign neoplasm of cecum: Secondary | ICD-10-CM | POA: Diagnosis not present

## 2018-06-25 DIAGNOSIS — Z1211 Encounter for screening for malignant neoplasm of colon: Secondary | ICD-10-CM | POA: Diagnosis present

## 2018-06-25 MED ORDER — SODIUM CHLORIDE 0.9 % IV SOLN: 500.0000 mL | Freq: Once | INTRAVENOUS | Status: DC

## 2018-06-25 NOTE — Progress Notes (Signed)
Called to room to assist during endoscopic procedure.  Patient ID and intended procedure confirmed with present staff. Received instructions for my participation in the procedure from the performing physician.  

## 2018-06-25 NOTE — Op Note (Signed)
[facility] Patient Name: Joshua Kerr Procedure Date: 06/25/2018 3:26 PM MRN: [id number] Endoscopist: Gatha Mayer , MD Age: 53 Referring MD:  Date of Birth: 1965-01-25 Gender: Male Account #: [id number] Procedure:                Upper GI endoscopy Indications:              Early satiety, Family history of gastric cancer Medicines:                Propofol per Anesthesia, Monitored Anesthesia Care Procedure:                Pre-Anesthesia Assessment:                           - Prior to the procedure, a History and Physical                            was performed, and patient medications and                            allergies were reviewed. The patient's tolerance of                            previous anesthesia was also reviewed. The risks                            and benefits of the procedure and the sedation                            options and risks were discussed with the patient.                            All questions were answered, and informed consent                            was obtained. Prior Anticoagulants: The patient has                            taken no previous anticoagulant or antiplatelet                            agents. ASA Grade Assessment: II - A patient with                            mild systemic disease. After reviewing the risks                            and benefits, the patient was deemed in                            satisfactory condition to undergo the procedure.                           After obtaining informed consent, the endoscope was  passed under direct vision. Throughout the                            procedure, the patient's blood pressure, pulse, and                            oxygen saturations were monitored continuously. The                            Endoscope was introduced through the mouth, and                            advanced to the second part of duodenum. The upper                 GI endoscopy was accomplished without difficulty.                            The patient tolerated the procedure well. Scope In: Scope Out: Findings:                 There were esophageal mucosal changes suggestive of                            short-segment Barrett's esophagus present in the                            distal esophagus. The maximum longitudinal extent                            of these mucosal changes was 2 cm in length. Mucosa                            was biopsied with a cold forceps for histology. One                            specimen bottle was sent to pathology. Verification                            of patient identification for the specimen was                            done. Estimated blood loss was minimal.                           Diffuse mild inflammation characterized by erythema                            and friability was found in the gastric body and in                            the gastric antrum. Biopsies were taken with a cold                            forceps for  histology. Verification of patient                            identification for the specimen was done. Estimated                            blood loss was minimal.                           The exam was otherwise without abnormality.                           The cardia and gastric fundus were normal on                            retroflexion. Complications:            No immediate complications. Estimated Blood Loss:     Estimated blood loss was minimal. Impression:               - Esophageal mucosal changes suggestive of                            short-segment Barrett's esophagus. Biopsied. 3                            tongues 35-37 cm                           - Gastritis. Biopsied.                           - The examination was otherwise normal. Recommendation:           - Patient has a contact number available for                            emergencies. The  signs and symptoms of potential                            delayed complications were discussed with the                            patient. Return to normal activities tomorrow.                            Written discharge instructions were provided to the                            patient.                           - Resume previous diet.                           - Continue present medications.                           -  See the other procedure note for documentation of                            additional recommendations. Gatha Mayer, MD 06/25/2018 4:09:42 PM This report has been signed electronically.

## 2018-06-25 NOTE — Progress Notes (Signed)
A/ox3, pleased with MAC, report to RN 

## 2018-06-25 NOTE — Progress Notes (Signed)
Pt's states no medical or surgical changes since previsit or office visit. 

## 2018-06-25 NOTE — Progress Notes (Signed)
FOLLOW UP  Assessment and Plan:   Hypertension Well controlled with current medications  Monitor blood pressure at home; patient to call if consistently greater than 130/80 Continue DASH diet.   Reminder to go to the ER if any CP, SOB, nausea, dizziness, severe HA, changes vision/speech, left arm numbness and tingling and jaw pain.  Cholesterol Currently at goal; continue atorvastatin 80 mg daily Continue low cholesterol diet and exercise.  Check lipid panel.   Other abnormal glucose Recent A1Cs at goal Discussed diet/exercise, weight management  Defer A1C; check CMP  GERD Well managed on current medications; Discussed diet, avoiding triggers and other lifestyle changes  Overweight Long discussion about weight loss, diet, and exercise Recommended diet heavy in fruits and veggies and low in animal meats, cheeses, and dairy products, appropriate calorie intake Discussed ideal weight for height Will follow up in 3 months  Vitamin D Def At goal at last visit; continue supplementation to maintain goal of 70-100 Defer Vit D level  Anxiety Well managed by current regimen; he reports daytime stress is fairly managed and wants to avoid a daily medication. continue PRN medications Stress management techniques discussed, increase water, good sleep hygiene discussed, increase exercise, and increase veggies.   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 Further disposition pending results of labs. Discussed med's effects and SE's.   Over 30  minutes of exam, counseling, chart review, and critical decision making was performed.   Future Appointments  Date Time Provider [place]  09/22/2018  9:30 AM Unk Pinto, MD [facility] None  03/23/2019  9:00 AM Unk Pinto, MD [facility] None    ----------------------------------------------------------------------------------------------------------------------  HPI 53 y.o. male  presents for 3 month follow up on hypertension, cholesterol, prediabetes, GERD, anxiety, weight and vitamin D deficiency.   he has a diagnosis of anxiety and is currently on xanax 1 mg TID PRN - he currently typically takes 1 tab at night for sleep after stressful day at work, uses very rarely during the day - previously treated by [place] with severe SE, reports symptoms are well controlled on current regimen.  Hasn't done well trying to switch agents, declines alternative daily agent at this time.   he has a diagnosis of GERD which is currently managed by PRN pepcid 40 mg daily (OTC) he reports symptoms is currently well controlled, and denies breakthrough reflux, burning in chest, hoarseness or cough.    BMI is Body mass index is 28.01 kg/m., he has been working on diet but admits less exercise due to gyms closing.   Wt Readings from Last 3 Encounters:  06/26/18 200 lb 12.8 oz (91.1 kg)  06/25/18 204 lb (92.5 kg)  06/12/18 204 lb (92.5 kg)   His blood pressure has been controlled at home, today their BP is BP: 110/74  He does workout. He denies chest pain, shortness of breath, dizziness.   He is on cholesterol medication (atorvastatin 40 mg daily) and denies myalgias. His cholesterol is not at goal. The cholesterol last visit was:   Lab Results  Component Value Date   CHOL 188 03/03/2018   HDL 59 03/03/2018  LDLCALC 102 (H) 03/03/2018   TRIG 176 (H) 03/03/2018   CHOLHDL 3.2 03/03/2018    He has not been working on diet and exercise for glucose management, and denies increased  appetite, nausea, paresthesia of the feet, polydipsia, polyuria, visual disturbances and vomiting. Last A1C in the office was:  Lab Results  Component Value Date   HGBA1C 5.6 03/03/2018   Patient is on Vitamin D supplement and at goal at recent check:    Lab Results  Component Value Date   VD25OH 68 03/03/2018     Patient is on allopurinol for gout, was off at last check but restarted after he started having flare symptoms.  Lab Results  Component Value Date   LABURIC 10.0 (H) 03/03/2018    It was noted today while reviewing labs that this patient has had persistent proteinuria, microscopic hematuria over the last several years, persistent microalbuminuria:   Component     Latest Ref Rng & Units 03/03/2018  Creatinine, Urine     20 - 320 mg/dL 70  Microalb, Ur     mg/dL 64.1  MICROALB/CREAT RATIO     <30 mcg/mg creat 916 (H)      Current Medications:  Current Outpatient Medications on File Prior to Visit  Medication Sig  . acyclovir (ZOVIRAX) 800 MG tablet TAKE 1 TABLET DAILY FOR FEVER BLISTERS (Patient taking differently: TAKE 1 TABLET DAILY FOR FEVER BLISTERS PRN)  . allopurinol (ZYLOPRIM) 300 MG tablet TAKE 1 TABLET BY MOUTH EVERY DAY  . ALPRAZolam (XANAX) 1 MG tablet Take 1/2-1 tablet 2 - 3 x /day ONLY if needed for Anxiety Attack &  limit to 5 days /week to avoid addiction  . atorvastatin (LIPITOR) 80 MG tablet TAKE 1 TABLET BY MOUTH EVERY DAY AS DIRECTED FOR CHOLESTEROL  . Cholecalciferol (VITAMIN D3) 400 units CAPS Take 5,000 Units by mouth daily.   . hydrochlorothiazide (HYDRODIURIL) 25 MG tablet TAKE 1 TABLET BY MOUTH EVERY DAY FOR BLOOD PRESSURE AND FLUID  . ibuprofen (ADVIL,MOTRIN) 200 MG tablet Take by mouth as needed.   . loratadine-pseudoephedrine (CLARITIN-D 24 HOUR) 10-240 MG 24 hr tablet Take 1 tablet by mouth daily. (Patient taking differently: Take 1 tablet by mouth as needed. )  . OVER THE COUNTER MEDICATION 2 tablets as needed. OTC pepcid for indigestion    . propranolol ER (INDERAL LA) 120 MG 24 hr capsule TAKE 1 CAPSULE DAILY FOR BP & TREMOR   No current facility-administered medications on file prior to visit.      Allergies:  Allergies  Allergen Reactions  . Citalopram     dyphoria     Medical History:  Past Medical History:  Diagnosis Date  . Anxiety   . Encounter for long-term (current) use of other medications   . Hyperlipidemia   . Hypertension   . Hypogonadism male   . Vitamin D deficiency    Family history- Reviewed and unchanged Social history- Reviewed and unchanged   Review of Systems:  Review of Systems  Constitutional: Negative for malaise/fatigue and weight loss.  HENT: Negative for hearing loss and tinnitus.   Eyes: Negative for blurred vision and double vision.  Respiratory: Negative for cough, shortness of breath and wheezing.   Cardiovascular: Negative for chest pain, palpitations, orthopnea, claudication and leg swelling.  Gastrointestinal: Negative for abdominal pain, blood in stool, constipation, diarrhea, heartburn, melena, nausea and vomiting.  Genitourinary: Negative.   Musculoskeletal: Negative for joint pain and myalgias.  Skin: Negative for rash.  Neurological: Negative  for dizziness, tingling, sensory change, weakness and headaches.  Endo/Heme/Allergies: Negative for polydipsia.  Psychiatric/Behavioral: Negative.   All other systems reviewed and are negative.   Physical Exam: BP 110/74   Pulse 79   Temp 97.7 F (36.5 C)   Wt 200 lb 12.8 oz (91.1 kg)   SpO2 97%   BMI 28.01 kg/m  Wt Readings from Last 3 Encounters:  06/26/18 200 lb 12.8 oz (91.1 kg)  06/25/18 204 lb (92.5 kg)  06/12/18 204 lb (92.5 kg)   General Appearance: Well nourished, in no apparent distress. Eyes: PERRLA, EOMs, conjunctiva no swelling or erythema Sinuses: No Frontal/maxillary tenderness ENT/Mouth: Ext aud canals clear, TMs without erythema, bulging. No erythema, swelling, or exudate on post pharynx.   Tonsils not swollen or erythematous. Hearing normal.  Neck: Supple, thyroid normal.  Respiratory: Respiratory effort normal, BS equal bilaterally without rales, rhonchi, wheezing or stridor.  Cardio: RRR with no MRGs. Brisk peripheral pulses without edema.  Abdomen: Soft, + BS.  Non tender, no guarding, rebound, hernias, masses. Lymphatics: Non tender without lymphadenopathy.  Musculoskeletal: Full ROM, 5/5 strength, Normal gait Skin: Warm, dry without rashes, lesions, ecchymosis.  Neuro: Cranial nerves intact. No cerebellar symptoms.  Psych: Awake and oriented X 3, normal affect, Insight and Judgment appropriate.    Izora Ribas, NP 10:58 AM [facility]

## 2018-06-25 NOTE — Patient Instructions (Addendum)
,cegl   Nothing bad here but good you came in.   On the EGD - looks like you have a condition called Barrett's esophagus (see handout) and some gastritis (stomach inflammation). Biopsies tajken.  On the colonoscopy I removed 5 tiny polyps.  I will let you know pathology results and plans soon.  I appreciate the opportunity to care for you. Gatha Mayer, MD, [facility]  Please read handouts provided. Await pathology results. Continue present medications.     YOU HAD AN ENDOSCOPIC PROCEDURE TODAY AT [facility]:   Refer to the procedure report that was given to you for any specific questions about what was found during the examination.  If the procedure report does not answer your questions, please call your gastroenterologist to clarify.  If you requested that your care partner not be given the details of your procedure findings, then the procedure report has been included in a sealed envelope for you to review at your convenience later.  YOU SHOULD EXPECT: Some feelings of bloating in the abdomen. Passage of more gas than usual.  Walking can help get rid of the air that was put into your GI tract during the procedure and reduce the bloating. If you had a lower endoscopy (such as a colonoscopy or flexible sigmoidoscopy) you may notice spotting of blood in your stool or on the toilet paper. If you underwent a bowel prep for your procedure, you may not have a normal bowel movement for a few days.  Please Note:  You might notice some irritation and congestion in your nose or some drainage.  This is from the oxygen used during your procedure.  There is no need for concern and it should clear up in a day or so.  SYMPTOMS TO REPORT IMMEDIATELY:   Following lower endoscopy (colonoscopy or flexible sigmoidoscopy):  Excessive amounts of blood in the stool  Significant tenderness or worsening of abdominal pains  Swelling of the abdomen that is new, acute  Fever of 100F or  higher   Following upper endoscopy (EGD)  Vomiting of blood or coffee ground material  New chest pain or pain under the shoulder blades  Painful or persistently difficult swallowing  New shortness of breath  Fever of 100F or higher  Black, tarry-looking stools  For urgent or emergent issues, a gastroenterologist can be reached at any hour by calling [phone].   DIET:  We do recommend a small meal at first, but then you may proceed to your regular diet.  Drink plenty of fluids but you should avoid alcoholic beverages for 24 hours.  ACTIVITY:  You should plan to take it easy for the rest of today and you should NOT DRIVE or use heavy machinery until tomorrow (because of the sedation medicines used during the test).    FOLLOW UP: Our staff will call the number listed on your records 48-72 hours following your procedure to check on you and address any questions or concerns that you may have regarding the information given to you following your procedure. If we do not reach you, we will leave a message.  We will attempt to reach you two times.  During this call, we will ask if you have developed any symptoms of COVID 19. If you develop any symptoms (ie: fever, flu-like symptoms, shortness of breath, cough etc.) before then, please call [phone].  If you test positive for Covid 19 in the 2 weeks post procedure, please call and report this information to  [place].    If any biopsies were taken you will be contacted by phone or by letter within the next 1-3 weeks.  Please call us at [phone] if you have not heard about the biopsies in 3 weeks.    SIGNATURES/CONFIDENTIALITY: You and/or your care partner have signed paperwork which will be entered into your electronic medical record.  These signatures attest to the fact that that the information above on your After Visit Summary has been reviewed and is understood.  Full responsibility of the confidentiality of this discharge information  lies with you and/or your care-partner.

## 2018-06-25 NOTE — Op Note (Signed)
[facility] Patient Name: Joshua Kerr Procedure Date: 06/25/2018 3:25 PM MRN: [id number] Endoscopist: Gatha Mayer , MD Age: 53 Referring MD:  Date of Birth: 12/09/1965 Gender: Male Account #: [id number] Procedure:                Colonoscopy Indications:              Screening for colorectal malignant neoplasm, This                            is the patient's first colonoscopy Medicines:                Propofol per Anesthesia, Monitored Anesthesia Care Procedure:                Pre-Anesthesia Assessment:                           - Prior to the procedure, a History and Physical                            was performed, and patient medications and                            allergies were reviewed. The patient's tolerance of                            previous anesthesia was also reviewed. The risks                            and benefits of the procedure and the sedation                            options and risks were discussed with the patient.                            All questions were answered, and informed consent                            was obtained. Prior Anticoagulants: The patient has                            taken no previous anticoagulant or antiplatelet                            agents. ASA Grade Assessment: II - A patient with                            mild systemic disease. After reviewing the risks                            and benefits, the patient was deemed in                            satisfactory condition to undergo the procedure.  After obtaining informed consent, the colonoscope                            was passed under direct vision. Throughout the                            procedure, the patient's blood pressure, pulse, and                            oxygen saturations were monitored continuously. The                            Colonoscope was introduced through the anus and   advanced to the the cecum, identified by                            appendiceal orifice and ileocecal valve. The                            colonoscopy was performed without difficulty. The                            patient tolerated the procedure well. The quality                            of the bowel preparation was excellent. The                            ileocecal valve, appendiceal orifice, and rectum                            were photographed. The bowel preparation used was                            Miralax via split dose instruction. Scope In: 3:48:36 PM Scope Out: 4:01:48 PM Scope Withdrawal Time: 0 hours 9 minutes 36 seconds  Total Procedure Duration: 0 hours 13 minutes 12 seconds  Findings:                 The perianal and digital rectal examinations were                            normal. Pertinent negatives include normal prostate                            (size, shape, and consistency).                           Three sessile polyps were found in the descending                            colon and transverse colon. The polyps were                            diminutive in size. These polyps were  removed with                            a cold snare. Resection and retrieval were                            complete. Verification of patient identification                            for the specimen was done. Estimated blood loss was                            minimal.                           Two sessile polyps were found in the ascending                            colon and ileocecal valve. The polyps were                            diminutive in size. These polyps were removed with                            a cold biopsy forceps. Resection and retrieval were                            complete. Verification of patient identification                            for the specimen was done. Estimated blood loss was                            minimal.                            The exam was otherwise without abnormality on                            direct and retroflexion views. Complications:            No immediate complications. Estimated Blood Loss:     Estimated blood loss was minimal. Impression:               - Three diminutive polyps in the descending colon                            and in the transverse colon, removed with a cold                            snare. Resected and retrieved.                           - Two diminutive polyps in the ascending colon and  at the ileocecal valve, removed with a cold biopsy                            forceps. Resected and retrieved.                           - The examination was otherwise normal on direct                            and retroflexion views. Recommendation:           - Patient has a contact number available for                            emergencies. The signs and symptoms of potential                            delayed complications were discussed with the                            patient. Return to normal activities tomorrow.                            Written discharge instructions were provided to the                            patient.                           - Resume previous diet.                           - Continue present medications.                           - Repeat colonoscopy is recommended. The                            colonoscopy date will be determined after pathology                            results from today's exam become available for                            review. Gatha Mayer, MD 06/25/2018 4:13:00 PM This report has been signed electronically.

## 2018-06-26 ENCOUNTER — Ambulatory Visit: Payer: 59 | Admitting: Adult Health

## 2018-06-26 ENCOUNTER — Encounter: Payer: Self-pay | Admitting: Adult Health

## 2018-06-26 ENCOUNTER — Other Ambulatory Visit: Payer: Self-pay

## 2018-06-26 VITALS — BP 110/74 | HR 79 | Temp 97.7°F | Wt 200.8 lb

## 2018-06-26 DIAGNOSIS — R809 Proteinuria, unspecified: Secondary | ICD-10-CM

## 2018-06-26 DIAGNOSIS — E663 Overweight: Secondary | ICD-10-CM | POA: Diagnosis not present

## 2018-06-26 DIAGNOSIS — R7309 Other abnormal glucose: Secondary | ICD-10-CM

## 2018-06-26 DIAGNOSIS — K760 Fatty (change of) liver, not elsewhere classified: Secondary | ICD-10-CM

## 2018-06-26 DIAGNOSIS — R801 Persistent proteinuria, unspecified: Secondary | ICD-10-CM

## 2018-06-26 DIAGNOSIS — I10 Essential (primary) hypertension: Secondary | ICD-10-CM | POA: Diagnosis not present

## 2018-06-26 DIAGNOSIS — F419 Anxiety disorder, unspecified: Secondary | ICD-10-CM

## 2018-06-26 DIAGNOSIS — M1A9XX0 Chronic gout, unspecified, without tophus (tophi): Secondary | ICD-10-CM

## 2018-06-26 DIAGNOSIS — R3129 Other microscopic hematuria: Secondary | ICD-10-CM

## 2018-06-26 DIAGNOSIS — E782 Mixed hyperlipidemia: Secondary | ICD-10-CM

## 2018-06-26 DIAGNOSIS — E559 Vitamin D deficiency, unspecified: Secondary | ICD-10-CM | POA: Diagnosis not present

## 2018-06-26 DIAGNOSIS — Z79899 Other long term (current) drug therapy: Secondary | ICD-10-CM | POA: Diagnosis not present

## 2018-06-26 NOTE — Patient Instructions (Addendum)
Goals   None     Fatty liver or Nonalcoholic fatty liver disease (NASH)  Now the leading cause of liver failure in the [place].  It is normally from such risk factors as obesity, diabetes, insulin resistance, high cholesterol, or metabolic syndrome.  The only definitive therapy is weight loss and exercise.    Suggest walking 20-30 mins daily.  Decreasing carbohydrates, increasing veggies.  Vitamin E 800 IU a day may be beneficial. Liver cancer has been noted in patient with fatty liver without cirrhosis.  Will monitor closely   Fatty Liver Fatty liver is the accumulation of fat in liver cells. It is also called hepatosteatosis or steatohepatitis. It is normal for your liver to contain some fat. If fat is more than 5 to 10% of your liver's weight, you have fatty liver.  There are often no symptoms (problems) for years while damage is still occurring. People often learn about their fatty liver when they have medical tests for other reasons. Fat can damage your liver for years or even decades without causing problems. When it becomes severe, it can cause fatigue, weight loss, weakness, and confusion. This makes you more likely to develop more serious liver problems. The liver is the largest organ in the body. It does a lot of work and often gives no warning signs when it is sick until late in a disease. The liver has many important jobs including:  Breaking down foods.  Storing vitamins, iron, and other minerals.  Making proteins.  Making bile for food digestion.  Breaking down many products including medications, alcohol and some poisons.  PROGNOSIS  Fatty liver may cause no damage or it can lead to an inflammation of the liver. This is, called steatohepatitis.  Over time the liver may become scarred and hardened. This condition is called cirrhosis. Cirrhosis is serious and may lead to liver failure or cancer. NASH is one of the leading causes of cirrhosis. About 10-20% of  Americans have fatty liver and a smaller 2-5% has NASH.  TREATMENT   Weight loss, fat restriction, and exercise in overweight patients produces inconsistent results but is worth trying.  Good control of diabetes may reduce fatty liver.  Eat a balanced, healthy diet.  Increase your physical activity.  There are no medical or surgical treatments for a fatty liver or NASH, but improving your diet and increasing your exercise may help prevent or reverse some of the damage.      Gout  Gout is a condition that causes painful swelling of the joints. Gout is a type of inflammation of the joints (arthritis). This condition is caused by having too much uric acid in the body. Uric acid is a chemical that forms when the body breaks down substances called purines. Purines are important for building body proteins. When the body has too much uric acid, sharp crystals can form and build up inside the joints. This causes pain and swelling. Gout attacks can happen quickly and may be very painful (acute gout). Over time, the attacks can affect more joints and become more frequent (chronic gout). Gout can also cause uric acid to build up under the skin and inside the kidneys. What are the causes? This condition is caused by too much uric acid in your blood. This can happen because:  Your kidneys do not remove enough uric acid from your blood. This is the most common cause.  Your body makes too much uric acid. This can happen with some cancers and  cancer treatments. It can also occur if your body is breaking down too many red blood cells (hemolytic anemia).  You eat too many foods that are high in purines. These foods include organ meats and some seafood. Alcohol, especially beer, is also high in purines. A gout attack may be triggered by trauma or stress. What increases the risk? You are more likely to develop this condition if you:  Have a family history of gout.  Are male and middle-aged.  Are  male and have gone through menopause.  Are obese.  Frequently drink alcohol, especially beer.  Are dehydrated.  Lose weight too quickly.  Have an organ transplant.  Have lead poisoning.  Take certain medicines, including aspirin, cyclosporine, diuretics, levodopa, and niacin.  Have kidney disease.  Have a skin condition called psoriasis. What are the signs or symptoms? An attack of acute gout happens quickly. It usually occurs in just one joint. The most common place is the big toe. Attacks often start at night. Other joints that may be affected include joints of the feet, ankle, knee, fingers, wrist, or elbow. Symptoms of this condition may include:  Severe pain.  Warmth.  Swelling.  Stiffness.  Tenderness. The affected joint may be very painful to touch.  Shiny, red, or purple skin.  Chills and fever. Chronic gout may cause symptoms more frequently. More joints may be involved. You may also have white or yellow lumps (tophi) on your hands or feet or in other areas near your joints. How is this diagnosed? This condition is diagnosed based on your symptoms, medical history, and physical exam. You may have tests, such as:  Blood tests to measure uric acid levels.  Removal of joint fluid with a thin needle (aspiration) to look for uric acid crystals.  X-rays to look for joint damage. How is this treated? Treatment for this condition has two phases: treating an acute attack and preventing future attacks. Acute gout treatment may include medicines to reduce pain and swelling, including:  NSAIDs.  Steroids. These are strong anti-inflammatory medicines that can be taken by mouth (orally) or injected into a joint.  Colchicine. This medicine relieves pain and swelling when it is taken soon after an attack. It can be given by mouth or through an IV. Preventive treatment may include:  Daily use of smaller doses of NSAIDs or colchicine.  Use of a medicine that reduces  uric acid levels in your blood.  Changes to your diet. You may need to see a dietitian about what to eat and drink to prevent gout. Follow these instructions at home: During a gout attack   If directed, put ice on the affected area: ? Put ice in a plastic bag. ? Place a towel between your skin and the bag. ? Leave the ice on for 20 minutes, 2-3 times a day.  Raise (elevate) the affected joint above the level of your heart as often as possible.  Rest the joint as much as possible. If the affected joint is in your leg, you may be given crutches to use.  Follow instructions from your health care provider about eating or drinking restrictions. Avoiding future gout attacks  Follow a low-purine diet as told by your dietitian or health care provider. Avoid foods and drinks that are high in purines, including liver, kidney, anchovies, asparagus, herring, mushrooms, mussels, and beer.  Maintain a healthy weight or lose weight if you are overweight. If you want to lose weight, talk with your health care provider.  It is important that you do not lose weight too quickly.  Start or maintain an exercise program as told by your health care provider. Eating and drinking  Drink enough fluids to keep your urine pale yellow.  If you drink alcohol: ? Limit how much you use to:  0-1 drink a day for women.  0-2 drinks a day for men. ? Be aware of how much alcohol is in your drink. In the [place] one drink equals one 12 oz bottle of beer (355 mL) one 5 oz glass of wine (148 mL), or one 1 oz glass of hard liquor (44 mL). General instructions  Take over-the-counter and prescription medicines only as told by your health care provider.  Do not drive or use heavy machinery while taking prescription pain medicine.  Return to your normal activities as told by your health care provider. Ask your health care provider what activities are safe for you.  Keep all follow-up visits as told by your health care  provider. This is important. Contact a health care provider if you have:  Another gout attack.  Continuing symptoms of a gout attack after 10 days of treatment.  Side effects from your medicines.  Chills or a fever.  Burning pain when you urinate.  Pain in your lower back or belly. Get help right away if you:  Have severe or uncontrolled pain.  Cannot urinate. Summary  Gout is painful swelling of the joints caused by inflammation.  The most common site of pain is the big toe, but it can affect other joints in the body.  Medicines and dietary changes can help to prevent and treat gout attacks. This information is not intended to replace advice given to you by your health care provider. Make sure you discuss any questions you have with your health care provider. Document Released: 12/16/1999 Document Revised: 07/10/2017 Document Reviewed: 07/10/2017 Elsevier Interactive Patient Education  2019 [employer]      Hematuria, Adult Hematuria is blood in the urine. Blood may be visible in the urine, or it may be identified with a test. This condition can be caused by infections of the bladder, urethra, kidney, or prostate. Other possible causes include:  Kidney stones.  Cancer of the urinary tract.  Too much calcium in the urine.  Conditions that are passed from parent to child (inherited conditions).  Exercise that requires a lot of energy. Infections can usually be treated with medicine, and a kidney stone usually will pass through your urine. If neither of these is the cause of your hematuria, more tests may be needed to identify the cause of your symptoms. It is very important to tell your health care provider about any blood in your urine, even if it is painless or the blood stops without treatment. Blood in the urine, when it happens and then stops and then happens again, can be a symptom of a very serious condition, including cancer. There is no pain in the initial  stages of many urinary cancers. Follow these instructions at home: Medicines  Take over-the-counter and prescription medicines only as told by your health care provider.  If you were prescribed an antibiotic medicine, take it as told by your health care provider. Do not stop taking the antibiotic even if you start to feel better. Eating and drinking  Drink enough fluid to keep your urine clear or pale yellow. It is recommended that you drink 3-4 quarts (2.8-3.8 L) a day. If you have been diagnosed with an  infection, it is recommended that you drink cranberry juice in addition to large amounts of water.  Avoid caffeine, tea, and carbonated beverages. These tend to irritate the bladder.  Avoid alcohol because it may irritate the prostate (men). General instructions  If you have been diagnosed with a kidney stone, follow your health care provider's instructions about straining your urine to catch the stone.  Empty your bladder often. Avoid holding urine for long periods of time.  If you are male: ? After a bowel movement, wipe from front to back and use each piece of toilet paper only once. ? Empty your bladder before and after sex.  Pay attention to any changes in your symptoms. Tell your health care provider about any changes or any new symptoms.  It is your responsibility to get your test results. Ask your health care provider, or the department performing the test, when your results will be ready.  Keep all follow-up visits as told by your health care provider. This is important. Contact a health care provider if:  You develop back pain.  You have a fever.  You have nausea or vomiting.  Your symptoms do not improve after 3 days.  Your symptoms get worse. Get help right away if:  You develop severe vomiting and are unable take medicine without vomiting.  You develop severe pain in your back or abdomen even though you are taking medicine.  You pass a large amount of  blood in your urine.  You pass blood clots in your urine.  You feel very weak or like you might faint.  You faint. Summary  Hematuria is blood in the urine. It has many possible causes.  It is very important that you tell your health care provider about any blood in your urine, even if it is painless or the blood stops without treatment.  Take over-the-counter and prescription medicines only as told by your health care provider.  Drink enough fluid to keep your urine clear or pale yellow. This information is not intended to replace advice given to you by your health care provider. Make sure you discuss any questions you have with your health care provider. Document Released: 12/18/2004 Document Revised: 01/21/2016 Document Reviewed: 01/21/2016 Elsevier Interactive Patient Education  2019 [employer].

## 2018-06-27 ENCOUNTER — Telehealth: Payer: Self-pay

## 2018-06-27 LAB — CBC WITH DIFFERENTIAL/PLATELET
Absolute Monocytes: 793 cells/uL (ref 200–950)
Basophils Absolute: 39 cells/uL (ref 0–200)
Basophils Relative: 0.6 %
Eosinophils Absolute: 221 cells/uL (ref 15–500)
Eosinophils Relative: 3.4 %
HCT: 44.8 % (ref 38.5–50.0)
Hemoglobin: 15.9 g/dL (ref 13.2–17.1)
Lymphs Abs: 982 cells/uL (ref 850–3900)
MCH: 35.2 pg — ABNORMAL HIGH (ref 27.0–33.0)
MCHC: 35.5 g/dL (ref 32.0–36.0)
MCV: 99.1 fL (ref 80.0–100.0)
MPV: 10.7 fL (ref 7.5–12.5)
Monocytes Relative: 12.2 %
Neutro Abs: 4466 cells/uL (ref 1500–7800)
Neutrophils Relative %: 68.7 %
Platelets: 198 10*3/uL (ref 140–400)
RBC: 4.52 10*6/uL (ref 4.20–5.80)
RDW: 13.3 % (ref 11.0–15.0)
Total Lymphocyte: 15.1 %
WBC: 6.5 10*3/uL (ref 3.8–10.8)

## 2018-06-27 LAB — COMPLETE METABOLIC PANEL WITH GFR
AG Ratio: 1.5 (calc) (ref 1.0–2.5)
ALT: 97 U/L — ABNORMAL HIGH (ref 9–46)
AST: 94 U/L — ABNORMAL HIGH (ref 10–35)
Albumin: 4 g/dL (ref 3.6–5.1)
Alkaline phosphatase (APISO): 61 U/L (ref 35–144)
BUN: 17 mg/dL (ref 7–25)
CO2: 31 mmol/L (ref 20–32)
Calcium: 9.5 mg/dL (ref 8.6–10.3)
Chloride: 97 mmol/L — ABNORMAL LOW (ref 98–110)
Creat: 1.31 mg/dL (ref 0.70–1.33)
GFR, Est African American: 73 mL/min/{1.73_m2} (ref >=60)
GFR, Est Non African American: 63 mL/min/{1.73_m2} (ref >=60)
Globulin: 2.7 g/dL (calc) (ref 1.9–3.7)
Glucose, Bld: 136 mg/dL — ABNORMAL HIGH (ref 65–99)
Potassium: 3.5 mmol/L (ref 3.5–5.3)
Sodium: 138 mmol/L (ref 135–146)
Total Bilirubin: 1.4 mg/dL — ABNORMAL HIGH (ref 0.2–1.2)
Total Protein: 6.7 g/dL (ref 6.1–8.1)

## 2018-06-27 LAB — LIPID PANEL
Cholesterol: 152 mg/dL (ref <200)
HDL: 50 mg/dL (ref >=40)
LDL Cholesterol (Calc): 76 mg/dL (calc)
Non-HDL Cholesterol (Calc): 102 mg/dL (calc) (ref <130)
Total CHOL/HDL Ratio: 3 (calc) (ref <5.0)
Triglycerides: 164 mg/dL — ABNORMAL HIGH (ref <150)

## 2018-06-27 LAB — URIC ACID: Uric Acid, Serum: 7.8 mg/dL (ref 4.0–8.0)

## 2018-06-27 LAB — MAGNESIUM: Magnesium: 2.2 mg/dL (ref 1.5–2.5)

## 2018-06-27 LAB — TSH: TSH: 1.72 mIU/L (ref 0.40–4.50)

## 2018-06-27 NOTE — Telephone Encounter (Signed)
Covid-19 screening questions   Do you now or have you had a fever in the last 14 days? No.  Do you have any respiratory symptoms of shortness of breath or cough now or in the last 14 days? No.  Do you have any family members or close contacts with diagnosed or suspected Covid-19 in the past 14 days?No.  Have you been tested for Covid-19 and found to be positive? No.       Follow up Call-  Call back number 06/25/2018  Post procedure Call Back phone  # [phone]  Permission to leave phone message Yes  Some recent data might be hidden     Patient questions:  Do you have a fever, pain , or abdominal swelling? No. Pain Score  0 *  Have you tolerated food without any problems? Yes.    Have you been able to return to your normal activities? Yes.    Do you have any questions about your discharge instructions: Diet   No. Medications  No. Follow up visit  No.  Do you have questions or concerns about your Care? No.  Actions: * If pain score is 4 or above: No action needed, pain <4.

## 2018-06-30 ENCOUNTER — Encounter: Payer: Self-pay | Admitting: Internal Medicine

## 2018-06-30 ENCOUNTER — Other Ambulatory Visit: Payer: Self-pay | Admitting: Internal Medicine

## 2018-06-30 DIAGNOSIS — Z86010 Personal history of colonic polyps: Secondary | ICD-10-CM

## 2018-06-30 DIAGNOSIS — Z860101 Personal history of adenomatous and serrated colon polyps: Secondary | ICD-10-CM | POA: Insufficient documentation

## 2018-06-30 DIAGNOSIS — K2270 Barrett's esophagus without dysplasia: Secondary | ICD-10-CM

## 2018-06-30 HISTORY — DX: Personal history of adenomatous and serrated colon polyps: Z86.0101

## 2018-06-30 HISTORY — DX: Personal history of colonic polyps: Z86.010

## 2018-06-30 HISTORY — DX: Barrett's esophagus without dysplasia: K22.70

## 2018-06-30 MED ORDER — OMEPRAZOLE 20 MG PO CPDR: 20.0000 mg | DELAYED_RELEASE_CAPSULE | Freq: Every day | ORAL | 3 refills | Status: DC

## 2018-06-30 NOTE — Progress Notes (Signed)
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

## 2018-09-10 ENCOUNTER — Other Ambulatory Visit: Payer: Self-pay | Admitting: Internal Medicine

## 2018-09-10 DIAGNOSIS — G250 Essential tremor: Secondary | ICD-10-CM

## 2018-09-21 ENCOUNTER — Encounter: Payer: Self-pay | Admitting: Internal Medicine

## 2018-09-21 NOTE — Patient Instructions (Signed)

## 2018-09-21 NOTE — Progress Notes (Signed)
History of Present Illness:      This very nice 53 y.o. DWM presents for 3 month follow up with HTN, HLD, Pre-Diabetes and Vitamin D Deficiency. Patient has hx/o Gout controlled on his Allopurinol.       Patient is treated for HTN (2006) & BP has been controlled at home. Today's BP is at goal - 122/80. Patient has had no complaints of any cardiac type chest pain, palpitations, dyspnea / orthopnea / PND, dizziness, claudication, or dependent edema.      Hyperlipidemia is controlled with diet & meds. Patient denies myalgias or other med SE's. Last Lipids were at goal albeit elevated Trig's:  Lab Results  Component Value Date   CHOL 152 06/26/2018   HDL 50 06/26/2018   LDLCALC 76 06/26/2018   TRIG 164 (H) 06/26/2018   CHOLHDL 3.0 06/26/2018        Also, the patient is proactively monitored for glucose intolerance and has had no symptoms of reactive hypoglycemia, diabetic polys, paresthesias or visual blurring.  Last A1c was Normal & at goal: Lab Results  Component Value Date   HGBA1C 5.6 03/03/2018            Further, the patient also has history of Vitamin D Deficiency ("25" / 2008)  and supplements vitamin D without any suspected side-effects. Last vitamin D was at goal: Lab Results  Component Value Date   VD25OH 68 03/03/2018   Current Outpatient Medications on File Prior to Visit  Medication Sig  . acyclovir (ZOVIRAX) 800 MG tablet TAKE 1 TABLET DAILY FOR FEVER BLISTERS (Patient taking differently: TAKE 1 TABLET DAILY FOR FEVER BLISTERS PRN)  . allopurinol (ZYLOPRIM) 300 MG tablet TAKE 1 TABLET BY MOUTH EVERY DAY  . ALPRAZolam (XANAX) 1 MG tablet Take 1/2-1 tablet 2 - 3 x /day ONLY if needed for Anxiety Attack &  limit to 5 days /week to avoid addiction  . atorvastatin (LIPITOR) 80 MG tablet TAKE 1 TABLET BY MOUTH EVERY DAY AS DIRECTED FOR CHOLESTEROL  . Cholecalciferol (VITAMIN D3) 400 units CAPS Take 5,000 Units by mouth daily.   . hydrochlorothiazide (HYDRODIURIL) 25  MG tablet TAKE 1 TABLET BY MOUTH EVERY DAY FOR BLOOD PRESSURE AND FLUID  . ibuprofen (ADVIL,MOTRIN) 200 MG tablet Take by mouth as needed.   . loratadine-pseudoephedrine (CLARITIN-D 24 HOUR) 10-240 MG 24 hr tablet Take 1 tablet by mouth daily. (Patient taking differently: Take 1 tablet by mouth as needed. )  . omeprazole (PRILOSEC) 20 MG capsule Take 1 capsule (20 mg total) by mouth daily before breakfast.  . propranolol ER (INDERAL LA) 120 MG 24 hr capsule Take 1 capsule Daily for BP & Tremor  . OVER THE COUNTER MEDICATION 2 tablets as needed. OTC pepcid for indigestion    No current facility-administered medications on file prior to visit.    Allergies  Allergen Reactions  . Citalopram     dyphoria   PMHx:   Past Medical History:  Diagnosis Date  . Anxiety   . Barrett's esophagus 06/30/2018  . Encounter for long-term (current) use of other medications   . Hx of adenomatous colonic polyps 06/30/2018  . Hyperlipidemia   . Hypertension   . Hypogonadism male   . Vitamin D deficiency    Immunization History  Administered Date(s) Administered  . Influenza Inj Mdck Quad With Preservative 09/30/2017  . Influenza-Unspecified 10/07/2012  . PPD Test 01/12/2014, 08/01/2015, 09/10/2016  . Pneumococcal-Unspecified 02/11/2008  . Td 12/05/2005  .  Tdap 08/01/2015   Past Surgical History:  Procedure Laterality Date  . HERNIA REPAIR    . I&D EXTREMITY Right 06/19/2012   Procedure: IRRIGATION AND DEBRIDEMENT FOREARM;  Surgeon: Tennis Must, MD;  Location: [facility];  Service: Orthopedics;  Laterality: Right;  . KNEE ARTHROSCOPY WITH ANTERIOR CRUCIATE LIGAMENT (ACL) REPAIR Left Jan 2017   Dr. Ballard Russell  . LASIK  2006  . VASECTOMY      FHx:    Reviewed / unchanged  SHx:    Reviewed / unchanged   Systems Review:  Constitutional: Denies fever, chills, wt changes, headaches, insomnia, fatigue, night sweats, change in appetite. Eyes: Denies redness, blurred vision, diplopia, discharge, itchy,  watery eyes.  ENT: Denies discharge, congestion, post nasal drip, epistaxis, sore throat, earache, hearing loss, dental pain, tinnitus, vertigo, sinus pain, snoring.  CV: Denies chest pain, palpitations, irregular heartbeat, syncope, dyspnea, diaphoresis, orthopnea, PND, claudication or edema. Respiratory: denies cough, dyspnea, DOE, pleurisy, hoarseness, laryngitis, wheezing.  Gastrointestinal: Denies dysphagia, odynophagia, heartburn, reflux, water brash, abdominal pain or cramps, nausea, vomiting, bloating, diarrhea, constipation, hematemesis, melena, hematochezia  or hemorrhoids. Genitourinary: Denies dysuria, frequency, urgency, nocturia, hesitancy, discharge, hematuria or flank pain. Musculoskeletal: Denies arthralgias, myalgias, stiffness, jt. swelling, pain, limping or strain/sprain.  Skin: Denies pruritus, rash, hives, warts, acne, eczema or change in skin lesion(s). Neuro: No weakness, tremor, incoordination, spasms, paresthesia or pain. Psychiatric: Denies confusion, memory loss or sensory loss. Endo: Denies change in weight, skin or hair change.  Heme/Lymph: No excessive bleeding, bruising or enlarged lymph nodes.  Physical Exam  BP 122/80   Pulse 78   Temp (!) 97.5 F (36.4 C)   Ht 5\' 11"  (1.803 m)   Wt 204 lb (92.5 kg)   SpO2 98%   BMI 28.45 kg/m   Appears  well nourished, well groomed  and in no distress.  Eyes: PERRLA, EOMs, conjunctiva no swelling or erythema. Sinuses: No frontal/maxillary tenderness ENT/Mouth: EAC's clear, TM's nl w/o erythema, bulging. Nares clear w/o erythema, swelling, exudates. Oropharynx clear without erythema or exudates. Oral hygiene is good. Tongue normal, non obstructing. Hearing intact.  Neck: Supple. Thyroid not palpable. Car 2+/2+ without bruits, nodes or JVD. Chest: Respirations nl with BS clear & equal w/o rales, rhonchi, wheezing or stridor.  Cor: Heart sounds normal w/ regular rate and rhythm without sig. murmurs, gallops, clicks or  rubs. Peripheral pulses normal and equal  without edema.  Abdomen: Soft & bowel sounds normal. Non-tender w/o guarding, rebound, hernias, masses or organomegaly.  Lymphatics: Unremarkable.  Musculoskeletal: Full ROM all peripheral extremities, joint stability, 5/5 strength and normal gait.  Skin: Warm, dry without exposed rashes, lesions or ecchymosis apparent.  Neuro: Cranial nerves intact, reflexes equal bilaterally. Sensory-motor testing grossly intact. Tendon reflexes grossly intact. Mild hi frequency low amplitude tremor of Lt>Rt fingers. Pysch: Alert & oriented x 3.  Insight and judgement nl & appropriate. No ideations.  Assessment and Plan:  1. Essential hypertension  - Continue medication, monitor blood pressure at home.  - Continue DASH diet.  Reminder to go to the ER if any CP,  SOB, nausea, dizziness, severe HA, changes vision/speech.  - CBC with Differential/Platelet - COMPLETE METABOLIC PANEL WITH GFR - Magnesium - TSH  2. Hyperlipidemia, mixed  - Continue diet/meds, exercise,& lifestyle modifications.  - Continue monitor periodic cholesterol/liver & renal functions   - Lipid panel - TSH  3. Abnormal glucose  - Continue diet, exercise  - Lifestyle modifications.  - Monitor appropriate labs.  - Hemoglobin  A1c - Insulin, random  4. Vitamin D deficiency  - Continue supplementation.  - VITAMIN D 25 Hydroxyl  5. Gout  - Uric acid  6. Medication management  - CBC with Differential/Platelet - COMPLETE METABOLIC PANEL WITH GFR - Magnesium - Lipid panel - TSH - Hemoglobin A1c - Insulin, random - VITAMIN D 25 Hydroxyl - Uric acid  7. Hereditary essential tremor  - diazepam (VALIUM) 5 MG tablet; Take 1/2 to 1 tablet 3 x /day for tremor  Dispense: 90 tablet; Refill: 3       Discussed  regular exercise, BP monitoring, weight control to achieve/maintain BMI less than 25 and discussed med and SE's. Recommended labs to assess and monitor clinical status  with further disposition pending results of labs. Patient indicated willingness to try Depade. I discussed the assessment and treatment plan with the patient. The patient was provided an opportunity to ask questions and all were answered. The patient agreed with the plan and demonstrated an understanding of the instructions.  I provided over 30 minutes of exam, counseling, chart review and  complex critical decision making.  Kirtland Bouchard, MD

## 2018-09-22 ENCOUNTER — Ambulatory Visit: Payer: 59 | Admitting: Internal Medicine

## 2018-09-22 ENCOUNTER — Encounter: Payer: Self-pay | Admitting: Internal Medicine

## 2018-09-22 ENCOUNTER — Other Ambulatory Visit: Payer: Self-pay

## 2018-09-22 VITALS — BP 122/80 | HR 78 | Temp 97.5°F | Ht 71.0 in | Wt 204.0 lb

## 2018-09-22 DIAGNOSIS — I10 Essential (primary) hypertension: Secondary | ICD-10-CM | POA: Diagnosis not present

## 2018-09-22 DIAGNOSIS — R7309 Other abnormal glucose: Secondary | ICD-10-CM

## 2018-09-22 DIAGNOSIS — E559 Vitamin D deficiency, unspecified: Secondary | ICD-10-CM | POA: Diagnosis not present

## 2018-09-22 DIAGNOSIS — G250 Essential tremor: Secondary | ICD-10-CM

## 2018-09-22 DIAGNOSIS — E782 Mixed hyperlipidemia: Secondary | ICD-10-CM | POA: Diagnosis not present

## 2018-09-22 DIAGNOSIS — M109 Gout, unspecified: Secondary | ICD-10-CM

## 2018-09-22 DIAGNOSIS — Z79899 Other long term (current) drug therapy: Secondary | ICD-10-CM

## 2018-09-22 MED ORDER — DIAZEPAM 5 MG PO TABS: ORAL_TABLET | ORAL | 3 refills | Status: DC

## 2018-09-22 MED ORDER — NALTREXONE HCL 50 MG PO TABS: ORAL_TABLET | ORAL | 3 refills | Status: DC

## 2018-09-23 LAB — MAGNESIUM: Magnesium: 1.5 mg/dL (ref 1.5–2.5)

## 2018-09-23 LAB — CBC WITH DIFFERENTIAL/PLATELET
Absolute Monocytes: 589 cells/uL (ref 200–950)
Basophils Absolute: 39 cells/uL (ref 0–200)
Basophils Relative: 0.7 %
Eosinophils Absolute: 160 cells/uL (ref 15–500)
Eosinophils Relative: 2.9 %
HCT: 43.6 % (ref 38.5–50.0)
Hemoglobin: 15.6 g/dL (ref 13.2–17.1)
Lymphs Abs: 1001 cells/uL (ref 850–3900)
MCH: 34.9 pg — ABNORMAL HIGH (ref 27.0–33.0)
MCHC: 35.8 g/dL (ref 32.0–36.0)
MCV: 97.5 fL (ref 80.0–100.0)
MPV: 10.3 fL (ref 7.5–12.5)
Monocytes Relative: 10.7 %
Neutro Abs: 3713 cells/uL (ref 1500–7800)
Neutrophils Relative %: 67.5 %
Platelets: 147 10*3/uL (ref 140–400)
RBC: 4.47 10*6/uL (ref 4.20–5.80)
RDW: 12.7 % (ref 11.0–15.0)
Total Lymphocyte: 18.2 %
WBC: 5.5 10*3/uL (ref 3.8–10.8)

## 2018-09-23 LAB — TSH: TSH: 1.73 mIU/L (ref 0.40–4.50)

## 2018-09-23 LAB — COMPLETE METABOLIC PANEL WITH GFR
AG Ratio: 1.4 (calc) (ref 1.0–2.5)
ALT: 111 U/L — ABNORMAL HIGH (ref 9–46)
AST: 135 U/L — ABNORMAL HIGH (ref 10–35)
Albumin: 4.1 g/dL (ref 3.6–5.1)
Alkaline phosphatase (APISO): 63 U/L (ref 35–144)
BUN: 12 mg/dL (ref 7–25)
CO2: 29 mmol/L (ref 20–32)
Calcium: 9.7 mg/dL (ref 8.6–10.3)
Chloride: 97 mmol/L — ABNORMAL LOW (ref 98–110)
Creat: 0.95 mg/dL (ref 0.70–1.33)
GFR, Est African American: 108 mL/min/{1.73_m2} (ref >=60)
GFR, Est Non African American: 93 mL/min/{1.73_m2} (ref >=60)
Globulin: 2.9 g/dL (calc) (ref 1.9–3.7)
Glucose, Bld: 117 mg/dL — ABNORMAL HIGH (ref 65–99)
Potassium: 3.6 mmol/L (ref 3.5–5.3)
Sodium: 141 mmol/L (ref 135–146)
Total Bilirubin: 0.8 mg/dL (ref 0.2–1.2)
Total Protein: 7 g/dL (ref 6.1–8.1)

## 2018-09-23 LAB — VITAMIN D 25 HYDROXY (VIT D DEFICIENCY, FRACTURES): Vit D, 25-Hydroxy: 90 ng/mL (ref 30–100)

## 2018-09-23 LAB — LIPID PANEL
Cholesterol: 175 mg/dL (ref <200)
HDL: 65 mg/dL (ref >=40)
LDL Cholesterol (Calc): 88 mg/dL (calc)
Non-HDL Cholesterol (Calc): 110 mg/dL (calc) (ref <130)
Total CHOL/HDL Ratio: 2.7 (calc) (ref <5.0)
Triglycerides: 127 mg/dL (ref <150)

## 2018-09-23 LAB — HEMOGLOBIN A1C
Hgb A1c MFr Bld: 5.4 % of total Hgb (ref <5.7)
Mean Plasma Glucose: 108 (calc)
eAG (mmol/L): 6 (calc)

## 2018-09-23 LAB — INSULIN, RANDOM: Insulin: 13.8 u[IU]/mL

## 2018-09-23 LAB — URIC ACID: Uric Acid, Serum: 4.9 mg/dL (ref 4.0–8.0)

## 2018-10-19 ENCOUNTER — Other Ambulatory Visit: Payer: Self-pay | Admitting: Internal Medicine

## 2018-12-18 ENCOUNTER — Other Ambulatory Visit: Payer: Self-pay | Admitting: Internal Medicine

## 2018-12-18 DIAGNOSIS — M1A9XX0 Chronic gout, unspecified, without tophus (tophi): Secondary | ICD-10-CM

## 2018-12-22 NOTE — Progress Notes (Signed)
3 MONTH FOLLOW UP  Assessment and Plan:   Hypertension Borderline Patient unsure of medications, HCTZ 25 propanolol (remors) enalapril 20mg ?  Patient to send message with current medications. Monitor blood pressure at home; patient to call if consistently greater than 140/90 Continue DASH diet.   Reminder to go to the ER if any CP, SOB, nausea, dizziness, severe HA, changes vision/speech, left arm numbness and tingling and jaw pain.  Cholesterol Currently at goal; continue atorvastatin 80 mg daily Continue low cholesterol diet and exercise.  Check lipid panel.   Other abnormal glucose Recent A1Cs at goal Discussed diet/exercise, weight management   GERD Doing well at this time Continue: omeprazole, follows with Dr Leroy Kennedy Diet discussed Monitor for triggers Avoid food with high acid content Avoid excessive cafeine Increase water intake  Overweight Long discussion about weight loss, diet, and exercise Recommended diet heavy in fruits and veggies and low in animal meats, cheeses, and dairy products, appropriate calorie intake Discussed ideal weight for height Will follow up in 3 months  Vitamin D Def Continue supplementation Taking Vitamin D 5,000 IU daily  Anxiety Well managed by current regimen; he reports daytime stress is fairly managed and wants to avoid a daily medication. continue PRN medications Stress management techniques discussed, increase water, good sleep hygiene discussed, increase exercise, and increase veggies.   Gout Continue allopurinol daily Diet discussed       Check uric acid Increase water intake  Fatty liver Weight loss advised, avoid alcohol/tylenol, will monitor LFTs Discussed ETOH use at length  ETOH abuse Discussed reducing ETOH intake as well as increasing water intake Concern for worsening LFT's related to increase in intake since last visit. Discussed other method of stress or anxiety relief as well other way to fill  time. Discussed risks of hospitalization as behavior is not sustainable by evidence of liver and kidney function.  Abdominal pains, right upper quadrant Asymptomatic today Discussed poor diet and ETOH likely contributing to this Discussed dietary changes, increase water intake, modify diet, printed education provided. Discussed hospital precautions with patient.  Medication Management continued  Persistent Proteinuria Had appointment with nephrology 7/21 Poor historian Have office note but no further plans s/p labs? Taking enalapril 20mg ? Follow up with Nephrology?  Continue diet and meds as discussed. Further disposition pending results of labs. Discussed med's effects and SE's.   Over 30 minutes of exam, counseling, chart review, and critical decision making was performed.   Future Appointments  Date Time Provider [facility]  03/23/2019  9:00 AM Unk Pinto, MD [facility] None    ----------------------------------------------------------------------------------------------------------------------  HPI 53 y.o. male  presents for 3 month follow up on HTN, HLD, prediabetes, GERD, anxiety, weight and vitamin D deficiency.   He has a diagnosis of anxiety and on xanax 1 mg TID PRN - he currently typically takes 1 tab at night for sleep after stressful day at work, uses very rarely during the day - previously treated by [place] with severe SE, reports symptoms are well controlled on current regimen.  Hasn't done well trying to switch agents, declines alternative daily agent at this time.   He has a diagnosis of GERD which is currently managed by PRN omeprazole 40 mg daily.  He tried pepsid and has an increase in reflux symptoms. He reports symptoms is currently well controlled, and denies breakthrough reflux, burning in chest, hoarReports that is it random when it occurs. seness or cough.    Couple days ago had some severe abdominal pain that was constant  pain that is below  his belly button.  He reports applying pressure tat got worse when being pressed on.  LBM one day ago, no change in consistancy or pattern.  He is also getting intermittent nausea that causes dry heaving at least once a week or every two weeks.  He did not see any treatment for this. We discussed alcohol use and he reports drinking 10 alcoholic beverages a day.  He reports that it is typically bourbon.  He is retired and lives at [employer].  Reports that this does not interfere with his daily activities or responsibilities.  Reports he drink socially out at bars or restaurants.  Denies drinking alone or when waking, denies any SI/HI  Denies any depression.  He was given a prescription for Naltrexone but only took a couple doses of this as he reports it made him feel bad.     BMI is Body mass index is 28.73 kg/m., he has been working on diet but admits less exercise due to gyms closing.   Wt Readings from Last 3 Encounters:  12/23/18 206 lb (93.4 kg)  09/22/18 204 lb (92.5 kg)  06/26/18 200 lb 12.8 oz (91.1 kg)   His blood pressure is BP: 140/90 and he does not check his blood pressure at home.  He is taking HCTZ 25mg  daily for lower extremity edema and propanolol, which also helps with tremors.   He does not workout. He denies chest pain, shortness of breath, dizziness. Reports he does not drink water regularly.  There are some days where he does not drink any water.   He is on cholesterol medication (atorvastatin 40 mg daily) and denies myalgias. His cholesterol is not at goal. The cholesterol last visit was:   Lab Results  Component Value Date   CHOL 175 09/22/2018   HDL 65 09/22/2018   LDLCALC 88 09/22/2018   TRIG 127 09/22/2018   CHOLHDL 2.7 09/22/2018    He has not been working on diet and exercise for glucose management, and denies increased appetite, nausea, paresthesia of the feet, polydipsia, polyuria, visual disturbances and vomiting. Last A1C in the office was:  Lab Results   Component Value Date   HGBA1C 5.4 12/23/2018   Patient is on Vitamin D supplement and at goal at recent check:    Lab Results  Component Value Date   VD25OH 90 09/22/2018     Patient is on allopurinol for gout.  Denies any recent flares.  Last uric acid was: Lab Results  Component Value Date   LABURIC 4.9 09/22/2018    Current Medications:  Current Outpatient Medications on File Prior to Visit  Medication Sig  . acyclovir (ZOVIRAX) 800 MG tablet TAKE 1 TABLET DAILY FOR FEVER BLISTERS (Patient taking differently: TAKE 1 TABLET DAILY FOR FEVER BLISTERS PRN)  . allopurinol (ZYLOPRIM) 300 MG tablet Take 1 tablet Daily to Prevent Gout  . atorvastatin (LIPITOR) 80 MG tablet Take 1 tablet Daily for Cholesterol  . Cholecalciferol (VITAMIN D3) 400 units CAPS Take 5,000 Units by mouth daily.   . diazepam (VALIUM) 5 MG tablet Take 1/2 to 1 tablet 3 x /day for tremor  . hydrochlorothiazide (HYDRODIURIL) 25 MG tablet Take 1 tablet Daily for BP & Fluid Retention / Ankle Swelling  . ibuprofen (ADVIL,MOTRIN) 200 MG tablet Take by mouth as needed.   . loratadine-pseudoephedrine (CLARITIN-D 24 HOUR) 10-240 MG 24 hr tablet Take 1 tablet by mouth daily. (Patient taking differently: Take 1 tablet by mouth as needed. )  .  omeprazole (PRILOSEC) 20 MG capsule Take 1 capsule (20 mg total) by mouth daily before breakfast.  . OVER THE COUNTER MEDICATION 2 tablets as needed. OTC pepcid for indigestion   . propranolol ER (INDERAL LA) 120 MG 24 hr capsule Take 1 capsule Daily for BP & Tremor   No current facility-administered medications on file prior to visit.     Allergies:  Allergies  Allergen Reactions  . Citalopram     dyphoria     Medical History:  Past Medical History:  Diagnosis Date  . Anxiety   . Barrett's esophagus 06/30/2018  . Encounter for long-term (current) use of other medications   . Hx of adenomatous colonic polyps 06/30/2018  . Hyperlipidemia   . Hypertension   . Hypogonadism  male   . Vitamin D deficiency    Family history- Reviewed and unchanged Social history- Reviewed and unchanged   Review of Systems:  Review of Systems  Constitutional: Negative for malaise/fatigue and weight loss.  HENT: Negative for hearing loss and tinnitus.   Eyes: Negative for blurred vision and double vision.  Respiratory: Negative for cough, shortness of breath and wheezing.   Cardiovascular: Negative for chest pain, palpitations, orthopnea, claudication and leg swelling.  Gastrointestinal: Negative for abdominal pain, blood in stool, constipation, diarrhea, heartburn, melena, nausea and vomiting.  Genitourinary: Negative.   Musculoskeletal: Negative for joint pain and myalgias.  Skin: Negative for rash.  Neurological: Negative for dizziness, tingling, sensory change, weakness and headaches.  Endo/Heme/Allergies: Negative for polydipsia.  Psychiatric/Behavioral: Negative.   All other systems reviewed and are negative.   Physical Exam: BP 140/90   Pulse 93   Temp 97.7 F (36.5 C)   Wt 206 lb (93.4 kg)   SpO2 95%   BMI 28.73 kg/m  Wt Readings from Last 3 Encounters:  12/23/18 206 lb (93.4 kg)  09/22/18 204 lb (92.5 kg)  06/26/18 200 lb 12.8 oz (91.1 kg)   General Appearance: Well nourished, in no apparent distress. Eyes: PERRLA, EOMs, conjunctiva no swelling or erythema Sinuses: No Frontal/maxillary tenderness ENT/Mouth: Ext aud canals clear, TMs without erythema, bulging. Hearing normal.  Neck: Supple, thyroid normal.  Respiratory: Respiratory effort normal, BS equal bilaterally without rales, rhonchi, wheezing or stridor.  Cardio: RRR with no MRGs. Brisk peripheral pulses without edema.  Abdomen: Distended but Soft, + BS.  Non tender, no guarding, rebound, hernias, masses. Lymphatics: Non tender without lymphadenopathy.  Musculoskeletal: Full ROM, 5/5 strength, Normal gait Skin: Warm, dry without rashes, lesions, ecchymosis. Skin tugor, tenting noted bilateral  forearms. Neuro: Cranial nerves intact. No cerebellar symptoms.  Psych: Awake and oriented X 3, normal affect, Insight and Judgment appropriate.    Garnet Sierras, NP 10:20 AM [facility]

## 2018-12-23 ENCOUNTER — Ambulatory Visit: Payer: 59 | Admitting: Adult Health Nurse Practitioner

## 2018-12-23 ENCOUNTER — Other Ambulatory Visit: Payer: Self-pay

## 2018-12-23 ENCOUNTER — Encounter: Payer: Self-pay | Admitting: Adult Health Nurse Practitioner

## 2018-12-23 VITALS — BP 140/90 | HR 93 | Temp 97.7°F | Wt 206.0 lb

## 2018-12-23 DIAGNOSIS — R1011 Right upper quadrant pain: Secondary | ICD-10-CM

## 2018-12-23 DIAGNOSIS — R7309 Other abnormal glucose: Secondary | ICD-10-CM

## 2018-12-23 DIAGNOSIS — R801 Persistent proteinuria, unspecified: Secondary | ICD-10-CM

## 2018-12-23 DIAGNOSIS — F1010 Alcohol abuse, uncomplicated: Secondary | ICD-10-CM

## 2018-12-23 DIAGNOSIS — Z79899 Other long term (current) drug therapy: Secondary | ICD-10-CM

## 2018-12-23 DIAGNOSIS — M109 Gout, unspecified: Secondary | ICD-10-CM

## 2018-12-23 DIAGNOSIS — I10 Essential (primary) hypertension: Secondary | ICD-10-CM | POA: Diagnosis not present

## 2018-12-23 DIAGNOSIS — E782 Mixed hyperlipidemia: Secondary | ICD-10-CM

## 2018-12-23 DIAGNOSIS — F419 Anxiety disorder, unspecified: Secondary | ICD-10-CM

## 2018-12-23 DIAGNOSIS — E663 Overweight: Secondary | ICD-10-CM

## 2018-12-23 DIAGNOSIS — E559 Vitamin D deficiency, unspecified: Secondary | ICD-10-CM

## 2018-12-23 DIAGNOSIS — K219 Gastro-esophageal reflux disease without esophagitis: Secondary | ICD-10-CM | POA: Diagnosis not present

## 2018-12-23 DIAGNOSIS — K760 Fatty (change of) liver, not elsewhere classified: Secondary | ICD-10-CM

## 2018-12-23 NOTE — Patient Instructions (Signed)
DRINK MORE WATER!  Goal is to drink 64-80oz of water daily.  This helps you kidneys to filter out toxins from your body.  Please send me a message or snap of picture of your medications and doses when you get back home.  Should you have prolonged chest pains it is important to seek immediate attention!     Monitor your abdominal pains.  Be sure you are eating vegtables and avoiding greasy/fried foods.  We will respond in 1-3 days with your labs results via My Chart.  You can get Influenza vaccination and shingrix at your local pharmacy.  Ask insurance and pharmacy about shingrix - new vaccine   Can go to [email or web address] for more information  Shingrix Vaccination  Two vaccines are licensed and recommended to prevent shingles in the [place] Zoster vaccine live (ZVL, Zostavax) has been in use since 2006. Recombinant zoster vaccine (RZV, Shingrix), has been in use since 2017 and is recommended by AC[employer] as the preferred shingles vaccine.  What Everyone Should Know about Shingles Vaccine (Shingrix) One of the Recommended Vaccines by Disease Shingles vaccination is the only way to protect against shingles and postherpetic neuralgia (PHN), the most common complication from shingles. CDC recommends that healthy adults 50 years and older get two doses of the shingles vaccine called Shingrix (recombinant zoster vaccine), separated by 2 to 6 months, to prevent shingles and the complications from the disease. Your doctor or pharmacist can give you Shingrix as a shot in your upper arm. Shingrix provides strong protection against shingles and PHN. Two doses of Shingrix is more than 90% effective at preventing shingles and PHN. Protection stays above 85% for at least the first four years after you get vaccinated. Shingrix is the preferred vaccine, over Zostavax (zoster vaccine live), a shingles vaccine in use since 2006. Zostavax may still be used to  prevent shingles in healthy adults 60 years and older. For example, you could use Zostavax if a person is allergic to Shingrix, prefers Zostavax, or requests immediate vaccination and Shingrix is unavailable. Who Should Get Shingrix? Healthy adults 50 years and older should get two doses of Shingrix, separated by 2 to 6 months. You should get Shingrix even if in the past you . had shingles  . received Zostavax  . are not sure if you had chickenpox There is no maximum age for getting Shingrix. If you had shingles in the past, you can get Shingrix to help prevent future occurrences of the disease. There is no specific length of time that you need to wait after having shingles before you can receive Shingrix, but generally you should make sure the shingles rash has gone away before getting vaccinated. You can get Shingrix whether or not you remember having had chickenpox in the past. Studies show that more than 99% of Americans 40 years and older have had chickenpox, even if they don't remember having the disease. Chickenpox and shingles are related because they are caused by the same virus (varicella zoster virus). After a person recovers from chickenpox, the virus stays dormant (inactive) in the body. It can reactivate years later and cause shingles. If you had Zostavax in the recent past, you should wait at least eight weeks before getting Shingrix. Talk to your healthcare provider to determine the best time to get Shingrix. Shingrix is available in [employer] and pharmacies. To find doctor's offices or pharmacies near you that offer the vaccine, visit [facility] Vaccine FinderExternal. If you have questions about Shingrix, talk with your  healthcare provider. Vaccine for Those 16 Years and Older  Shingrix reduces the risk of shingles and PHN by more than 90% in people 66 and older. CDC recommends the vaccine for healthy adults 12 and older.  Who Should Not Get Shingrix? You should not get  Shingrix if you: . have ever had a severe allergic reaction to any component of the vaccine or after a dose of Shingrix  . tested negative for immunity to varicella zoster virus. If you test negative, you should get chickenpox vaccine.  . currently have shingles  . currently are pregnant or breastfeeding. Women who are pregnant or breastfeeding should wait to get Shingrix.  Marland Kitchen receive specific antiviral drugs (acyclovir, famciclovir, or valacyclovir) 24 hours before vaccination (avoid use of these antiviral drugs for 14 days after vaccination)- zoster vaccine live only If you have a minor acute (starts suddenly) illness, such as a cold, you may get Shingrix. But if you have a moderate or severe acute illness, you should usually wait until you recover before getting the vaccine. This includes anyone with a temperature of 101.12F or higher. The side effects of the Shingrix are temporary, and usually last 2 to 3 days. While you may experience pain for a few days after getting Shingrix, the pain will be less severe than having shingles and the complications from the disease. How Well Does Shingrix Work? Two doses of Shingrix provides strong protection against shingles and postherpetic neuralgia (PHN), the most common complication of shingles. . In adults 26 to 51 years old who got two doses, Shingrix was 97% effective in preventing shingles; among adults 70 years and older, Shingrix was 91% effective.  . In adults 63 to 51 years old who got two doses, Shingrix was 91% effective in preventing PHN; among adults 70 years and older, Shingrix was 89% effective. Shingrix protection remained high (more than 85%) in people 70 years and older throughout the four years following vaccination. Since your risk of shingles and PHN increases as you get older, it is important to have strong protection against shingles in your older years. Top of Page  What Are the Possible Side Effects of Shingrix? Studies show that  Shingrix is safe. The vaccine helps your body create a strong defense against shingles. As a result, you are likely to have temporary side effects from getting the shots. The side effects may affect your ability to do normal daily activities for 2 to 3 days. Most people got a sore arm with mild or moderate pain after getting Shingrix, and some also had redness and swelling where they got the shot. Some people felt tired, had muscle pain, a headache, shivering, fever, stomach pain, or nausea. About 1 out of 6 people who got Shingrix experienced side effects that prevented them from doing regular activities. Symptoms went away on their own in about 2 to 3 days. Side effects were more common in younger people. You might have a reaction to the first or second dose of Shingrix, or both doses. If you experience side effects, you may choose to take over-the-counter pain medicine such as ibuprofen or acetaminophen. If you experience side effects from Shingrix, you should report them to the Vaccine Adverse Event Reporting System (VAERS). Your doctor might file this report, or you can do it yourself through the VAERS websiteExternal, or by calling [phone]. If you have any questions about side effects from Shingrix, talk with your doctor. The shingles vaccine does not contain thimerosal (a preservative containing mercury). Top  of Page  When Should I See a Doctor Because of the Side Effects I Experience From Shingrix? In clinical trials, Shingrix was not associated with serious adverse events. In fact, serious side effects from vaccines are extremely rare. For example, for every 1 million doses of a vaccine given, only one or two people may have a severe allergic reaction. Signs of an allergic reaction happen within minutes or hours after vaccination and include hives, swelling of the face and throat, difficulty breathing, a fast heartbeat, dizziness, or weakness. If you experience these or any other  life-threatening symptoms, see a doctor right away. Shingrix causes a strong response in your immune system, so it may produce short-term side effects more intense than you are used to from other vaccines. These side effects can be uncomfortable, but they are expected and usually go away on their own in 2 or 3 days. Top of Page  How Can I Pay For Shingrix? There are several ways shingles vaccine may be paid for: Medicare . Medicare Part D plans cover the shingles vaccine, but there may be a cost to you depending on your plan. There may be a copay for the vaccine, or you may need to pay in full then get reimbursed for a certain amount.  . Medicare Part B does not cover the shingles vaccine. Medicaid . Medicaid may or may not cover the vaccine. Contact your insurer to find out. Private health insurance . Many private health insurance plans will cover the vaccine, but there may be a cost to you depending on your plan. Contact your insurer to find out. Vaccine assistance programs . Some pharmaceutical companies provide vaccines to eligible adults who cannot afford them. You may want to check with the vaccine manufacturer, [employer], about Shingrix. If you do not currently have health insurance, learn more about affordable health coverage optionsExternal. To find doctor's offices or pharmacies near you that offer the vaccine, visit HealthMap Vaccine FinderExternal.    Vit D  & Vit C 1,000 mg   are recommended to help protect  against the Covid-19 and other Corona viruses.    Also it's recommended  to take  Zinc 50 mg  to help  protect against the Covid-19   and best place to get  is also on [employer]  and don't pay more than 6-8 cents /pill !  =============================== Coronavirus (COVID-19) Are you at risk?  Are you at risk for the Coronavirus (COVID-19)?  To be considered HIGH RISK for Coronavirus (COVID-19), you have to meet the following criteria:  . Traveled to  [place], [place], [place], [place] or [place]; or in the [place] to [place], [place], [place]  . or [place]; and have fever, cough, and shortness of breath within the last 2 weeks of travel OR . Been in close contact with a person diagnosed with COVID-19 within the last 2 weeks and have  . fever, cough,and shortness of breath .  . IF YOU DO NOT MEET THESE CRITERIA, YOU ARE CONSIDERED LOW RISK FOR COVID-19.  What to do if you are HIGH RISK for COVID-19?  Marland Kitchen If you are having a medical emergency, call 911. . Seek medical care right away. Before you go to a doctor's office, urgent care or emergency department, .  call ahead and tell them about your recent travel, contact with someone diagnosed with COVID-19  .  and your symptoms.  . You should receive instructions from your physician's office regarding next steps of  care.  . When you arrive at healthcare provider, tell the healthcare staff immediately you have returned from  . visiting [place], [place], [place], [place] or [place]; or traveled in the [place] to [place], [place],  . [place] or [place] in the last two weeks or you have been in close contact with a person diagnosed with  . COVID-19 in the last 2 weeks.   . Tell the health care staff about your symptoms: fever, cough and shortness of breath. . After you have been seen by a medical provider, you will be either: o Tested for (COVID-19) and discharged home on quarantine except to seek medical care if  o symptoms worsen, and asked to  - Stay home and avoid contact with others until you get your results (4-5 days)  - Avoid travel on public transportation if possible (such as bus, train, or airplane) or o Sent to the Emergency Department by EMS for evaluation, COVID-19 testing  and  o possible admission depending on your condition and test results.  What to do if you are LOW RISK for COVID-19?  Reduce your risk of any infection by using the same precautions  used for avoiding the common cold or flu:  Marland Kitchen Wash your hands often with soap and warm water for at least 20 seconds.  If soap and water are not readily available,  . use an alcohol-based hand sanitizer with at least 60% alcohol.  . If coughing or sneezing, cover your mouth and nose by coughing or sneezing into the elbow areas of your shirt or coat, .  into a tissue or into your sleeve (not your hands). . Avoid shaking hands with others and consider head nods or verbal greetings only. . Avoid touching your eyes, nose, or mouth with unwashed hands.  . Avoid close contact with people who are sick. . Avoid places or events with large numbers of people in one location, like concerts or sporting events. . Carefully consider travel plans you have or are making. . If you are planning any travel outside or inside the [place], visit the CDC's Travelers' Health webpage for the latest health notices. . If you have some symptoms but not all symptoms, continue to monitor at home and seek medical attention  . if your symptoms worsen. . If you are having a medical emergency, call 911. >>>>>>>>>>>>>>>>>>>>>>>>>>>> Preventive Care for Adults  A healthy lifestyle and preventive care can promote health and wellness. Preventive health guidelines for men include the following key practices:  A routine yearly physical is a good way to check with your health care provider about your health and preventative screening. It is a chance to share any concerns and updates on your health and to receive a thorough exam.  Visit your dentist for a routine exam and preventative care every 6 months. Brush your teeth twice a day and floss once a day. Good oral hygiene prevents tooth decay and gum disease.  The frequency of eye exams is based on your age, health, family medical history, use of contact lenses, and other factors. Follow your health care provider's recommendations for frequency of eye exams.  Eat a healthy diet. Foods  such as vegetables, fruits, whole grains, low-fat dairy products, and lean protein foods contain the nutrients you need without too many calories. Decrease your intake of foods high in solid fats, added sugars, and salt. Eat the right amount of calories for you. Get information about a proper diet from your health  care provider, if necessary.  Regular physical exercise is one of the most important things you can do for your health. Most adults should get at least 150 minutes of moderate-intensity exercise (any activity that increases your heart rate and causes you to sweat) each week. In addition, most adults need muscle-strengthening exercises on 2 or more days a week.  Maintain a healthy weight. The body mass index (BMI) is a screening tool to identify possible weight problems. It provides an estimate of body fat based on height and weight. Your health care provider can find your BMI and can help you achieve or maintain a healthy weight. For adults 20 years and older:  A BMI below 18.5 is considered underweight.  A BMI of 18.5 to 24.9 is normal.  A BMI of 25 to 29.9 is considered overweight.  A BMI of 30 and above is considered obese.  Maintain normal blood lipids and cholesterol levels by exercising and minimizing your intake of saturated fat. Eat a balanced diet with plenty of fruit and vegetables. Blood tests for lipids and cholesterol should begin at age 21 and be repeated every 5 years. If your lipid or cholesterol levels are high, you are over 50, or you are at high risk for heart disease, you may need your cholesterol levels checked more frequently. Ongoing high lipid and cholesterol levels should be treated with medicines if diet and exercise are not working.  If you smoke, find out from your health care provider how to quit. If you do not use tobacco, do not start.  Lung cancer screening is recommended for adults aged 7-80 years who are at high risk for developing lung cancer because of  a history of smoking. A yearly low-dose CT scan of the lungs is recommended for people who have at least a 30-pack-year history of smoking and are a current smoker or have quit within the past 15 years. A pack year of smoking is smoking an average of 1 pack of cigarettes a day for 1 year (for example: 1 pack a day for 30 years or 2 packs a day for 15 years). Yearly screening should continue until the smoker has stopped smoking for at least 15 years. Yearly screening should be stopped for people who develop a health problem that would prevent them from having lung cancer treatment.  If you choose to drink alcohol, do not have more than 2 drinks per day. One drink is considered to be 12 ounces (355 mL) of beer, 5 ounces (148 mL) of wine, or 1.5 ounces (44 mL) of liquor.  Avoid use of street drugs. Do not share needles with anyone. Ask for help if you need support or instructions about stopping the use of drugs.  High blood pressure causes heart disease and increases the risk of stroke. Your blood pressure should be checked at least every 1-2 years. Ongoing high blood pressure should be treated with medicines, if weight loss and exercise are not effective.  If you are 13-82 years old, ask your health care provider if you should take aspirin to prevent heart disease.  Diabetes screening involves taking a blood sample to check your fasting blood sugar level. This should be done once every 3 years, after age 21, if you are within normal weight and without risk factors for diabetes. Testing should be considered at a younger age or be carried out more frequently if you are overweight and have at least 1 risk factor for diabetes.  Colorectal cancer can be  detected and often prevented. Most routine colorectal cancer screening begins at the age of 46 and continues through age 57. However, your health care provider may recommend screening at an earlier age if you have risk factors for colon cancer. On a yearly  basis, your health care provider may provide home test kits to check for hidden blood in the stool. Use of a small camera at the end of a tube to directly examine the colon (sigmoidoscopy or colonoscopy) can detect the earliest forms of colorectal cancer. Talk to your health care provider about this at age 69, when routine screening begins. Direct exam of the colon should be repeated every 5-10 years through age 60, unless early forms of precancerous polyps or small growths are found.   Talk with your health care provider about prostate cancer screening.  Testicular cancer screening isrecommended for adult males. Screening includes self-exam, a health care provider exam, and other screening tests. Consult with your health care provider about any symptoms you have or any concerns you have about testicular cancer.  Use sunscreen. Apply sunscreen liberally and repeatedly throughout the day. You should seek shade when your shadow is shorter than you. Protect yourself by wearing long sleeves, pants, a wide-brimmed hat, and sunglasses year round, whenever you are outdoors.  Once a month, do a whole-body skin exam, using a mirror to look at the skin on your back. Tell your health care provider about new moles, moles that have irregular borders, moles that are larger than a pencil eraser, or moles that have changed in shape or color.  Stay current with required vaccines (immunizations).  Influenza vaccine. All adults should be immunized every year.  Tetanus, diphtheria, and acellular pertussis (Td, Tdap) vaccine. An adult who has not previously received Tdap or who does not know his vaccine status should receive 1 dose of Tdap. This initial dose should be followed by tetanus and diphtheria toxoids (Td) booster doses every 10 years. Adults with an unknown or incomplete history of completing a 3-dose immunization series with Td-containing vaccines should begin or complete a primary immunization series including  a Tdap dose. Adults should receive a Td booster every 10 years.  Varicella vaccine. An adult without evidence of immunity to varicella should receive 2 doses or a second dose if he has previously received 1 dose.  Human papillomavirus (HPV) vaccine. Males aged 49-21 years who have not received the vaccine previously should receive the 3-dose series. Males aged 22-26 years may be immunized. Immunization is recommended through the age of 34 years for any male who has sex with males and did not get any or all doses earlier. Immunization is recommended for any person with an immunocompromised condition through the age of 68 years if he did not get any or all doses earlier. During the 3-dose series, the second dose should be obtained 4-8 weeks after the first dose. The third dose should be obtained 24 weeks after the first dose and 16 weeks after the second dose.  Zoster vaccine. One dose is recommended for adults aged 44 years or older unless certain conditions are present.    PREVNAR  - Pneumococcal 13-valent conjugate (PCV13) vaccine. When indicated, a person who is uncertain of his immunization history and has no record of immunization should receive the PCV13 vaccine. An adult aged 21 years or older who has certain medical conditions and has not been previously immunized should receive 1 dose of PCV13 vaccine. This PCV13 should be followed with a dose of  pneumococcal polysaccharide (PPSV23) vaccine. The PPSV23 vaccine dose should be obtained at least 1 r more year(s) after the dose of PCV13 vaccine. An adult aged 1 years or older who has certain medical conditions and previously received 1 or more doses of PPSV23 vaccine should receive 1 dose of PCV13. The PCV13 vaccine dose should be obtained 1 or more years after the last PPSV23 vaccine dose.    PNEUMOVAX - Pneumococcal polysaccharide (PPSV23) vaccine. When PCV13 is also indicated, PCV13 should be obtained first. All adults aged 96 years and older  should be immunized. An adult younger than age 22 years who has certain medical conditions should be immunized. Any person who resides in a nursing home or long-term care facility should be immunized. An adult smoker should be immunized. People with an immunocompromised condition and certain other conditions should receive both PCV13 and PPSV23 vaccines. People with human immunodeficiency virus (HIV) infection should be immunized as soon as possible after diagnosis. Immunization during chemotherapy or radiation therapy should be avoided. Routine use of PPSV23 vaccine is not recommended for American Indians, [place], or people younger than 65 years unless there are medical conditions that require PPSV23 vaccine. When indicated, people who have unknown immunization and have no record of immunization should receive PPSV23 vaccine. One-time revaccination 5 years after the first dose of PPSV23 is recommended for people aged 19-64 years who have chronic kidney failure, nephrotic syndrome, asplenia, or immunocompromised conditions. People who received 1-2 doses of PPSV23 before age 90 years should receive another dose of PPSV23 vaccine at age 27 years or later if at least 5 years have passed since the previous dose. Doses of PPSV23 are not needed for people immunized with PPSV23 at or after age 28 years.    Hepatitis A vaccine. Adults who wish to be protected from this disease, have certain high-risk conditions, work with hepatitis A-infected animals, work in hepatitis A research labs, or travel to or work in countries with a high rate of hepatitis A should be immunized. Adults who were previously unvaccinated and who anticipate close contact with an international adoptee during the first 60 days after arrival in the [place] from a country with a high rate of hepatitis A should be immunized.    Hepatitis B vaccine. Adults should be immunized if they wish to be protected from this disease, have certain  high-risk conditions, may be exposed to blood or other infectious body fluids, are household contacts or sex partners of hepatitis B positive people, are clients or workers in certain care facilities, or travel to or work in countries with a high rate of hepatitis B.   Preventive Service / Frequency   Ages 47 to 87  Blood pressure check.  Lipid and cholesterol check  Lung cancer screening. / Every year if you are aged 44-80 years and have a 30-pack-year history of smoking and currently smoke or have quit within the past 15 years. Yearly screening is stopped once you have quit smoking for at least 15 years or develop a health problem that would prevent you from having lung cancer treatment.  Fecal occult blood test (FOBT) of stool. / Every year beginning at age 69 and continuing until age 60. You may not have to do this test if you get a colonoscopy every 10 years.  Flexible sigmoidoscopy** or colonoscopy.** / Every 5 years for a flexible sigmoidoscopy or every 10 years for a colonoscopy beginning at age 37 and continuing until age 89. Screening for abdominal  aortic aneurysm (AAA)  by ultrasound is recommended for people who have history of high blood pressure or who are current or former smokers. +++++++++++ Recommend Adult Low Dose Aspirin or  coated  Aspirin 81 mg daily  To reduce risk of Colon Cancer 40 %,  Skin Cancer 26 % ,  Malignant Melanoma 46%  and  Pancreatic cancer 60% ++++++++++++++++++++ Vitamin D goal  is between 70-100.  Please make sure that you are taking your Vitamin D as directed.  It is very important as a natural anti-inflammatory  helping hair, skin, and nails, as well as reducing stroke and heart attack risk.  It helps your bones and helps with mood. It also decreases numerous cancer risks so please take it as directed.  Low Vit D is associated with a 200-300% higher risk for CANCER  and 200-300% higher risk for HEART   ATTACK  &  STROKE.     .....................................Marland Kitchen It is also associated with higher death rate at younger ages,  autoimmune diseases like Rheumatoid arthritis, Lupus, Multiple Sclerosis.    Also many other serious conditions, like depression, Alzheimer's Dementia, infertility, muscle aches, fatigue, fibromyalgia - just to name a few. +++++++++++++++++++++ Recommend the book "The END of DIETING" by Dr Excell Seltzer  & the book "The END of DIABETES " by Dr Excell Seltzer At [facility]com - get book & Audio CD's    Being diabetic has a  300% increased risk for heart attack, stroke, cancer, and alzheimer- type vascular dementia. It is very important that you work harder with diet by avoiding all foods that are white. Avoid white rice (brown & wild rice is OK), white potatoes (sweetpotatoes in moderation is OK), White bread or wheat bread or anything made out of white flour like bagels, donuts, rolls, buns, biscuits, cakes, pastries, cookies, pizza crust, and pasta (made from white flour & egg whites) - vegetarian pasta or spinach or wheat pasta is OK. Multigrain breads like Arnold's or Pepperidge Farm, or multigrain sandwich thins or flatbreads.  Diet, exercise and weight loss can reverse and cure diabetes in the early stages.  Diet, exercise and weight loss is very important in the control and prevention of complications of diabetes which affects every system in your body, ie. Brain - dementia/stroke, eyes - glaucoma/blindness, heart - heart attack/heart failure, kidneys - dialysis, stomach - gastric paralysis, intestines - malabsorption, nerves - severe painful neuritis, circulation - gangrene & loss of a leg(s), and finally cancer and Alzheimers.    I recommend avoid fried & greasy foods,  sweets/candy, white rice (brown or wild rice or Quinoa is OK), white potatoes (sweet potatoes are OK) - anything made from white flour - bagels, doughnuts, rolls, buns, biscuits,white and wheat breads, pizza crust and traditional pasta  made of white flour & egg white(vegetarian pasta or spinach or wheat pasta is OK).  Multi-grain bread is OK - like multi-grain flat bread or sandwich thins. Avoid alcohol in excess. Exercise is also important.    Eat all the vegetables you want - avoid meat, especially red meat and dairy - especially cheese.  Cheese is the most concentrated form of trans-fats which is the worst thing to clog up our arteries. Veggie cheese is OK which can be found in the fresh produce section at [employer] or [employer] or [employer]  ++++++++++++++++++++++ DASH Eating Plan  DASH stands for "Dietary Approaches to Stop Hypertension."   The DASH eating plan is a healthy eating plan that has been shown to  reduce high blood pressure (hypertension). Additional health benefits may include reducing the risk of type 2 diabetes mellitus, heart disease, and stroke. The DASH eating plan may also help with weight loss. WHAT DO I NEED TO KNOW ABOUT THE DASH EATING PLAN? For the DASH eating plan, you will follow these general guidelines:  Choose foods with a percent daily value for sodium of less than 5% (as listed on the food label).  Use salt-free seasonings or herbs instead of table salt or sea salt.  Check with your health care provider or pharmacist before using salt substitutes.  Eat lower-sodium products, often labeled as "lower sodium" or "no salt added."  Eat fresh foods.  Eat more vegetables, fruits, and low-fat dairy products.  Choose whole grains. Look for the word "whole" as the first word in the ingredient list.  Choose fish   Limit sweets, desserts, sugars, and sugary drinks.  Choose heart-healthy fats.  Eat veggie cheese   Eat more home-cooked food and less restaurant, buffet, and fast food.  Limit fried foods.  Cook foods using methods other than frying.  Limit canned vegetables. If you do use them, rinse them well to decrease the sodium.  When eating at a restaurant, ask that your  food be prepared with less salt, or no salt if possible.                      WHAT FOODS CAN I EAT? Read Dr Fara Olden Fuhrman's books on The End of Dieting & The End of Diabetes  Grains Whole grain or whole wheat bread. Brown rice. Whole grain or whole wheat pasta. Quinoa, bulgur, and whole grain cereals. Low-sodium cereals. Corn or whole wheat flour tortillas. Whole grain cornbread. Whole grain crackers. Low-sodium crackers.  Vegetables Fresh or frozen vegetables (raw, steamed, roasted, or grilled). Low-sodium or reduced-sodium tomato and vegetable juices. Low-sodium or reduced-sodium tomato sauce and paste. Low-sodium or reduced-sodium canned vegetables.   Fruits All fresh, canned (in natural juice), or frozen fruits.  Protein Products  All fish and seafood.  Dried beans, peas, or lentils. Unsalted nuts and seeds. Unsalted canned beans.  Dairy Low-fat dairy products, such as skim or 1% milk, 2% or reduced-fat cheeses, low-fat ricotta or cottage cheese, or plain low-fat yogurt. Low-sodium or reduced-sodium cheeses.  Fats and Oils Tub margarines without trans fats. Light or reduced-fat mayonnaise and salad dressings (reduced sodium). Avocado. Safflower, olive, or canola oils. Natural peanut or almond butter.  Other Unsalted popcorn and pretzels. The items listed above may not be a complete list of recommended foods or beverages. Contact your dietitian for more options.  +++++++++++++++++++  WHAT FOODS ARE NOT RECOMMENDED? Grains/ White flour or wheat flour White bread. White pasta. White rice. Refined cornbread. Bagels and croissants. Crackers that contain trans fat.  Vegetables  Creamed or fried vegetables. Vegetables in a . Regular canned vegetables. Regular canned tomato sauce and paste. Regular tomato and vegetable juices.  Fruits Dried fruits. Canned fruit in light or heavy syrup. Fruit juice.  Meat and Other Protein Products Meat in general - RED meat & White meat.  Fatty  cuts of meat. Ribs, chicken wings, all processed meats as bacon, sausage, bologna, salami, fatback, hot dogs, bratwurst and packaged luncheon meats.  Dairy Whole or 2% milk, cream, half-and-half, and cream cheese. Whole-fat or sweetened yogurt. Full-fat cheeses or blue cheese. Non-dairy creamers and whipped toppings. Processed cheese, cheese spreads, or cheese curds.  Condiments Onion and garlic salt, seasoned salt, table  salt, and sea salt. Canned and packaged gravies. Worcestershire sauce. Tartar sauce. Barbecue sauce. Teriyaki sauce. Soy sauce, including reduced sodium. Steak sauce. Fish sauce. Oyster sauce. Cocktail sauce. Horseradish. Ketchup and mustard. Meat flavorings and tenderizers. Bouillon cubes. Hot sauce. Tabasco sauce. Marinades. Taco seasonings. Relishes.  Fats and Oils Butter, stick margarine, lard, shortening and bacon fat. Coconut, palm kernel, or palm oils. Regular salad dressings.  Pickles and olives. Salted popcorn and pretzels.  The items listed above may not be a complete list of foods and beverages to avoid.   Dehydration, Adult  Dehydration is a condition in which there is not enough fluid or water in the body. This happens when you lose more fluids than you take in. Important organs, such as the kidneys, brain, and heart, cannot function without a proper amount of fluids. Any loss of fluids from the body can lead to dehydration. Dehydration can range from mild to severe. This condition should be treated right away to prevent it from becoming severe. What are the causes? This condition may be caused by:  Vomiting.  Diarrhea.  Excessive sweating, such as from heat exposure or exercise.  Not drinking enough fluid, especially: ? When ill. ? While doing activity that requires a lot of energy.  Excessive urination.  Fever.  Infection.  Certain medicines, such as medicines that cause the body to lose excess fluid (diuretics).  Inability to access safe  drinking water.  Reduced physical ability to get adequate water and food. What increases the risk? This condition is more likely to develop in people:  Who have a poorly controlled long-term (chronic) illness, such as diabetes, heart disease, or kidney disease.  Who are age 40 or older.  Who are disabled.  Who live in a place with high altitude.  Who play endurance sports. What are the signs or symptoms? Symptoms of mild dehydration may include:  Thirst.  Dry lips.  Slightly dry mouth.  Dry, warm skin.  Dizziness. Symptoms of moderate dehydration may include:  Very dry mouth.  Muscle cramps.  Dark urine. Urine may be the color of tea.  Decreased urine production.  Decreased tear production.  Heartbeat that is irregular or faster than normal (palpitations).  Headache.  Light-headedness, especially when you stand up from a sitting position.  Fainting (syncope). Symptoms of severe dehydration may include:  Changes in skin, such as: ? Cold and clammy skin. ? Blotchy (mottled) or pale skin. ? Skin that does not quickly return to normal after being lightly pinched and released (poor skin turgor).  Changes in body fluids, such as: ? Extreme thirst. ? No tear production. ? Inability to sweat when body temperature is high, such as in hot weather. ? Very little urine production.  Changes in vital signs, such as: ? Weak pulse. ? Pulse that is more than 100 beats a minute when sitting still. ? Rapid breathing. ? Low blood pressure.  Other changes, such as: ? Sunken eyes. ? Cold hands and feet. ? Confusion. ? Lack of energy (lethargy). ? Difficulty waking up from sleep. ? Short-term weight loss. ? Unconsciousness. How is this diagnosed? This condition is diagnosed based on your symptoms and a physical exam. Blood and urine tests may be done to help confirm the diagnosis. How is this treated? Treatment for this condition depends on the severity. Mild or  moderate dehydration can often be treated at home. Treatment should be started right away. Do not wait until dehydration becomes severe. Severe dehydration is an emergency  and it needs to be treated in a hospital. Treatment for mild dehydration may include:  Drinking more fluids.  Replacing salts and minerals in your blood (electrolytes) that you may have lost. Treatment for moderate dehydration may include:  Drinking an oral rehydration solution (ORS). This is a drink that helps you replace fluids and electrolytes (rehydrate). It can be found at pharmacies and retail stores. Treatment for severe dehydration may include:  Receiving fluids through an IV tube.  Receiving an electrolyte solution through a feeding tube that is passed through your nose and into your stomach (nasogastric tube, or NG tube).  Correcting any abnormalities in electrolytes.  Treating the underlying cause of dehydration. Follow these instructions at home:  If directed by your health care provider, drink an ORS: ? Make an ORS by following instructions on the package. ? Start by drinking small amounts, about  cup (120 mL) every 5-10 minutes. ? Slowly increase how much you drink until you have taken the amount recommended by your health care provider.  Drink enough clear fluid to keep your urine clear or pale yellow. If you were told to drink an ORS, finish the ORS first, then start slowly drinking other clear fluids. Drink fluids such as: ? Water. Do not drink only water. Doing that can lead to having too little salt (sodium) in the body (hyponatremia). ? Ice chips. ? Fruit juice that you have added water to (diluted fruit juice). ? Low-calorie sports drinks.  Avoid: ? Alcohol. ? Drinks that contain a lot of sugar. These include high-calorie sports drinks, fruit juice that is not diluted, and soda. ? Caffeine. ? Foods that are greasy or contain a lot of fat or sugar.  Take over-the-counter and prescription  medicines only as told by your health care provider.  Do not take sodium tablets. This can lead to having too much sodium in the body (hypernatremia).  Eat foods that contain a healthy balance of electrolytes, such as bananas, oranges, potatoes, tomatoes, and spinach.  Keep all follow-up visits as told by your health care provider. This is important. Contact a health care provider if:  You have abdominal pain that: ? Gets worse. ? Stays in one area (localizes).  You have a rash.  You have a stiff neck.  You are more irritable than usual.  You are sleepier or more difficult to wake up than usual.  You feel weak or dizzy.  You feel very thirsty.  You have urinated only a small amount of very dark urine over 6-8 hours. Get help right away if:  You have symptoms of severe dehydration.  You cannot drink fluids without vomiting.  Your symptoms get worse with treatment.  You have a fever.  You have a severe headache.  You have vomiting or diarrhea that: ? Gets worse. ? Does not go away.  You have blood or green matter (bile) in your vomit.  You have blood in your stool. This may cause stool to look black and tarry.  You have not urinated in 6-8 hours.  You faint.  Your heart rate while sitting still is over 100 beats a minute.  You have trouble breathing. This information is not intended to replace advice given to you by your health care provider. Make sure you discuss any questions you have with your health care provider. Document Released: 12/18/2004 Document Revised: 11/30/2016 Document Reviewed: 02/11/2015 Elsevier Patient Education  2020 [employer].

## 2018-12-24 LAB — COMPLETE METABOLIC PANEL WITH GFR
AG Ratio: 1.4 (calc) (ref 1.0–2.5)
ALT: 134 U/L — ABNORMAL HIGH (ref 9–46)
AST: 161 U/L — ABNORMAL HIGH (ref 10–35)
Albumin: 4 g/dL (ref 3.6–5.1)
Alkaline phosphatase (APISO): 57 U/L (ref 35–144)
BUN: 15 mg/dL (ref 7–25)
CO2: 34 mmol/L — ABNORMAL HIGH (ref 20–32)
Calcium: 9.5 mg/dL (ref 8.6–10.3)
Chloride: 98 mmol/L (ref 98–110)
Creat: 1.1 mg/dL (ref 0.70–1.33)
GFR, Est African American: 90 mL/min/{1.73_m2} (ref >=60)
GFR, Est Non African American: 77 mL/min/{1.73_m2} (ref >=60)
Globulin: 2.8 g/dL (calc) (ref 1.9–3.7)
Glucose, Bld: 120 mg/dL — ABNORMAL HIGH (ref 65–99)
Potassium: 3.5 mmol/L (ref 3.5–5.3)
Sodium: 143 mmol/L (ref 135–146)
Total Bilirubin: 0.9 mg/dL (ref 0.2–1.2)
Total Protein: 6.8 g/dL (ref 6.1–8.1)

## 2018-12-24 LAB — VITAMIN D 25 HYDROXY (VIT D DEFICIENCY, FRACTURES): Vit D, 25-Hydroxy: 99 ng/mL (ref 30–100)

## 2018-12-24 LAB — HEMOGLOBIN A1C
Hgb A1c MFr Bld: 5.4 % of total Hgb (ref <5.7)
Mean Plasma Glucose: 108 (calc)
eAG (mmol/L): 6 (calc)

## 2018-12-24 LAB — CBC WITH DIFFERENTIAL/PLATELET
Absolute Monocytes: 745 cells/uL (ref 200–950)
Basophils Absolute: 29 cells/uL (ref 0–200)
Basophils Relative: 0.6 %
Eosinophils Absolute: 172 cells/uL (ref 15–500)
Eosinophils Relative: 3.5 %
HCT: 39 % (ref 38.5–50.0)
Hemoglobin: 14 g/dL (ref 13.2–17.1)
Lymphs Abs: 985 cells/uL (ref 850–3900)
MCH: 35.8 pg — ABNORMAL HIGH (ref 27.0–33.0)
MCHC: 35.9 g/dL (ref 32.0–36.0)
MCV: 99.7 fL (ref 80.0–100.0)
MPV: 10.5 fL (ref 7.5–12.5)
Monocytes Relative: 15.2 %
Neutro Abs: 2969 cells/uL (ref 1500–7800)
Neutrophils Relative %: 60.6 %
Platelets: 159 10*3/uL (ref 140–400)
RBC: 3.91 10*6/uL — ABNORMAL LOW (ref 4.20–5.80)
RDW: 15.3 % — ABNORMAL HIGH (ref 11.0–15.0)
Total Lymphocyte: 20.1 %
WBC: 4.9 10*3/uL (ref 3.8–10.8)

## 2018-12-24 LAB — LIPID PANEL
Cholesterol: 166 mg/dL (ref <200)
HDL: 47 mg/dL (ref >=40)
LDL Cholesterol (Calc): 94 mg/dL (calc)
Non-HDL Cholesterol (Calc): 119 mg/dL (calc) (ref <130)
Total CHOL/HDL Ratio: 3.5 (calc) (ref <5.0)
Triglycerides: 153 mg/dL — ABNORMAL HIGH (ref <150)

## 2018-12-24 LAB — MAGNESIUM: Magnesium: 1.4 mg/dL — ABNORMAL LOW (ref 1.5–2.5)

## 2018-12-24 LAB — URIC ACID: Uric Acid, Serum: 7.2 mg/dL (ref 4.0–8.0)

## 2018-12-24 LAB — TSH: TSH: 1.89 mIU/L (ref 0.40–4.50)

## 2019-03-22 ENCOUNTER — Encounter: Payer: Self-pay | Admitting: Internal Medicine

## 2019-03-22 NOTE — Progress Notes (Signed)
Annual  Screening/Preventative Visit  & Comprehensive Evaluation & Examination     This very nice 54 y.o. DWM presents for a Screening /Preventative Visit & comprehensive evaluation and management of multiple medical co-morbidities.  Patient has been followed for HTN, HLD, Essential Tremor, Prediabetes and Vitamin D Deficiency. Patient's Gout is controlled on allopurinol.     HTN predates circa 2006. Patient's BP has been controlled and today's BP is at goal - 132/80. Patient denies any cardiac symptoms as chest pain, palpitations, shortness of breath, dizziness or ankle swelling.     Patient's hyperlipidemia is controlled with diet and medications. Patient denies myalgias or other medication SE's. Last lipids were at goal:  Lab Results  Component Value Date   CHOL 166 12/23/2018   HDL 47 12/23/2018   LDLCALC 94 12/23/2018   TRIG 153 (H) 12/23/2018   CHOLHDL 3.5 12/23/2018       Patient has been followed expectantly for glucose intoleance and patient denies reactive hypoglycemic symptoms, visual blurring, diabetic polys or paresthesias. Last A1c was Normal & at goal:  Lab Results  Component Value Date   HGBA1C 5.4 12/23/2018        Finally, patient has history of Vitamin D Deficiency  ("25" /2008)  and last vitamin D was at goal:  Lab Results  Component Value Date   VD25OH 99 12/23/2018    Current Outpatient Medications on File Prior to Visit  Medication Sig  . acyclovir (ZOVIRAX) 800 MG tablet TAKE 1 TABLET DAILY FOR FEVER BLISTERS (Patient taking differently: TAKE 1 TABLET DAILY FOR FEVER BLISTERS PRN)  . allopurinol (ZYLOPRIM) 300 MG tablet Take 1 tablet Daily to Prevent Gout  . atorvastatin (LIPITOR) 80 MG tablet Take 1 tablet Daily for Cholesterol  . Cholecalciferol (VITAMIN D3) 400 units CAPS Take 5,000 Units by mouth daily.   . diazepam (VALIUM) 5 MG tablet Take 1/2 to 1 tablet 3 x /day for tremor  . hydrochlorothiazide (HYDRODIURIL) 25 MG tablet Take 1 tablet Daily  for BP & Fluid Retention / Ankle Swelling  . loratadine-pseudoephedrine (CLARITIN-D 24 HOUR) 10-240 MG 24 hr tablet Take 1 tablet by mouth daily. (Patient taking differently: Take 1 tablet by mouth as needed. )  . omeprazole (PRILOSEC) 20 MG capsule Take 1 capsule (20 mg total) by mouth daily before breakfast.  . propranolol ER (INDERAL LA) 120 MG 24 hr capsule Take 1 capsule Daily for BP & Tremor   No current facility-administered medications on file prior to visit.   Allergies  Allergen Reactions  . Citalopram     dyphoria   Past Medical History:  Diagnosis Date  . Anxiety   . Barrett's esophagus 06/30/2018  . Encounter for long-term (current) use of other medications   . Hx of adenomatous colonic polyps 06/30/2018  . Hyperlipidemia   . Hypertension   . Hypogonadism male   . Vitamin D deficiency    Health Maintenance  Topic Date Due  . INFLUENZA VACCINE  08/02/2018  . COLONOSCOPY  06/24/2021  . TETANUS/TDAP  07/31/2025  . HIV Screening  Completed   Immunization History  Administered Date(s) Administered  . Influenza Inj Mdck Quad With Preservative 09/30/2017  . Influenza-Unspecified 10/07/2012  . PPD Test 01/12/2014, 08/01/2015, 09/10/2016  . Pneumococcal-Unspecified 02/11/2008  . Td 12/05/2005  . Tdap 08/01/2015   Last Colon - 06/25/2018- Dr Carlean Purl - Adenomas - Recc 3 year f/u - due July 2023  Last EGD - 06.24.2020 -  Dr Carlean Purl - short segment Barrett's -  Recc 3 yr f/u - due July 2023  Past Surgical History:  Procedure Laterality Date  . HERNIA REPAIR    . I & D EXTREMITY Right 06/19/2012   Procedure: IRRIGATION AND DEBRIDEMENT FOREARM;  Surgeon: Tennis Must, MD;  Location: [facility];  Service: Orthopedics;  Laterality: Right;  . KNEE ARTHROSCOPY WITH ANTERIOR CRUCIATE LIGAMENT (ACL) REPAIR Left Jan 2017   Dr. Ballard Russell  . LASIK  2006  . VASECTOMY     Family History  Problem Relation Age of Onset  . Cancer Mother        Stomach, lymphoma, melanoma  . Diabetes Mother    . Heart disease Mother   . Stomach cancer Maternal Grandmother   . Colon cancer Neg Hx   . Esophageal cancer Neg Hx    Social History   Socioeconomic History  . Marital status: Separated & still not divorced     . Number of children: None  Occupational History  . Retired Systems analyst  Tobacco Use  . Smoking status: Never Smoker  . Smokeless tobacco: Never Used  Substance and Sexual Activity  . Alcohol use: Yes  . Drug use: No  . 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 Does c/o fatigue. Eyes: Denies redness, blurred vision, diplopia, discharge, itchy or watery eyes.  ENT: Denies discharge, congestion, post nasal drip, epistaxis, sore throat, earache, hearing loss, dental pain, Tinnitus, Vertigo, Sinus pain or snoring.  Cardio: Denies chest pain, palpitations, irregular heartbeat, syncope, dyspnea, diaphoresis, orthopnea, PND, claudication or edema Respiratory: denies cough, dyspnea, DOE, pleurisy, hoarseness, laryngitis or wheezing.  Gastrointestinal: Denies dysphagia, heartburn, reflux, water brash, pain, cramps, nausea, vomiting, bloating, diarrhea, constipation, hematemesis, melena, hematochezia, jaundice or hemorrhoids Genitourinary: Denies dysuria, frequency, urgency, nocturia, hesitancy, discharge, hematuria or flank pain Musculoskeletal: Denies arthralgia, myalgia, stiffness, Jt. Swelling, pain, limp or strain/sprain. Denies Falls. Skin: Denies puritis, rash, hives, warts, acne, eczema or change in skin lesion Neuro: No weakness, tremor, incoordination, spasms, paresthesia or pain Psychiatric: Denies confusion, memory loss or sensory loss. Denies Depression. Endocrine: Denies change in weight,  skin, hair change, nocturia, and paresthesia, diabetic polys, visual blurring or hyper / hypo glycemic episodes.  Heme/Lymph: No excessive bleeding, bruising or enlarged lymph nodes.  Physical Exam  BP 132/80   Pulse 84   Temp (!) 97.2 F (36.2 C)   Resp 16   Ht 5\' 11"  (1.803 m)   Wt 210 lb 9.6 oz (95.5 kg)   BMI 29.37 kg/m   General Appearance: Well nourished and well groomed and in no apparent distress.  Eyes: PERRLA, EOMs, conjunctiva no swelling or erythema, normal fundi and vessels. Sinuses: No frontal/maxillary tenderness ENT/Mouth: EACs patent / TMs  nl. Nares clear without erythema, swelling, mucoid exudates. Oral hygiene is good. No erythema, swelling, or exudate. Tongue normal, non-obstructing. Tonsils not swollen or erythematous. Hearing normal.  Neck: Supple, thyroid not palpable. No bruits, nodes or JVD. Respiratory: Respiratory effort normal.  BS equal and clear bilateral without rales, rhonci, wheezing or stridor. Cardio: Heart sounds are normal with regular rate and rhythm and no murmurs, rubs or gallops. Peripheral pulses are normal and equal bilaterally without edema. No aortic or femoral bruits. Chest: symmetric with normal  excursions and percussion.  Abdomen: Soft, with Nl bowel sounds. Nontender, no guarding, rebound, hernias, masses, or organomegaly.  Lymphatics: Non tender without lymphadenopathy.  Musculoskeletal: Full ROM all peripheral extremities, joint stability, 5/5 strength, and normal gait. Skin: Warm and dry without rashes, lesions, cyanosis, clubbing or  ecchymosis.  Neuro: Cranial nerves intact, reflexes equal bilaterally. Normal muscle tone, no cerebellar symptoms. Sensation intact.  Pysch: Alert and oriented X 3 with normal affect, insight and judgment appropriate.   Assessment and Plan  1. Annual Preventative/Screening Exam   2. Essential hypertension  - EKG 12-Lead - [place], RETROPERITNL ABD,  LTD - Urinalysis, Routine w reflex microscopic -  Microalbumin / creatinine urine ratio - CBC with Differential/Platelet - COMPLETE METABOLIC PANEL WITH GFR - Magnesium - TSH  3. Hyperlipidemia, mixed  - EKG 12-Lead - [place], RETROPERITNL ABD,  LTD - Lipid panel - TSH  4. Abnormal glucose  - EKG 12-Lead - [place], RETROPERITNL ABD,  LTD - Hemoglobin A1c - Insulin, random  5. Vitamin D deficiency  - VITAMIN D 25 Hydroxy   6. Gastroesophageal reflux disease  - CBC with Differential/Platelet  7. Gout  - Uric acid  8. Screening for colorectal cancer  - POC Hemoccult Bld/Stl  9. Prostate cancer screening  - PSA  10. Screening for ischemic heart disease  - EKG 12-Lead  11. FHx: heart disease  - EKG 12-Lead  12. Screening for AAA (aortic abdominal aneurysm)  - [place], RETROPERITNL ABD,  LTD  13. Screening examination for pulmonary tuberculosis   14. Fatigue, unspecified type  - Iron,Total/Total Iron Binding Cap - Vitamin B12 - Testosterone - CBC with Differential/Platelet - TSH  15. Medication management  - Urinalysis, Routine w reflex microscopic - Microalbumin / creatinine urine ratio - CBC with Differential/Platelet - COMPLETE METABOLIC PANEL WITH GFR - Magnesium - Lipid panel - TSH - Hemoglobin A1c - Insulin, random - VITAMIN D 25 Hydroxy - Uric acid        Patient was counseled in prudent diet, weight control to achieve/maintain BMI less than 25, BP monitoring, regular exercise and medications as discussed.  Discussed med effects and SE's. Routine screening labs and tests as requested with regular follow-up as recommended. Over 40 minutes of exam, counseling, chart review and high complex critical decision making was performed   Kirtland Bouchard, MD

## 2019-03-22 NOTE — Patient Instructions (Signed)

## 2019-03-23 ENCOUNTER — Other Ambulatory Visit: Payer: Self-pay

## 2019-03-23 ENCOUNTER — Ambulatory Visit (INDEPENDENT_AMBULATORY_CARE_PROVIDER_SITE_OTHER): Payer: 59 | Admitting: Internal Medicine

## 2019-03-23 VITALS — BP 132/80 | HR 84 | Temp 97.2°F | Resp 16 | Ht 71.0 in | Wt 210.6 lb

## 2019-03-23 DIAGNOSIS — Z125 Encounter for screening for malignant neoplasm of prostate: Secondary | ICD-10-CM

## 2019-03-23 DIAGNOSIS — Z0001 Encounter for general adult medical examination with abnormal findings: Secondary | ICD-10-CM

## 2019-03-23 DIAGNOSIS — E559 Vitamin D deficiency, unspecified: Secondary | ICD-10-CM

## 2019-03-23 DIAGNOSIS — Z1211 Encounter for screening for malignant neoplasm of colon: Secondary | ICD-10-CM

## 2019-03-23 DIAGNOSIS — Z0000 Encounter for general adult medical examination without abnormal findings: Secondary | ICD-10-CM | POA: Diagnosis not present

## 2019-03-23 DIAGNOSIS — Z136 Encounter for screening for cardiovascular disorders: Secondary | ICD-10-CM | POA: Diagnosis not present

## 2019-03-23 DIAGNOSIS — I10 Essential (primary) hypertension: Secondary | ICD-10-CM | POA: Diagnosis not present

## 2019-03-23 DIAGNOSIS — Z79899 Other long term (current) drug therapy: Secondary | ICD-10-CM

## 2019-03-23 DIAGNOSIS — Z8249 Family history of ischemic heart disease and other diseases of the circulatory system: Secondary | ICD-10-CM | POA: Diagnosis not present

## 2019-03-23 DIAGNOSIS — M109 Gout, unspecified: Secondary | ICD-10-CM

## 2019-03-23 DIAGNOSIS — R5383 Other fatigue: Secondary | ICD-10-CM

## 2019-03-23 DIAGNOSIS — K219 Gastro-esophageal reflux disease without esophagitis: Secondary | ICD-10-CM

## 2019-03-23 DIAGNOSIS — E782 Mixed hyperlipidemia: Secondary | ICD-10-CM

## 2019-03-23 DIAGNOSIS — Z111 Encounter for screening for respiratory tuberculosis: Secondary | ICD-10-CM

## 2019-03-23 DIAGNOSIS — R7309 Other abnormal glucose: Secondary | ICD-10-CM

## 2019-03-23 MED ORDER — PROPRANOLOL HCL 40 MG PO TABS: ORAL_TABLET | ORAL | 3 refills | Status: DC

## 2019-04-26 NOTE — Progress Notes (Signed)
History of Present Illness:      This very nice 54 y.o. DWM with HTN, HLD, Essential Tremor, Prediabetes, GERD/Barrett's, Gout and Vitamin D Deficiency. Patient has Essential Tremor and has been on Diazepam and 1 month ago had Propranolol added - switching from the 120 mg qd to 40 mg 3 - 4 x /day for $ concerns. His twin brother is on hospice with Alcoholic Cirrhosis and refractory ascites. Long discussion re: patient's alcohol use and he is agreeable to starting an old Rx of Naltrexone that he never started. He also relates tendency toward Depression denying SI and he agreeable to trying Wellbutrin.   Medications  .  atorvastatin 80 MG tablet, Take 1 tablet Daily for Cholesterol .  hydrochlorothiazide 25 MG tablet, Take 1 tablet Daily for BP .  propranolol 40 MG tablet, Take 1 tablet 3 to 4 x /day for BP & Tremor  .  CLARITIN-D 24  , Take 1 tablet  daily. Marland Kitchen  allopurinol  300 MG tablet, Take 1 tablet Daily to Prevent Gout .  Acyclovir 800 MG , TAKE 1 TABLET DAILY FOR FEVER BLISTERS PRN) .  VITAMIN D 400 units , Take 5,000 Units  daily.  .  diazepam  5 MG tablet, Take 1/2 to 1 tablet 3 x /day for tremor .  omeprazole  20 MG capsule, Take 1 capsule  daily before breakfast.  Problem list He has Hypertension; Hyperlipidemia, mixed; Vitamin D deficiency; Medication management; Abnormal glucose; Overweight (BMI 25.0-29.9); GERD (gastroesophageal reflux disease); Anxiety; Chronic gout involving toe of left foot without tophus; Hepatic steatosis with borderline hepatomegaly; Persistent proteinuria; Hx of adenomatous colonic polyps; and Barrett's esophagus on their problem list.   Observations/Objective:  BP 124/80   P 80   T 97.4 F )   R 16   Ht 5\' 11"     Wt 212 lb    BMI 29.65  HEENT - WNL. Neck - supple.  Chest - Clear equal BS. Cor - Nl HS. RRR w/o sig MGR. PP 1(+). No edema. MS- FROM w/o deformities.  Gait Nl. Neuro -  Nl w/o focal abnormalities.  Assessment and Plan:  1.  Essential hypertension  - Urinalysis, Routine w reflex microscopic - Microalbumin / creatinine urine ratio - CBC with Differential/Platelet - COMPLETE METABOLIC PANEL WITH GFR - Magnesium - TSH  2. Hereditary essential tremor   3. Hyperlipidemia, mixed  - Lipid panel - TSH  4. Abnormal glucose  - Hemoglobin A1c - Insulin, random  5. Vitamin D deficiency  - VITAMIN D 25 Hydroxy   6. Gastroesophageal reflux disease  - CBC with Differential/Platelet  7. Gout  - Uric acid  8. Prostate cancer screening  - PSA  9. Fatigue, unspecified type  - Iron,Total/Total Iron Binding Cap - Vitamin B12 - Testosterone - CBC with Differential/Platelet - TSH  10. Medication management  - Urinalysis, Routine w reflex microscopic - Microalbumin / creatinine urine ratio - Uric acid - CBC with Differential/Platelet - COMPLETE METABOLIC PANEL WITH GFR - Magnesium - Lipid panel - TSH - Hemoglobin A1c - Insulin, random - VITAMIN D 25 Hydroxy  11. Current mild episode of major depressive disorder without prior episode (HCC)  - buPROPion-XL 300 MG ; Take 1 tablet Daily for Mood, Focus & Concentration.  Dispense: 90; Rf: 3 -Patient also agreeable to starting old Rx Naltrexone   - Labs drawn today that were not done at recent CPE  Follow Up Instructions:  I discussed the assessment and treatment  plan with the patient. The patient was provided an opportunity to ask questions and all were answered. The patient agreed with the plan and demonstrated an understanding of the instructions.  Kirtland Bouchard, MD

## 2019-04-26 NOTE — Patient Instructions (Signed)

## 2019-04-27 ENCOUNTER — Other Ambulatory Visit: Payer: Self-pay

## 2019-04-27 ENCOUNTER — Ambulatory Visit: Payer: 59 | Admitting: Internal Medicine

## 2019-04-27 VITALS — BP 124/80 | HR 80 | Temp 97.4°F | Resp 16 | Ht 71.0 in | Wt 212.6 lb

## 2019-04-27 DIAGNOSIS — E559 Vitamin D deficiency, unspecified: Secondary | ICD-10-CM

## 2019-04-27 DIAGNOSIS — M109 Gout, unspecified: Secondary | ICD-10-CM

## 2019-04-27 DIAGNOSIS — E782 Mixed hyperlipidemia: Secondary | ICD-10-CM

## 2019-04-27 DIAGNOSIS — R5383 Other fatigue: Secondary | ICD-10-CM

## 2019-04-27 DIAGNOSIS — G250 Essential tremor: Secondary | ICD-10-CM | POA: Diagnosis not present

## 2019-04-27 DIAGNOSIS — I10 Essential (primary) hypertension: Secondary | ICD-10-CM

## 2019-04-27 DIAGNOSIS — K219 Gastro-esophageal reflux disease without esophagitis: Secondary | ICD-10-CM

## 2019-04-27 DIAGNOSIS — Z125 Encounter for screening for malignant neoplasm of prostate: Secondary | ICD-10-CM

## 2019-04-27 DIAGNOSIS — Z79899 Other long term (current) drug therapy: Secondary | ICD-10-CM

## 2019-04-27 DIAGNOSIS — R7309 Other abnormal glucose: Secondary | ICD-10-CM

## 2019-04-27 DIAGNOSIS — F320 Major depressive disorder, single episode, mild: Secondary | ICD-10-CM

## 2019-04-27 MED ORDER — BUPROPION HCL ER (XL) 300 MG PO TB24: ORAL_TABLET | ORAL | 3 refills | Status: DC

## 2019-04-28 LAB — LIPID PANEL
Cholesterol: 167 mg/dL (ref <200)
HDL: 60 mg/dL (ref >=40)
LDL Cholesterol (Calc): 85 mg/dL (calc)
Non-HDL Cholesterol (Calc): 107 mg/dL (calc) (ref <130)
Total CHOL/HDL Ratio: 2.8 (calc) (ref <5.0)
Triglycerides: 122 mg/dL (ref <150)

## 2019-04-28 LAB — CBC WITH DIFFERENTIAL/PLATELET
Absolute Monocytes: 542 cells/uL (ref 200–950)
Basophils Absolute: 29 cells/uL (ref 0–200)
Basophils Relative: 0.6 %
Eosinophils Absolute: 82 cells/uL (ref 15–500)
Eosinophils Relative: 1.7 %
HCT: 41.8 % (ref 38.5–50.0)
Hemoglobin: 14.3 g/dL (ref 13.2–17.1)
Lymphs Abs: 816 cells/uL — ABNORMAL LOW (ref 850–3900)
MCH: 34.5 pg — ABNORMAL HIGH (ref 27.0–33.0)
MCHC: 34.2 g/dL (ref 32.0–36.0)
MCV: 101 fL — ABNORMAL HIGH (ref 80.0–100.0)
MPV: 10.7 fL (ref 7.5–12.5)
Monocytes Relative: 11.3 %
Neutro Abs: 3331 cells/uL (ref 1500–7800)
Neutrophils Relative %: 69.4 %
Platelets: 130 10*3/uL — ABNORMAL LOW (ref 140–400)
RBC: 4.14 10*6/uL — ABNORMAL LOW (ref 4.20–5.80)
RDW: 14 % (ref 11.0–15.0)
Total Lymphocyte: 17 %
WBC: 4.8 10*3/uL (ref 3.8–10.8)

## 2019-04-28 LAB — URINALYSIS, ROUTINE W REFLEX MICROSCOPIC
Bilirubin Urine: NEGATIVE
Glucose, UA: NEGATIVE
Hyaline Cast: NONE SEEN /LPF
Ketones, ur: NEGATIVE
Leukocytes,Ua: NEGATIVE
Nitrite: NEGATIVE
Specific Gravity, Urine: 1.017 (ref 1.001–1.03)
pH: 5.5 (ref 5.0–8.0)

## 2019-04-28 LAB — HEMOGLOBIN A1C
Hgb A1c MFr Bld: 6.1 % of total Hgb — ABNORMAL HIGH (ref <5.7)
Mean Plasma Glucose: 128 (calc)
eAG (mmol/L): 7.1 (calc)

## 2019-04-28 LAB — COMPLETE METABOLIC PANEL WITH GFR
AG Ratio: 1.3 (calc) (ref 1.0–2.5)
ALT: 73 U/L — ABNORMAL HIGH (ref 9–46)
AST: 78 U/L — ABNORMAL HIGH (ref 10–35)
Albumin: 4.3 g/dL (ref 3.6–5.1)
Alkaline phosphatase (APISO): 64 U/L (ref 35–144)
BUN: 16 mg/dL (ref 7–25)
CO2: 32 mmol/L (ref 20–32)
Calcium: 9.3 mg/dL (ref 8.6–10.3)
Chloride: 101 mmol/L (ref 98–110)
Creat: 1.09 mg/dL (ref 0.70–1.33)
GFR, Est African American: 91 mL/min/{1.73_m2} (ref >=60)
GFR, Est Non African American: 78 mL/min/{1.73_m2} (ref >=60)
Globulin: 3.2 g/dL (calc) (ref 1.9–3.7)
Glucose, Bld: 131 mg/dL — ABNORMAL HIGH (ref 65–99)
Potassium: 4 mmol/L (ref 3.5–5.3)
Sodium: 142 mmol/L (ref 135–146)
Total Bilirubin: 0.8 mg/dL (ref 0.2–1.2)
Total Protein: 7.5 g/dL (ref 6.1–8.1)

## 2019-04-28 LAB — MICROALBUMIN / CREATININE URINE RATIO
Creatinine, Urine: 176 mg/dL (ref 20–320)
Microalb Creat Ratio: 465 mcg/mg creat — ABNORMAL HIGH (ref <30)
Microalb, Ur: 81.8 mg/dL

## 2019-04-28 LAB — IRON, TOTAL/TOTAL IRON BINDING CAP
%SAT: 33 % (calc) (ref 20–48)
Iron: 100 ug/dL (ref 50–180)
TIBC: 301 mcg/dL (calc) (ref 250–425)

## 2019-04-28 LAB — INSULIN, RANDOM: Insulin: 24.4 u[IU]/mL — ABNORMAL HIGH

## 2019-04-28 LAB — PSA: PSA: 3 ng/mL (ref <=4.0)

## 2019-04-28 LAB — TSH: TSH: 1.63 mIU/L (ref 0.40–4.50)

## 2019-04-28 LAB — VITAMIN B12: Vitamin B-12: 774 pg/mL (ref 200–1100)

## 2019-04-28 LAB — URIC ACID: Uric Acid, Serum: 4.3 mg/dL (ref 4.0–8.0)

## 2019-04-28 LAB — TESTOSTERONE: Testosterone: 284 ng/dL (ref 250–827)

## 2019-04-28 LAB — VITAMIN D 25 HYDROXY (VIT D DEFICIENCY, FRACTURES): Vit D, 25-Hydroxy: 72 ng/mL (ref 30–100)

## 2019-04-28 LAB — MAGNESIUM: Magnesium: 1.3 mg/dL — ABNORMAL LOW (ref 1.5–2.5)

## 2019-06-24 NOTE — Progress Notes (Deleted)
FOLLOW UP  Assessment and Plan:   Hypertension Well controlled with current medications  Monitor blood pressure at home; patient to call if consistently greater than 130/80 Continue DASH diet.   Reminder to go to the ER if any CP, SOB, nausea, dizziness, severe HA, changes vision/speech, left arm numbness and tingling and jaw pain.  Cholesterol Currently at goal; continue atorvastatin 80 mg daily Continue low cholesterol diet and exercise.  Check lipid panel.   Other abnormal glucose Recent A1Cs at goal Discussed diet/exercise, weight management  Defer A1C; check CMP  GERD Well managed on current medications; Discussed diet, avoiding triggers and other lifestyle changes  Overweight Long discussion about weight loss, diet, and exercise Recommended diet heavy in fruits and veggies and low in animal meats, cheeses, and dairy products, appropriate calorie intake Discussed ideal weight for height Will follow up in 3 months  Vitamin D Def At goal at last visit; continue supplementation to maintain goal of 70-100 Defer Vit D level  Anxiety Well managed by current regimen; he reports daytime stress is fairly managed and wants to avoid a daily medication. continue PRN medications Stress management techniques discussed, increase water, good sleep hygiene discussed, increase exercise, and increase veggies.   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 Further disposition pending results of labs. Discussed med's effects and SE's.   Over 30  minutes of exam, counseling, chart review, and critical decision making was performed.   Future Appointments  Date Time Provider [place]  06/25/2019 11:15 AM Liane Comber, NP [facility] None  03/29/2020  9:00 AM Unk Pinto, MD [facility] None    ----------------------------------------------------------------------------------------------------------------------  HPI 54 y.o. male  presents for 3 month follow up on hypertension, cholesterol, prediabetes, GERD, anxiety, weight and vitamin D deficiency.   he has a diagnosis of anxiety and is currently on xanax 1 mg TID PRN - he currently typically takes 1 tab at night for sleep after stressful day at work, uses very rarely during the day - previously treated by [place] with severe SE, reports symptoms are well controlled on current regimen.  Hasn't done well trying to switch agents, declines alternative daily agent at this time.   he has a diagnosis of GERD which is currently managed by PRN pepcid 40 mg daily (OTC) he reports symptoms is currently well controlled, and denies breakthrough reflux, burning in chest, hoarseness or cough.    BMI is There is no height or weight on file to calculate BMI., he has been working on diet but admits less exercise due to gyms closing.   Wt Readings from Last 3 Encounters:  04/27/19 212 lb 9.6 oz (96.4 kg)  03/23/19 210 lb 9.6 oz (95.5 kg)  12/23/18 206 lb (93.4 kg)   His blood pressure has been controlled at home, today their BP is    He does workout. He denies chest pain, shortness of breath, dizziness.   He is on cholesterol medication (atorvastatin 40 mg daily) and denies myalgias. His cholesterol is not at goal. The cholesterol last visit was:   Lab Results  Component Value Date   CHOL 167 04/27/2019  HDL 60 04/27/2019   LDLCALC 85 04/27/2019   TRIG 122 04/27/2019   CHOLHDL 2.8 04/27/2019    He has not been working on diet and exercise for glucose management, and denies  increased appetite, nausea, paresthesia of the feet, polydipsia, polyuria, visual disturbances and vomiting. Last A1C in the office was:  Lab Results  Component Value Date   HGBA1C 6.1 (H) 04/27/2019   Patient is on Vitamin D supplement and at goal at recent check:    Lab Results  Component Value Date   VD25OH 72 04/27/2019     Patient is on allopurinol for gout, was off at last check but restarted after he started having flare symptoms.  Lab Results  Component Value Date   LABURIC 4.3 04/27/2019   Has had persistent proteinuria, microscopic hematuria, persistent microalbuminuria, was referred to nephrology.  Saw Dr. Carolin Sicks in July 2021, felt r/t htn, NSAID, also checked ANA, ANCA, antiGBM ***    Current Medications:  Current Outpatient Medications on File Prior to Visit  Medication Sig  . acyclovir (ZOVIRAX) 800 MG tablet TAKE 1 TABLET DAILY FOR FEVER BLISTERS (Patient taking differently: TAKE 1 TABLET DAILY FOR FEVER BLISTERS PRN)  . allopurinol (ZYLOPRIM) 300 MG tablet Take 1 tablet Daily to Prevent Gout  . atorvastatin (LIPITOR) 80 MG tablet Take 1 tablet Daily for Cholesterol  . buPROPion (WELLBUTRIN XL) 300 MG 24 hr tablet Take 1 tablet Daily for Mood, Focus & Concentration.  . Cholecalciferol (VITAMIN D3) 400 units CAPS Take 5,000 Units by mouth daily.   . diazepam (VALIUM) 5 MG tablet Take 1/2 to 1 tablet 3 x /day for tremor  . hydrochlorothiazide (HYDRODIURIL) 25 MG tablet Take 1 tablet Daily for BP & Fluid Retention / Ankle Swelling  . loratadine-pseudoephedrine (CLARITIN-D 24 HOUR) 10-240 MG 24 hr tablet Take 1 tablet by mouth daily. (Patient taking differently: Take 1 tablet by mouth as needed. )  . omeprazole (PRILOSEC) 20 MG capsule Take 1 capsule (20 mg total) by mouth daily before breakfast.  . propranolol (INDERAL) 40 MG tablet Take 1 tablet 3 to 4 x /day for BP & Tremor   No current facility-administered medications on file prior to visit.     Allergies:   Allergies  Allergen Reactions  . Citalopram     dyphoria     Medical History:  Past Medical History:  Diagnosis Date  . Anxiety   . Barrett's esophagus 06/30/2018  . Encounter for long-term (current) use of other medications   . Hx of adenomatous colonic polyps 06/30/2018  . Hyperlipidemia   . Hypertension   . Hypogonadism male   . Vitamin D deficiency    Family history- Reviewed and unchanged Social history- Reviewed and unchanged   Review of Systems:  Review of Systems  Constitutional: Negative for malaise/fatigue and weight loss.  HENT: Negative for hearing loss and tinnitus.   Eyes: Negative for blurred vision and double vision.  Respiratory: Negative for cough, shortness of breath and wheezing.   Cardiovascular: Negative for chest pain, palpitations, orthopnea, claudication and leg swelling.  Gastrointestinal: Negative for abdominal pain, blood in stool, constipation, diarrhea, heartburn, melena, nausea and vomiting.  Genitourinary: Negative.   Musculoskeletal: Negative for joint pain and myalgias.  Skin: Negative for rash.  Neurological: Negative for dizziness, tingling, sensory change, weakness and headaches.  Endo/Heme/Allergies: Negative for polydipsia.  Psychiatric/Behavioral: Negative.   All other systems reviewed and are negative.   Physical Exam: There were no vitals taken for this visit. Wt  Readings from Last 3 Encounters:  04/27/19 212 lb 9.6 oz (96.4 kg)  03/23/19 210 lb 9.6 oz (95.5 kg)  12/23/18 206 lb (93.4 kg)   General Appearance: Well nourished, in no apparent distress. Eyes: PERRLA, EOMs, conjunctiva no swelling or erythema Sinuses: No Frontal/maxillary tenderness ENT/Mouth: Ext aud canals clear, TMs without erythema, bulging. No erythema, swelling, or exudate on post pharynx.  Tonsils not swollen or erythematous. Hearing normal.  Neck: Supple, thyroid normal.  Respiratory: Respiratory effort normal, BS equal bilaterally without rales,  rhonchi, wheezing or stridor.  Cardio: RRR with no MRGs. Brisk peripheral pulses without edema.  Abdomen: Soft, + BS.  Non tender, no guarding, rebound, hernias, masses. Lymphatics: Non tender without lymphadenopathy.  Musculoskeletal: Full ROM, 5/5 strength, Normal gait Skin: Warm, dry without rashes, lesions, ecchymosis.  Neuro: Cranial nerves intact. No cerebellar symptoms.  Psych: Awake and oriented X 3, normal affect, Insight and Judgment appropriate.    Izora Ribas, NP 12:30 PM [facility]

## 2019-06-25 ENCOUNTER — Ambulatory Visit: Payer: 59 | Admitting: Adult Health

## 2019-07-29 ENCOUNTER — Other Ambulatory Visit: Payer: Self-pay | Admitting: Internal Medicine

## 2019-07-29 DIAGNOSIS — G250 Essential tremor: Secondary | ICD-10-CM

## 2019-08-11 ENCOUNTER — Other Ambulatory Visit: Payer: Self-pay | Admitting: Internal Medicine

## 2019-09-24 DIAGNOSIS — IMO0001 Reserved for inherently not codable concepts without codable children: Secondary | ICD-10-CM | POA: Insufficient documentation

## 2019-09-24 NOTE — Progress Notes (Signed)
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   Reminder to go to the ER if any CP, SOB, nausea, dizziness, severe HA, changes vision/speech, left arm numbness and tingling and jaw pain.  Cholesterol Currently at goal; continue atorvastatin 80 mg daily Continue low cholesterol diet and exercise.  Check lipid panel.   Prediabetes Discussed disease and risks Discussed diet/exercise, weight management  A1C q60m  GERD Well managed on current medications; STOP alcohol  Discussed diet, avoiding triggers and other lifestyle changes  Overweight Long discussion about weight loss, diet, and exercise Recommended diet heavy in fruits and veggies and low in animal meats, cheeses, and dairy products, appropriate calorie intake Discussed ideal weight for height Will follow up in 3 months  Vitamin D Def At goal at last visit; continue supplementation to maintain goal of 70-100 Defer Vit D level  Anxiety Fairly managed by current regimen; suspect alcohol contributing;  Stress management techniques discussed, increase water, good sleep hygiene discussed, increase exercise, and increase veggies.   Gout Continue allopurinol;  Diet discussed Check uric acid as needed  Fatty liver/alcoholic liver disease  Weight loss advised, NEEDS TO STOP ALCOHOL, will monitor LFTs Est with Dr. Carlean Purl  Persistent proteinuria/microhematuria/microalbuminuria persistent x 3 years Saw Dr. 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 Secondary to alcohol;  switch propranolol ER 120 for compliance Strongly recommend alcohol reduction - Cautioned valium and risks with alcohol; knows not to take concurrently - ideally taper to d/c   Alcoholism 10+ daily, likely much higher, strong social element per patient since retirement Suspect contributing to tremors, anxiety, GERD/Barret's, diffuse liver disease with borderline hepatomegaly, nephropathy  Failed naltrexone x 2  He is pleasant and receptive but ambivalent about rehab; [facility] - given information to contact them should he change his mind.  Close follow up 3 months -   R cervical radiculopathy Intermittent, likely r/t R sided sleeping Discussed voltaren topical, get cervical pillow and barriers to encourage back sleeping Follow 12 weeks or sooner if worse, consider MRI/neuro if persistent   Continue diet and meds as discussed. Further disposition pending results of labs. Discussed med's effects and SE's.   Over 30 minutes of exam, counseling, chart review, and critical decision making was performed.   Future Appointments  Date Time Provider [place]  03/29/2020  9:00 AM Unk Pinto, MD [facility] None    ----------------------------------------------------------------------------------------------------------------------  HPI 54 y.o. male  presents for 3 month follow up on hypertension, cholesterol, prediabetes, GERD, anxiety, weight, vitamin D deficiency and alcohol abuse.   He has excess drinking, reports drinking daily in the last 3-4 year [place], 10+ daily (declines to quantify further), starts drinking at brunch at 10:30 and will drink throughout the day, drives around in golf cart, strong social element. Started having tremors about 4 years back, has noted bil hand tremors, will struggle to write, will occasionally drink to help the tremor. Propranolol 40 mg 3-4 times, only taking twice  due to out of the house, will switch to ER. He  has been prescribed valium TID PRN tremors, reports only taking at night, more for sleep. He has been prescribed naltrexone on several occasions, takes 1-2 doses but stops due to nausea and vomiting.   Twin brother is notably on hospice with alcoholic cirrhosis.  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 Discussed these are potentially caused by, certainly worsened by his alcoholism. He knows this is an issue, discussed may benefit from rehab, admits doesn't think he would seriously pursue this at this time.   he has a diagnosis of GERD/hx of Barrett's per EGD by Dr. Carlean Purl in 06/2018 recommended 3 year recall, family history gastric cancer, Currently managed by omeprazole 20 mg. Reports occasional dysphagia but denies reflux sx.   Also reports today will intermittently wake up with tingling/numbness of R hand and forearm, will resolve in 30 min or so. R sided sleeper. Some tightness of muscles in neck but not midline pain.   BMI is Body mass index is 28.59 kg/m., he has been working on diet but admits less exercise due to gyms closing.   Wt Readings from Last 3 Encounters:  09/25/19 205 lb (93 kg)  04/27/19 212 lb 9.6 oz (96.4 kg)  03/23/19 210 lb 9.6 oz (95.5 kg)   His blood pressure has been controlled at home, today their BP is BP: 122/84  He does workout. He denies chest pain, shortness of breath, dizziness.   He is on cholesterol medication (atorvastatin 40 mg daily) and denies myalgias. His cholesterol is not at goal. The cholesterol last visit was:   Lab Results  Component Value Date   CHOL 167 04/27/2019   HDL 60 04/27/2019   LDLCALC 85 04/27/2019   TRIG 122 04/27/2019   CHOLHDL 2.8 04/27/2019    He has not been working on diet and exercise for glucose management, and denies increased appetite, nausea, paresthesia  of the feet, polydipsia, polyuria, visual disturbances and vomiting. Last A1C in the office was:  Lab Results  Component Value Date   HGBA1C 6.1 (H) 04/27/2019   Patient is on Vitamin D supplement and at goal at recent check:    Lab Results  Component Value Date   VD25OH 72 04/27/2019     Patient is on allopurinol for gout without recent flares Lab Results  Component Value Date   LABURIC 4.3 04/27/2019    He has had persistent proteinuria, microscopic hematuria for several years, persistent microalbuminuria. He was referred to nephrology Dr. Carolynne Edouard in 2020, had evaluation with extensive lab workup and [place], felt nephropathy secondary to htn and analgesic, recommended ACE/ARB. Patient was on enalapril for several years, unclear why this was stopped, will restart today.   Component     Latest Ref Rng & Units 03/03/2018  Creatinine, Urine     20 - 320 mg/dL 70  Microalb, Ur     mg/dL 64.1  MICROALB/CREAT RATIO     <30 mcg/mg creat 916 (H)      Current Medications:  Current Outpatient Medications on File Prior to Visit  Medication Sig  . acyclovir (ZOVIRAX) 800 MG tablet TAKE 1 TABLET DAILY FOR FEVER BLISTERS (Patient taking differently: TAKE 1 TABLET DAILY FOR FEVER BLISTERS PRN)  . allopurinol (ZYLOPRIM) 300 MG tablet Take 1 tablet Daily to Prevent Gout  . atorvastatin (LIPITOR) 80 MG tablet Take 1 tablet Daily for Cholesterol  . buPROPion (WELLBUTRIN XL) 300 MG 24 hr tablet Take  1 tablet Daily for Mood, Focus & Concentration.  . Cholecalciferol (VITAMIN D3) 400 units CAPS Take 5,000 Units by mouth daily.   . diazepam (VALIUM) 5 MG tablet Take 1/2 to 1 tablet 3 x /day for Tremor  . loratadine-pseudoephedrine (CLARITIN-D 24 HOUR) 10-240 MG 24 hr tablet Take 1 tablet by mouth daily. (Patient taking differently: Take 1 tablet by mouth as needed. )  . omeprazole (PRILOSEC) 20 MG capsule TAKE 1 CAPSULE BY MOUTH ONCE DAILY BEFORE BREAKFAST.   No current facility-administered  medications on file prior to visit.     Allergies:  Allergies  Allergen Reactions  . Citalopram     dyphoria     Medical History:  Past Medical History:  Diagnosis Date  . Anxiety   . Barrett's esophagus 06/30/2018  . Encounter for long-term (current) use of other medications   . Hx of adenomatous colonic polyps 06/30/2018  . Hyperlipidemia   . Hypertension   . Hypogonadism male   . Vitamin D deficiency    Family history- Reviewed and unchanged Social history- Reviewed and unchanged    Review of Systems:  Review of Systems  Constitutional: Negative for malaise/fatigue and weight loss.  HENT: Negative for hearing loss and tinnitus.   Eyes: Negative for blurred vision and double vision.  Respiratory: Negative for cough, shortness of breath and wheezing.   Cardiovascular: Negative for chest pain, palpitations, orthopnea, claudication and leg swelling.  Gastrointestinal: Negative for abdominal pain, blood in stool, constipation, diarrhea, heartburn, melena, nausea and vomiting.  Genitourinary: Negative.   Musculoskeletal: Negative for joint pain and myalgias.  Skin: Negative for rash.  Neurological: Positive for tremors. Negative for dizziness, tingling, sensory change, weakness and headaches.  Endo/Heme/Allergies: Negative for polydipsia.  Psychiatric/Behavioral: Positive for substance abuse (10+ drinks daily ). Negative for depression and suicidal ideas. The patient is nervous/anxious.   All other systems reviewed and are negative.   Physical Exam: BP 122/84   Pulse 81   Temp (!) 97.3 F (36.3 C)   Ht 5\' 11"  (1.803 m)   Wt 205 lb (93 kg)   SpO2 98%   BMI 28.59 kg/m  Wt Readings from Last 3 Encounters:  09/25/19 205 lb (93 kg)  04/27/19 212 lb 9.6 oz (96.4 kg)  03/23/19 210 lb 9.6 oz (95.5 kg)   General Appearance: Well nourished, well dressed in no apparent distress. Eyes: PERRLA, EOMs, conjunctiva no swelling or erythema Sinuses: No Frontal/maxillary  tenderness ENT/Mouth: Ext aud canals clear, TMs without erythema, bulging. No erythema, swelling, or exudate on post pharynx.  Tonsils not swollen or erythematous. Hearing normal.  Neck: Supple, thyroid normal.  Respiratory: Respiratory effort normal, BS equal bilaterally without rales, rhonchi, wheezing or stridor.  Cardio: RRR with no MRGs. Brisk peripheral pulses without edema.  Abdomen: Soft, + BS.  Non tender, no guarding, rebound, hernias, masses. Lymphatics: Non tender without lymphadenopathy.  Musculoskeletal: Full ROM, 5/5 strength, Normal gait. normal range of motion, without spinous process tenderness, with paraspinal muscle tenderness both sides, normal sensation and pulses distal. ? Mildly diminished R brachial reflex.  Skin: Warm, dry without rashes, lesions, ecchymosis.  Neuro: Cranial nerves intact. No cerebellar symptoms.  Psych: Awake and oriented X 3, normal affect, Insight and Judgment appropriate.    Izora Ribas, NP 1:26 PM [facility]

## 2019-09-25 ENCOUNTER — Other Ambulatory Visit: Payer: Self-pay

## 2019-09-25 ENCOUNTER — Encounter: Payer: Self-pay | Admitting: Adult Health

## 2019-09-25 ENCOUNTER — Ambulatory Visit: Payer: 59 | Admitting: Adult Health

## 2019-09-25 VITALS — BP 122/84 | HR 81 | Temp 97.3°F | Ht 71.0 in | Wt 205.0 lb

## 2019-09-25 DIAGNOSIS — F418 Other specified anxiety disorders: Secondary | ICD-10-CM

## 2019-09-25 DIAGNOSIS — K219 Gastro-esophageal reflux disease without esophagitis: Secondary | ICD-10-CM

## 2019-09-25 DIAGNOSIS — Z79899 Other long term (current) drug therapy: Secondary | ICD-10-CM

## 2019-09-25 DIAGNOSIS — R7309 Other abnormal glucose: Secondary | ICD-10-CM

## 2019-09-25 DIAGNOSIS — I10 Essential (primary) hypertension: Secondary | ICD-10-CM | POA: Diagnosis not present

## 2019-09-25 DIAGNOSIS — R801 Persistent proteinuria, unspecified: Secondary | ICD-10-CM

## 2019-09-25 DIAGNOSIS — M5412 Radiculopathy, cervical region: Secondary | ICD-10-CM

## 2019-09-25 DIAGNOSIS — IMO0001 Reserved for inherently not codable concepts without codable children: Secondary | ICD-10-CM

## 2019-09-25 DIAGNOSIS — E663 Overweight: Secondary | ICD-10-CM

## 2019-09-25 DIAGNOSIS — R251 Tremor, unspecified: Secondary | ICD-10-CM

## 2019-09-25 DIAGNOSIS — E782 Mixed hyperlipidemia: Secondary | ICD-10-CM | POA: Diagnosis not present

## 2019-09-25 DIAGNOSIS — F1020 Alcohol dependence, uncomplicated: Secondary | ICD-10-CM

## 2019-09-25 DIAGNOSIS — M1A9XX0 Chronic gout, unspecified, without tophus (tophi): Secondary | ICD-10-CM

## 2019-09-25 DIAGNOSIS — E559 Vitamin D deficiency, unspecified: Secondary | ICD-10-CM

## 2019-09-25 MED ORDER — PROPRANOLOL HCL ER 120 MG PO CP24: 120.0000 mg | ORAL_CAPSULE | Freq: Every day | ORAL | 1 refills | Status: DC

## 2019-09-25 MED ORDER — ENALAPRIL MALEATE 20 MG PO TABS: ORAL_TABLET | ORAL | 1 refills | Status: DC

## 2019-09-25 NOTE — Patient Instructions (Addendum)
Goals     Blood Pressure < 130/80     Weight (lb) < 200 lb (90.7 kg)       [facility] recommend [facility] - have had several patients go through their program successfully - would likely help tremor, liver, kidney, anxiety, stomach/Barrett's - fixing this one thing will determine how your life plays out  .[place], [place] [address] Phone: [phone]    Voltaren gel on neck at night   Sleeping on back - pillows and barriers to keep you from rolling on side can help  Follow up if not improving in 12 weeks   Switch to propranolol ER 120 mg once daily   Enalapril - 1/2 tab first, then check BP in a few weeks, increase to whole tab if needed      Cervical Radiculopathy  Cervical radiculopathy happens when a nerve in the neck (a cervical nerve) is pinched or bruised. This condition can happen because of an injury to the cervical spine (vertebrae) in the neck, or as part of the normal aging process. Pressure on the cervical nerves can cause pain or numbness that travels from the neck all the way down into the arm and fingers. Usually, this condition gets better with rest. Treatment may be needed if the condition does not improve. What are the causes? This condition may be caused by:  A neck injury.  A bulging (herniated) disk.  Muscle spasms.  Muscle tightness in the neck because of overuse.  Arthritis.  Breakdown or degeneration in the bones and joints of the spine (spondylosis) due to aging.  Bone spurs that may develop near the cervical nerves. What are the signs or symptoms? Symptoms of this condition include:  Pain. The pain may travel from the neck to the arm and hand. The pain can be severe or irritating. It may be worse when you move your neck.  Numbness or tingling in your arm or hand.  Weakness in the affected arm and hand, in severe cases. How is this diagnosed? This condition may be diagnosed based on your symptoms, your medical  history, and a physical exam. You may also have tests, including:  X-rays.  A CT scan.  An MRI.  An electromyogram (EMG).  Nerve conduction tests. How is this treated? In many cases, treatment is not needed for this condition. With rest, the condition usually gets better over time. If treatment is needed, options may include:  Wearing a soft neck collar (cervical collar) for short periods of time, as told by your health care provider.  Doing physical therapy to strengthen your neck muscles.  Taking medicines, such as NSAIDs or oral corticosteroids.  Having spinal injections, in severe cases.  Having surgery. This may be needed if other treatments do not help. Different types of surgery may be done depending on the cause of this condition. Follow these instructions at home: If you have a cervical collar:  Wear it as told by your health care provider. Remove it only as told by your health care provider.  Ask your health care provider if you can remove the collar for cleaning and bathing. If you are allowed to remove the collar for cleaning or bathing: ? Follow instructions from your health care provider about how to remove the collar safely. ? Clean the collar by wiping it with mild soap and water and drying it completely. ? Take out any removable pads in the collar every 1-2 days, and wash them by hand with  soap and water. Let them air-dry completely before you put them back in the collar. ? Check your skin under the collar for irritation or sores. If you see any, tell your health care provider. Managing pain      Take over-the-counter and prescription medicines only as told by your health care provider.  If directed, put ice on the affected area. ? If you have a soft neck collar, remove it as told by your health care provider. ? Put ice in a plastic bag. ? Place a towel between your skin and the bag. ? Leave the ice on for 20 minutes, 2-3 times a day.  If applying ice  does not help, you can try using heat. Use the heat source that your health care provider recommends, such as a moist heat pack or a heating pad. ? Place a towel between your skin and the heat source. ? Leave the heat on for 20-30 minutes. ? Remove the heat if your skin turns bright red. This is especially important if you are unable to feel pain, heat, or cold. You may have a greater risk of getting burned.  Try a gentle neck and shoulder massage to help relieve symptoms. Activity  Rest as needed.  Return to your normal activities as told by your health care provider. Ask your health care provider what activities are safe for you.  Do stretching and strengthening exercises as told by your health care provider or physical therapist.  Do not lift anything that is heavier than 10 lb (4.5 kg) until your health care provider tells you that it is safe. General instructions  Use a flat pillow when you sleep.  Do not drive while wearing a cervical collar. If you do not have a cervical collar, ask your health care provider if it is safe to drive while your neck heals.  Ask your health care provider if the medicine prescribed to you requires you to avoid driving or using heavy machinery.  Do not use any products that contain nicotine or tobacco, such as cigarettes, e-cigarettes, and chewing tobacco. These can delay healing. If you need help quitting, ask your health care provider.  Keep all follow-up visits as told by your health care provider. This is important. Contact a health care provider if:  Your condition does not improve with treatment. Get help right away if:  Your pain gets much worse and cannot be controlled with medicines.  You have weakness or numbness in your hand, arm, face, or leg.  You have a high fever.  You have a stiff, rigid neck.  You lose control of your bowels or your bladder (have incontinence).  You have trouble with walking, balance, or  speaking. Summary  Cervical radiculopathy happens when a nerve in the neck is pinched or bruised.  A nerve can get pinched from a bulging disk, arthritis, muscle spasms, or an injury to the neck.  Symptoms include pain, tingling, or numbness radiating from the neck into the arm or hand. Weakness can also occur in severe cases.  Treatment may include rest, wearing a cervical collar, and physical therapy. Medicines may be prescribed to help with pain. In severe cases, injections or surgery may be needed. This information is not intended to replace advice given to you by your health care provider. Make sure you discuss any questions you have with your health care provider. Document Revised: 11/08/2017 Document Reviewed: 11/08/2017 Elsevier Patient Education  2020 [employer].       Alcohol  Abuse and Dependence Information, Adult Alcohol is a widely available drug. People drink alcohol in different amounts. People who drink alcohol very often and in large amounts often have problems during and after drinking. They may develop what is called an alcohol use disorder. There are two main types of alcohol use disorders:  Alcohol abuse. This is when you use alcohol too much or too often. You may use alcohol to make yourself feel happy or to reduce stress. You may have a hard time setting a limit on the amount you drink.  Alcohol dependence. This is when you use alcohol consistently for a period of time, and your body changes as a result. This can make it hard to stop drinking because you may start to feel sick or feel different when you do not use alcohol. These symptoms are known as withdrawal. How can alcohol abuse and dependence affect me? Alcohol abuse and dependence can have a negative effect on your life. Drinking too much can lead to addiction. You may feel like you need alcohol to function normally. You may drink alcohol before work in the morning, during the day, or as soon as you get home  from work in the evening. These actions can result in:  Poor work performance.  Job loss.  Financial problems.  Car crashes or criminal charges from driving after drinking alcohol.  Problems in your relationships with friends and family.  Losing the trust and respect of coworkers, friends, and family. Drinking heavily over a long period of time can permanently damage your body and brain, and can cause lifelong health issues, such as:  Damage to your liver or pancreas.  Heart problems, high blood pressure, or stroke.  Certain cancers.  Decreased ability to fight infections.  Brain or nerve damage.  Depression.  Early (premature) death. If you are careless or you crave alcohol, it is easy to drink more than your body can handle (overdose). Alcohol overdose is a serious situation that requires hospitalization. It may lead to permanent injuries or death. What can increase my risk?  Having a family history of alcohol abuse.  Having depression or other mental health conditions.  Beginning to drink at an early age.  Binge drinking often.  Experiencing trauma, stress, and an unstable home life during childhood.  Spending time with people who drink often. What actions can I take to prevent or manage alcohol abuse and dependence?  Do not drink alcohol if: ? Your health care provider tells you not to drink. ? You are pregnant, may be pregnant, or are planning to become pregnant.  If you drink alcohol: ? Limit how much you use to:  0-1 drink a day for women.  0-2 drinks a day for men. ? Be aware of how much alcohol is in your drink. In the [place] one drink equals one 12 oz bottle of beer (355 mL), one 5 oz glass of wine (148 mL), or one 1 oz glass of hard liquor (44 mL).  Stop drinking if you have been drinking too much. This can be very hard to do if you are used to abusing alcohol. If you begin to have withdrawal symptoms, talk with your health care provider or a person  that you trust. These symptoms may include anxiety, shaky hands, headache, nausea, sweating, or not being able to sleep.  Choose to drink nonalcoholic beverages in social gatherings and places where there may be alcohol. Activity  Spend more time on activities that you enjoy that do  not involve alcohol, like hobbies or exercise.  Find healthy ways to cope with stress, such as exercise, meditation, or spending time with people you care about. General information  Talk to your family, coworkers, and friends about supporting you in your efforts to stop drinking. If they drink, ask them not to drink around you. Spend more time with people who do not drink alcohol.  If you think that you have an alcohol dependency problem: ? Tell friends or family about your concerns. ? Talk with your health care provider or another health professional about where to get help. ? Work with a Transport planner and a Regulatory affairs officer. ? Consider joining a support group for people who struggle with alcohol abuse and dependence. Where to find support   Your health care provider.  SMART Recovery: www.smartrecovery.org Therapy and support groups  Local treatment centers or chemical dependency counselors.  Local AA groups in your community: [employer]Tax.com.pt Where to find more information  Centers for Disease Control and Prevention: http://www.wolf.info/  National Institute on Alcohol Abuse and Alcoholism: http://www.bradshaw.com/  Alcoholics Anonymous (AA): [employer].com.pt Contact a health care provider if:  You drank more or for longer than you intended on more than one occasion.  You tried to stop drinking or to cut back on how much you drink, but you were not able to.  You often drink to the point of vomiting or passing out.  You want to drink so badly that you cannot think about anything else.  You have problems in your life due to drinking, but you continue to drink.  You keep drinking even though you feel anxious,  depressed, or have experienced memory loss.  You have stopped doing the things you used to enjoy in order to drink.  You have to drink more than you used to in order to get the effect you want.  You experience anxiety, sweating, nausea, shakiness, and trouble sleeping when you try to stop drinking. Get help right away if:  You have thoughts about hurting yourself or others.  You have serious withdrawal symptoms, including: ? Confusion. ? Racing heart. ? High blood pressure. ? Fever. If you ever feel like you may hurt yourself or others, or have thoughts about taking your own life, get help right away. You can go to your nearest emergency department or call:  Your local emergency services (911 in the [place]  A suicide crisis helpline, such as the [place] at 819 642 3056. This is open 24 hours a day. Summary  Alcohol abuse and dependence can have a negative effect on your life. Drinking too much or too often can lead to addiction.  If you drink alcohol, limit how much you use.  If you are having trouble keeping your drinking under control, find ways to change your behavior. Hobbies, calming activities, exercise, or support groups can help.  If you feel you need help with changing your drinking habits, talk with your health care provider, a good friend, or a therapist, or go to an [place] group. This information is not intended to replace advice given to you by your health care provider. Make sure you discuss any questions you have with your health care provider. Document Revised: 04/08/2018 Document Reviewed: 02/25/2018 Elsevier Patient Education  [place].

## 2019-09-26 ENCOUNTER — Encounter: Payer: Self-pay | Admitting: Adult Health

## 2019-09-26 LAB — HEMOGLOBIN A1C
Hgb A1c MFr Bld: 5.6 % of total Hgb (ref <5.7)
Mean Plasma Glucose: 114 (calc)
eAG (mmol/L): 6.3 (calc)

## 2019-09-26 LAB — COMPLETE METABOLIC PANEL WITH GFR
AG Ratio: 1.3 (calc) (ref 1.0–2.5)
ALT: 79 U/L — ABNORMAL HIGH (ref 9–46)
AST: 92 U/L — ABNORMAL HIGH (ref 10–35)
Albumin: 4.4 g/dL (ref 3.6–5.1)
Alkaline phosphatase (APISO): 72 U/L (ref 35–144)
BUN: 15 mg/dL (ref 7–25)
CO2: 32 mmol/L (ref 20–32)
Calcium: 9.7 mg/dL (ref 8.6–10.3)
Chloride: 92 mmol/L — ABNORMAL LOW (ref 98–110)
Creat: 1.07 mg/dL (ref 0.70–1.33)
GFR, Est African American: 93 mL/min/{1.73_m2} (ref >=60)
GFR, Est Non African American: 80 mL/min/{1.73_m2} (ref >=60)
Globulin: 3.3 g/dL (calc) (ref 1.9–3.7)
Glucose, Bld: 106 mg/dL — ABNORMAL HIGH (ref 65–99)
Potassium: 4 mmol/L (ref 3.5–5.3)
Sodium: 137 mmol/L (ref 135–146)
Total Bilirubin: 1.4 mg/dL — ABNORMAL HIGH (ref 0.2–1.2)
Total Protein: 7.7 g/dL (ref 6.1–8.1)

## 2019-09-26 LAB — CBC WITH DIFFERENTIAL/PLATELET
Absolute Monocytes: 640 cells/uL (ref 200–950)
Basophils Absolute: 38 cells/uL (ref 0–200)
Basophils Relative: 0.6 %
Eosinophils Absolute: 218 cells/uL (ref 15–500)
Eosinophils Relative: 3.4 %
HCT: 43.2 % (ref 38.5–50.0)
Hemoglobin: 15.2 g/dL (ref 13.2–17.1)
Lymphs Abs: 1024 cells/uL (ref 850–3900)
MCH: 34.9 pg — ABNORMAL HIGH (ref 27.0–33.0)
MCHC: 35.2 g/dL (ref 32.0–36.0)
MCV: 99.1 fL (ref 80.0–100.0)
MPV: 10.4 fL (ref 7.5–12.5)
Monocytes Relative: 10 %
Neutro Abs: 4480 cells/uL (ref 1500–7800)
Neutrophils Relative %: 70 %
Platelets: 147 10*3/uL (ref 140–400)
RBC: 4.36 10*6/uL (ref 4.20–5.80)
RDW: 13 % (ref 11.0–15.0)
Total Lymphocyte: 16 %
WBC: 6.4 10*3/uL (ref 3.8–10.8)

## 2019-09-26 LAB — LIPID PANEL
Cholesterol: 160 mg/dL (ref <200)
HDL: 40 mg/dL (ref >=40)
LDL Cholesterol (Calc): 97 mg/dL (calc)
Non-HDL Cholesterol (Calc): 120 mg/dL (calc) (ref <130)
Total CHOL/HDL Ratio: 4 (calc) (ref <5.0)
Triglycerides: 133 mg/dL (ref <150)

## 2019-09-26 LAB — TSH: TSH: 2.1 mIU/L (ref 0.40–4.50)

## 2019-09-26 LAB — MAGNESIUM: Magnesium: 1.4 mg/dL — ABNORMAL LOW (ref 1.5–2.5)

## 2019-12-10 ENCOUNTER — Other Ambulatory Visit: Payer: Self-pay | Admitting: Internal Medicine

## 2019-12-10 DIAGNOSIS — M1A9XX0 Chronic gout, unspecified, without tophus (tophi): Secondary | ICD-10-CM

## 2020-01-11 ENCOUNTER — Ambulatory Visit: Payer: 59 | Admitting: Adult Health

## 2020-02-12 ENCOUNTER — Other Ambulatory Visit: Payer: Self-pay | Admitting: Internal Medicine

## 2020-02-12 DIAGNOSIS — K120 Recurrent oral aphthae: Secondary | ICD-10-CM

## 2020-02-15 ENCOUNTER — Other Ambulatory Visit: Payer: Self-pay | Admitting: Internal Medicine

## 2020-02-15 MED ORDER — DEXAMETHASONE 4 MG PO TABS: ORAL_TABLET | ORAL | 0 refills | Status: DC

## 2020-03-23 ENCOUNTER — Other Ambulatory Visit: Payer: Self-pay | Admitting: Adult Health

## 2020-03-27 ENCOUNTER — Encounter: Payer: Self-pay | Admitting: Internal Medicine

## 2020-03-27 NOTE — Progress Notes (Addendum)
Annual  Screening/Preventative Visit  & Comprehensive Evaluation & Examination                                             This very nice 55 y.o. DWM presents for a Screening /Preventative Visit & comprehensive evaluation and management of multiple medical co-morbidities.  Patient has been followed for HTN, HLD, Prediabetes and Vitamin D Deficiency.  Patient has hx/o Gout controlled with his meds . He also has hx/o Alcoholism and has been quite resistant to stop  Alcohol or take recommended Naltrexone. Other problems include Hereditary Tremor for which he takes  Propranolol & Diazepam.       HTN predates since 2006. Patient's BP has been controlled at home.  Today's BP: 136/88. Patient denies any cardiac symptoms as chest pain, palpitations, shortness of breath, dizziness or ankle swelling.      Patient's hyperlipidemia is controlled with diet and Rosuvastatin. Patient denies myalgias or other medication SE's. Last lipids were at goal:  Lab Results  Component Value Date   CHOL 160 09/25/2019   HDL 40 09/25/2019   LDLCALC 97 09/25/2019   TRIG 133 09/25/2019   CHOLHDL 4.0 09/25/2019        Patient is monitored expectantly for glucose intolerance  and patient denies reactive hypoglycemic symptoms, visual blurring, diabetic polys or paresthesias. Last A1c was normal & at goal:    Lab Results  Component Value Date   HGBA1C 5.6 09/25/2019         Finally, patient has history of Vitamin D Deficiency ("25" /2008) and last vitamin D was at goal:    Lab Results  Component Value Date   VD25OH 72 04/27/2019    Current Outpatient Medications on File Prior to Visit  Medication Sig   acyclovir (ZOVIRAX) 800 MG tablet TAKE 1 TABLET DAILY FOR FEVER BLISTERS PRN   allopurinol (ZYLOPRIM) 300 MG tablet Take     1 tablet     Daily       to Prevent Gout   atorvastatin (LIPITOR) 80 MG tablet Take 1 tablet Daily for Cholesterol   buPROPion (WELLBUTRIN XL) 300 MG 24 hr tablet Take 1 tablet  Daily for Mood, Focus & Concentration.   Cholecalciferol (VITAMIN D3) 400 units CAPS Take 5,000 Units by mouth daily.    diazepam (VALIUM) 5 MG tablet Take 1/2 to 1 tablet 3 x /day for Tremor   enalapril (VASOTEC) 20 MG tablet TAKE 1/2- 1 TABLET BY MOUTH EVERY DAY FOR BLOOD PRESSURE AND KIDNEY PROTECTION.   loratadine-pseudoephedrine (CLARITIN-D 24 HOUR) 10-240 MG 24 hr tablet Take 1 tablet by mouth daily. (Patient taking differently: Take 1 tablet by mouth as needed. )   omeprazole (PRILOSEC) 20 MG capsule TAKE 1 CAPSULE BY MOUTH ONCE DAILY BEFORE BREAKFAST.   propranolol ER (INDERAL LA) 120 MG 24 hr capsule Take 1 capsule (120 mg total) by mouth daily.     Allergies  Allergen Reactions   Citalopram     dyphoria    Past Medical History:  Diagnosis Date   Anxiety    Barrett's esophagus 06/30/2018   Encounter for long-term (current) use of other medications    Hx of adenomatous colonic polyps 06/30/2018   Hyperlipidemia    Hypertension    Hypogonadism male    Vitamin D deficiency     Health Maintenance  Topic Date Due  INFLUENZA VACCINE  08/02/2019   COVID-19 Vaccine (3 - Booster for Pfizer series) 11/04/2019   COLONOSCOPY  06/24/2021   TETANUS/TDAP  07/31/2025   Hepatitis C Screening  Completed   HIV Screening  Completed   HPV VACCINES  Aged Out    Immunization History  Administered Date(s) Administered   Influenza Inj Mdck Quad  09/30/2017   Influenza-Unspecified 10/07/2012   PFIZERSARS-COV-2 Vacc 04/13/2019, 05/04/2019   PPD Test 01/12/2014, 08/01/2015, 09/10/2016   Pneumococcal-Unspecified 02/11/2008   Td 12/05/2005   Tdap 08/01/2015    Last Colon - 06/25/2018 - Dr Carlean Purl - 5 Adenomas - Recc 3 yr f/u due July 2023  Last EGD  - 06.24.2020 -  Dr Carlean Purl - short segment Barrett's - Recc 3 yr f/u - due July 2023  Past Surgical History:  Procedure Laterality Date   HERNIA REPAIR     I & D EXTREMITY Right 06/19/2012   Procedure: IRRIGATION AND DEBRIDEMENT  FOREARM;  Surgeon: Tennis Must, MD;  Location: [facility];  Service: Orthopedics;  Laterality: Right;   KNEE ARTHROSCOPY WITH ANTERIOR CRUCIATE LIGAMENT (ACL) REPAIR Left Jan 2017   Dr. Ballard Russell   LASIK  2006   VASECTOMY      Family History  Problem Relation Age of Onset   Cancer Mother        Stomach, lymphoma, melanoma   Diabetes Mother    Heart disease Mother    Stomach cancer Maternal Grandmother    Colon cancer Neg Hx    Esophageal cancer Neg Hx     Social History   Socioeconomic History   Marital status: Divorced   Number of children: None  Occupational History   Retired   Tobacco Use   Smoking status: Never Smoker   Smokeless tobacco: Never Used  Scientific laboratory technician Use: Never used  Substance and Sexual Activity   Alcohol use: Yes   Drug use: No   Sexual activity: Yes  Social History Narrative   Divorced   Optician, 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 also     ROS Constitutional: Denies fever, chills, weight loss/gain, headaches, insomnia,  night sweats or change in appetite. Does c/o fatigue. Eyes: Denies redness, blurred vision, diplopia, discharge, itchy or watery eyes.  ENT: Denies discharge, congestion, post nasal drip, epistaxis, sore throat, earache, hearing loss, dental pain, Tinnitus, Vertigo, Sinus pain or snoring.  Cardio: Denies chest pain, palpitations, irregular heartbeat, syncope, dyspnea, diaphoresis, orthopnea, PND, claudication or edema Respiratory: denies cough, dyspnea, DOE, pleurisy, hoarseness, laryngitis or wheezing.  Gastrointestinal: Denies dysphagia, heartburn, reflux, water brash, pain, cramps, nausea, vomiting, bloating, diarrhea, constipation, hematemesis, melena, hematochezia, jaundice or hemorrhoids Genitourinary: Denies dysuria, frequency, urgency, nocturia, hesitancy, discharge, hematuria or flank pain Musculoskeletal: Denies arthralgia, myalgia,  stiffness, Jt. Swelling, pain, limp or strain/sprain. Denies Falls. Skin: Denies puritis, rash, hives, warts, acne, eczema or change in skin lesion Neuro: No weakness, tremor, incoordination, spasms, paresthesia or pain Psychiatric: Denies confusion, memory loss or sensory loss. Denies Depression. Endocrine: Denies change in weight, skin, hair change, nocturia, and paresthesia, diabetic polys, visual blurring or hyper / hypo glycemic episodes.  Heme/Lymph: No excessive bleeding, bruising or enlarged lymph nodes.  Physical Exam  BP 136/88   Pulse 80   Temp 97.9 F (36.6 C)   Resp 16   Ht 5\' 11"  (1.803 m)   Wt 204 lb 9.6 oz (92.8 kg)  SpO2 99%   BMI 28.54 kg/m   General Appearance: Well nourished and well groomed and in no apparent distress.  Eyes: PERRLA, EOMs, conjunctiva no swelling or erythema, normal fundi and vessels. Sinuses: No frontal/maxillary tenderness ENT/Mouth: EACs patent / TMs  nl. Nares clear without erythema, swelling, mucoid exudates. Oral hygiene is good. No erythema, swelling, or exudate. Tongue normal, non-obstructing. Tonsils not swollen or erythematous. Hearing normal.  Neck: Supple, thyroid not palpable. No bruits, nodes or JVD. Respiratory: Respiratory effort normal.  BS equal and clear bilateral without rales, rhonci, wheezing or stridor. Cardio: Heart sounds are normal with regular rate and rhythm and no murmurs, rubs or gallops. Peripheral pulses are normal and equal bilaterally without edema. No aortic or femoral bruits. Chest: symmetric with normal excursions and percussion.  Abdomen: Soft, with Nl bowel sounds. Nontender, no guarding, rebound, hernias, masses, or organomegaly.  Lymphatics: Non tender without lymphadenopathy.  Musculoskeletal: Full ROM all peripheral extremities, joint stability, 5/5 strength, and normal gait. Skin: Warm and dry without rashes, lesions, cyanosis, clubbing or  ecchymosis.  Neuro: Cranial nerves intact, reflexes equal  bilaterally. Normal muscle tone, no cerebellar symptoms. Sensation intact.  Pysch: Alert and oriented X 3 with normal affect, insight and judgment appropriate.   Assessment and Plan  1. Annual Preventative/Screening Exam    2. Essential hypertension  - EKG 12-Lead - [place], RETROPERITNL ABD,  LTD - Urinalysis, Routine w reflex microscopic - Microalbumin / creatinine urine ratio - CBC with Differential/Platelet - COMPLETE METABOLIC PANEL WITH GFR - Magnesium - TSH  3. Hyperlipidemia, mixed  - EKG 12-Lead - [place], RETROPERITNL ABD,  LTD - Lipid panel - TSH  4. Abnormal glucose  - EKG 12-Lead - [place], RETROPERITNL ABD,  LTD - Hemoglobin A1c - Insulin, random  5. Vitamin D deficiency  - VITAMIN D 25 Hydroxy  6. Gastroesophageal reflux disease,  - CBC with Differential/Platelet  7. Chronic gout  - Uric acid  8. Hereditary essential tremor   9. Prostate cancer screening  - PSA  10. Screening examination for pulmonary tuberculosis  - TB Skin Test  11. Screening for colorectal cancer  - POC Hemoccult Bld/Stl   12. Screening for ischemic heart disease  - EKG 12-Lead  13. FHx: heart disease  - EKG 12-Lead - [place], RETROPERITNL ABD,  LTD - Iron, Total/Total Iron Binding Cap - Vitamin B12  14. Screening for AAA (aortic abdominal aneurysm) - [place], RETROPERITNL ABD,  LTD  15. Fatigue  - Testosterone  16. Medication management  - Urinalysis, Routine w reflex microscopic - Microalbumin / creatinine urine ratio - Uric acid - CBC with Differential/Platelet - COMPLETE METABOLIC PANEL WITH GFR - Magnesium - Lipid panel - TSH - Hemoglobin A1c - Insulin, random - VITAMIN D 25 Hydroxy         Patient was counseled in prudent diet, weight control to achieve/maintain BMI less than 25, BP monitoring, regular exercise and medications as discussed.  Discussed med effects and SE's. Routine screening labs and tests as requested with regular follow-up as recommended. Over  40 minutes of exam, counseling, chart review and high complex critical decision making was performed   Kirtland Bouchard, MD

## 2020-03-27 NOTE — Patient Instructions (Signed)

## 2020-03-29 ENCOUNTER — Other Ambulatory Visit: Payer: Self-pay

## 2020-03-29 ENCOUNTER — Ambulatory Visit (INDEPENDENT_AMBULATORY_CARE_PROVIDER_SITE_OTHER): Payer: No Typology Code available for payment source | Admitting: Internal Medicine

## 2020-03-29 VITALS — BP 136/88 | HR 80 | Temp 97.9°F | Resp 16 | Ht 71.0 in | Wt 204.6 lb

## 2020-03-29 DIAGNOSIS — G250 Essential tremor: Secondary | ICD-10-CM

## 2020-03-29 DIAGNOSIS — Z0000 Encounter for general adult medical examination without abnormal findings: Secondary | ICD-10-CM | POA: Diagnosis not present

## 2020-03-29 DIAGNOSIS — Z8249 Family history of ischemic heart disease and other diseases of the circulatory system: Secondary | ICD-10-CM | POA: Diagnosis not present

## 2020-03-29 DIAGNOSIS — R5383 Other fatigue: Secondary | ICD-10-CM

## 2020-03-29 DIAGNOSIS — E782 Mixed hyperlipidemia: Secondary | ICD-10-CM

## 2020-03-29 DIAGNOSIS — Z1211 Encounter for screening for malignant neoplasm of colon: Secondary | ICD-10-CM

## 2020-03-29 DIAGNOSIS — M1A9XX0 Chronic gout, unspecified, without tophus (tophi): Secondary | ICD-10-CM

## 2020-03-29 DIAGNOSIS — Z136 Encounter for screening for cardiovascular disorders: Secondary | ICD-10-CM

## 2020-03-29 DIAGNOSIS — Z125 Encounter for screening for malignant neoplasm of prostate: Secondary | ICD-10-CM

## 2020-03-29 DIAGNOSIS — I10 Essential (primary) hypertension: Secondary | ICD-10-CM | POA: Diagnosis not present

## 2020-03-29 DIAGNOSIS — Z111 Encounter for screening for respiratory tuberculosis: Secondary | ICD-10-CM | POA: Diagnosis not present

## 2020-03-29 DIAGNOSIS — Z79899 Other long term (current) drug therapy: Secondary | ICD-10-CM

## 2020-03-29 DIAGNOSIS — E559 Vitamin D deficiency, unspecified: Secondary | ICD-10-CM

## 2020-03-29 DIAGNOSIS — F1020 Alcohol dependence, uncomplicated: Secondary | ICD-10-CM

## 2020-03-29 DIAGNOSIS — K219 Gastro-esophageal reflux disease without esophagitis: Secondary | ICD-10-CM

## 2020-03-29 DIAGNOSIS — Z0001 Encounter for general adult medical examination with abnormal findings: Secondary | ICD-10-CM

## 2020-03-29 DIAGNOSIS — R7309 Other abnormal glucose: Secondary | ICD-10-CM

## 2020-03-29 MED ORDER — OLMESARTAN MEDOXOMIL 40 MG PO TABS: ORAL_TABLET | ORAL | 3 refills | Status: DC

## 2020-03-29 MED ORDER — NADOLOL 80 MG PO TABS: ORAL_TABLET | ORAL | 3 refills | Status: DC

## 2020-03-29 MED ORDER — NALTREXONE HCL 50 MG PO TABS: ORAL_TABLET | ORAL | 1 refills | Status: AC

## 2020-03-29 NOTE — Progress Notes (Signed)
AortaScan < 3 cm. Within normal limts, per Dr Melford Aase.

## 2020-03-30 LAB — LIPID PANEL
Cholesterol: 189 mg/dL (ref <200)
HDL: 64 mg/dL (ref >=40)
LDL Cholesterol (Calc): 103 mg/dL (calc) — ABNORMAL HIGH
Non-HDL Cholesterol (Calc): 125 mg/dL (calc) (ref <130)
Total CHOL/HDL Ratio: 3 (calc) (ref <5.0)
Triglycerides: 127 mg/dL (ref <150)

## 2020-03-30 LAB — VITAMIN D 25 HYDROXY (VIT D DEFICIENCY, FRACTURES): Vit D, 25-Hydroxy: 85 ng/mL (ref 30–100)

## 2020-03-30 LAB — CBC WITH DIFFERENTIAL/PLATELET
Absolute Monocytes: 573 cells/uL (ref 200–950)
Basophils Absolute: 18 cells/uL (ref 0–200)
Basophils Relative: 0.3 %
Eosinophils Absolute: 153 cells/uL (ref 15–500)
Eosinophils Relative: 2.5 %
HCT: 44.3 % (ref 38.5–50.0)
Hemoglobin: 15.4 g/dL (ref 13.2–17.1)
Lymphs Abs: 1104 cells/uL (ref 850–3900)
MCH: 33.8 pg — ABNORMAL HIGH (ref 27.0–33.0)
MCHC: 34.8 g/dL (ref 32.0–36.0)
MCV: 97.1 fL (ref 80.0–100.0)
MPV: 10.5 fL (ref 7.5–12.5)
Monocytes Relative: 9.4 %
Neutro Abs: 4252 cells/uL (ref 1500–7800)
Neutrophils Relative %: 69.7 %
Platelets: 152 10*3/uL (ref 140–400)
RBC: 4.56 10*6/uL (ref 4.20–5.80)
RDW: 13.9 % (ref 11.0–15.0)
Total Lymphocyte: 18.1 %
WBC: 6.1 10*3/uL (ref 3.8–10.8)

## 2020-03-30 LAB — MICROALBUMIN / CREATININE URINE RATIO
Creatinine, Urine: 131 mg/dL (ref 20–320)
Microalb Creat Ratio: 289 mcg/mg creat — ABNORMAL HIGH (ref <30)
Microalb, Ur: 37.9 mg/dL

## 2020-03-30 LAB — COMPLETE METABOLIC PANEL WITH GFR
AG Ratio: 1.6 (calc) (ref 1.0–2.5)
ALT: 107 U/L — ABNORMAL HIGH (ref 9–46)
AST: 87 U/L — ABNORMAL HIGH (ref 10–35)
Albumin: 4.3 g/dL (ref 3.6–5.1)
Alkaline phosphatase (APISO): 67 U/L (ref 35–144)
BUN: 17 mg/dL (ref 7–25)
CO2: 34 mmol/L — ABNORMAL HIGH (ref 20–32)
Calcium: 9.6 mg/dL (ref 8.6–10.3)
Chloride: 97 mmol/L — ABNORMAL LOW (ref 98–110)
Creat: 0.99 mg/dL (ref 0.70–1.33)
GFR, Est African American: 101 mL/min/{1.73_m2} (ref >=60)
GFR, Est Non African American: 87 mL/min/{1.73_m2} (ref >=60)
Globulin: 2.7 g/dL (calc) (ref 1.9–3.7)
Glucose, Bld: 100 mg/dL — ABNORMAL HIGH (ref 65–99)
Potassium: 3.6 mmol/L (ref 3.5–5.3)
Sodium: 141 mmol/L (ref 135–146)
Total Bilirubin: 1.1 mg/dL (ref 0.2–1.2)
Total Protein: 7 g/dL (ref 6.1–8.1)

## 2020-03-30 LAB — URINALYSIS, ROUTINE W REFLEX MICROSCOPIC
Bacteria, UA: NONE SEEN /HPF
Bilirubin Urine: NEGATIVE
Glucose, UA: NEGATIVE
Hyaline Cast: NONE SEEN /LPF
Ketones, ur: NEGATIVE
Nitrite: NEGATIVE
Specific Gravity, Urine: 1.016 (ref 1.001–1.03)
Squamous Epithelial / HPF: NONE SEEN /HPF (ref <=5)
pH: 6 (ref 5.0–8.0)

## 2020-03-30 LAB — VITAMIN B12: Vitamin B-12: 498 pg/mL (ref 200–1100)

## 2020-03-30 LAB — PSA: PSA: 4.47 ng/mL — ABNORMAL HIGH (ref <=4.0)

## 2020-03-30 LAB — TESTOSTERONE: Testosterone: 452 ng/dL (ref 250–827)

## 2020-03-30 LAB — MICROSCOPIC MESSAGE

## 2020-03-30 LAB — IRON, TOTAL/TOTAL IRON BINDING CAP
%SAT: 26 % (calc) (ref 20–48)
Iron: 87 ug/dL (ref 50–180)
TIBC: 331 mcg/dL (calc) (ref 250–425)

## 2020-03-30 LAB — HEMOGLOBIN A1C
Hgb A1c MFr Bld: 5.5 % of total Hgb (ref <5.7)
Mean Plasma Glucose: 111 mg/dL
eAG (mmol/L): 6.2 mmol/L

## 2020-03-30 LAB — URIC ACID: Uric Acid, Serum: 5.1 mg/dL (ref 4.0–8.0)

## 2020-03-30 LAB — TSH: TSH: 1.8 mIU/L (ref 0.40–4.50)

## 2020-03-30 LAB — INSULIN, RANDOM: Insulin: 16.1 u[IU]/mL

## 2020-03-30 LAB — MAGNESIUM: Magnesium: 1.4 mg/dL — ABNORMAL LOW (ref 1.5–2.5)

## 2020-03-30 NOTE — Progress Notes (Signed)
============================================================ -   Test results slightly outside the reference range are not unusual. If there is anything important, I will review this with you,  otherwise it is considered normal test values.  If you have further questions,  please do not hesitate to contact me at the office or via My Chart.  ============================================================ ============================================================  -  U/A still shows trace blood - continue to monitor.  ============================================================ ============================================================  -  Vitamin B12 - Normal - Great  ! ============================================================ ============================================================  -  Iron levels - Normal  ============================================================ ============================================================  -  PSA is up slightly - suggest se a Urologist, especially since blood in Urine   - Would you prefer to see Urologist in [place] or [place] [employer] ? ============================================================ ============================================================  -  Testosterone - Normal  ============================================================ ============================================================  -  Uric acid  /Gout test Normal -Continue Allopurinol ============================================================ ============================================================  -  Liver enzymes are still elevated form alcohol                                                    - VERY  Important to stop drinking Alcohol ============================================================ ============================================================  -  Total Chol = 189  -  Excellent - at goal   - Very low risk for Heart Attack  /  Stroke ======================================================== - but   - Bad /Dangerous LDL Chol = 103 - borderline elevated       (Ideal or Goal is less than 70  !  )   - Recommend  a stricter low cholesterol diet   - Cholesterol only comes from animal sources  - ie. meat, dairy, egg yolks  - Eat all the vegetables you want.  - Avoid meat, especially red meat - Beef AND Pork .  - Avoid cheese & dairy - milk & ice cream.     - Cheese is the most concentrated form of trans-fats which  is the worst thing to clog up our arteries.   - Veggie cheese is OK which can be found in the fresh  produce section at [employer] or [employer] or [employer] ============================================================ ============================================================  -  A1c -  Normal - Great - No Diabetes  !   ============================================================ ============================================================  -  Vitamin D = 85 - Excellent  ============================================================ ============================================================  -  All Else - CBC - Kidneys - Electrolytes - Liver - Magnesium & Thyroid    - all  Normal / OK ============================================================ ============================================================

## 2020-04-02 ENCOUNTER — Encounter: Payer: Self-pay | Admitting: Internal Medicine

## 2020-05-29 ENCOUNTER — Other Ambulatory Visit: Payer: Self-pay | Admitting: Internal Medicine

## 2020-05-29 DIAGNOSIS — F320 Major depressive disorder, single episode, mild: Secondary | ICD-10-CM

## 2020-09-03 ENCOUNTER — Other Ambulatory Visit: Payer: Self-pay | Admitting: Adult Health

## 2020-09-03 ENCOUNTER — Other Ambulatory Visit: Payer: Self-pay | Admitting: Internal Medicine

## 2020-09-03 DIAGNOSIS — K120 Recurrent oral aphthae: Secondary | ICD-10-CM

## 2020-10-05 ENCOUNTER — Other Ambulatory Visit: Payer: Self-pay | Admitting: Internal Medicine

## 2020-10-05 DIAGNOSIS — G250 Essential tremor: Secondary | ICD-10-CM

## 2020-10-05 MED ORDER — OMEPRAZOLE 40 MG PO CPDR: DELAYED_RELEASE_CAPSULE | ORAL | 3 refills | Status: DC

## 2020-10-05 MED ORDER — DIAZEPAM 5 MG PO TABS: ORAL_TABLET | ORAL | 1 refills | Status: AC

## 2020-10-09 ENCOUNTER — Encounter: Payer: Self-pay | Admitting: Internal Medicine

## 2020-10-09 NOTE — Patient Instructions (Addendum)

## 2020-10-09 NOTE — Progress Notes (Addendum)
Future Appointments  Date Time Provider [place]  10/10/2020  9:30 AM Unk Pinto, MD [facility] None  04/11/2021  3:00 PM Unk Pinto, MD [facility] None    History of Present Illness:       This very nice 55 y.o. DWM presents for 6  month follow up with HTN, HLD, Pre-Diabetes and Vitamin D Deficiency. Patient has Gout controlled on his Allopurinol.   Patient is on Propranolol & Diazepam for Hereditary Tremor. He also has hx/o Alcoholism and has been  reticent  to stop  Alcohol or take recommended Naltrexone. Today he relates reason for continuing drinking is "anxiety" . He indicates willingness to try Lexapro for chronic anxiety.       Patient is treated for HTN (2006) & BP has been controlled at home. Today's  . Patient has had no complaints of any cardiac type chest pain, palpitations, dyspnea / orthopnea / PND, dizziness, claudication, or dependent edema.       Hyperlipidemia is controlled with diet /Rosuvastatin. Patient denies myalgias or other med SE's. Last Lipids were near goal:  Lab Results  Component Value Date   CHOL 189 03/29/2020   HDL 64 03/29/2020   LDLCALC 103 (H) 03/29/2020   TRIG 127 03/29/2020   CHOLHDL 3.0 03/29/2020     Also, the patient  is monitored for  prediabetes and has had no symptoms of reactive hypoglycemia, diabetic polys, paresthesias or visual blurring.  Last A1c was normal & at goal:  Lab Results  Component Value Date   HGBA1C 5.5 03/29/2020                                                          Further, the patient also has history of Vitamin D Deficiency ("25" /2008) and supplements vitamin D without any suspected side-effects. Last vitamin D was at goal:  Lab Results  Component Value Date   VD25OH 85 03/29/2020     Current Outpatient Medications on File Prior to Visit  Medication Sig   acyclovir  800 MG tablet TAKE 1 TABLET DAILY FOR FEVER BLISTERS AS NEEDED   allopurinol 300 MG tablet Take 1 tablet   Daily    VITAMIN C ADULT GUMMIES  1 gummy daily   atorvastatin (LIPITOR) 80 MG tablet Take 1 tablet Daily for Cholesterol   buPROPion - XL 300 MG  TAKE 1 TABLET DAILY    VITAMIN D 5,000 Units  Take daily.    diazepam (VALIUM) 5 MG tablet Take 1 tablet  3 x /day for Tremor   loratadine-pseudoephedrine  10-240 MG 24 hr Take 1 tablet  daily as needed.)   MAGNESIUM  Take 1 tablet  daily.   nadolol (CORGARD) 80 MG tablet Take  1/2 to 1 tablet  Daily  for BP & Tremor   naltrexone (DEPADE) 50 MG tablet Take  1 tablet  4 x /day  at Meals & Bedtime for "cravings"   olmesartan (BENICAR) 40 MG tablet Take  1/2 to 1 tablet  at Bedtime  for BP   omeprazole (PRILOSEC) 40 MG capsule Take  1 capsule  Daily    zinc 50 MG tablet Take  daily.    Allergies  Allergen Reactions   Citalopram     dyphoria  PMHx:   Past Medical History:  Diagnosis Date   Anxiety    Barrett's esophagus 06/30/2018   Hx of adenomatous colonic polyps 06/30/2018   Hyperlipidemia    Hypertension    Hypogonadism male    Vitamin D deficiency      Immunization History  Administered Date(s) Administered   Influenza Inj Mdck Quad  09/30/2017   Influenza 10/07/2012   PFIZER SARS-COV-2 Vacc 04/13/2019, 05/04/2019   PPD Test 01/12/2014, 08/01/2015, 09/10/2016, 03/29/2020   Pneumococcal-23 02/11/2008   Td 12/05/2005   Tdap 08/01/2015     Past Surgical History:  Procedure Laterality Date   HERNIA REPAIR     I & D EXTREMITY Right 06/19/2012   Procedure: IRRIGATION AND DEBRIDEMENT FOREARM;  Surgeon: Tennis Must, MD;  Location: [facility];  Service: Orthopedics;  Laterality: Right;   KNEE ARTHROSCOPY WITH ANTERIOR CRUCIATE LIGAMENT (ACL) REPAIR Left Jan 2017   Dr. Ballard Russell   LASIK  2006   VASECTOMY      FHx:    Reviewed / unchanged  SHx:    Reviewed / unchanged   Systems Review:  Constitutional: Denies fever, chills, wt changes, headaches, insomnia, fatigue, night sweats, change in appetite. Eyes: Denies redness,  blurred vision, diplopia, discharge, itchy, watery eyes.  ENT: Denies discharge, congestion, post nasal drip, epistaxis, sore throat, earache, hearing loss, dental pain, tinnitus, vertigo, sinus pain, snoring.  CV: Denies chest pain, palpitations, irregular heartbeat, syncope, dyspnea, diaphoresis, orthopnea, PND, claudication or edema. Respiratory: denies cough, dyspnea, DOE, pleurisy, hoarseness, laryngitis, wheezing.  Gastrointestinal: Denies dysphagia, odynophagia, heartburn, reflux, water brash, abdominal pain or cramps, nausea, vomiting, bloating, diarrhea, constipation, hematemesis, melena, hematochezia  or hemorrhoids. Genitourinary: Denies dysuria, frequency, urgency, nocturia, hesitancy, discharge, hematuria or flank pain. Musculoskeletal: Denies arthralgias, myalgias, stiffness, jt. swelling, pain, limping or strain/sprain.  Skin: Denies pruritus, rash, hives, warts, acne, eczema or change in skin lesion(s). Neuro: No weakness, tremor, incoordination, spasms, paresthesia or pain. Psychiatric: Denies confusion, memory loss or sensory loss. Endo: Denies change in weight, skin or hair change.  Heme/Lymph: No excessive bleeding, bruising or enlarged lymph nodes.  Physical Exam  There were no vitals taken for this visit.  Appears  well nourished, well groomed  and in no distress.  Eyes: PERRLA, EOMs, conjunctiva no swelling or erythema. Sinuses: No frontal/maxillary tenderness ENT/Mouth: EAC's clear, TM's nl w/o erythema, bulging. Nares clear w/o erythema, swelling, exudates. Oropharynx clear without erythema or exudates. Oral hygiene is good. Tongue normal, non obstructing. Hearing intact.  Neck: Supple. Thyroid not palpable. Car 2+/2+ without bruits, nodes or JVD. Chest: Respirations nl with BS clear & equal w/o rales, rhonchi, wheezing or stridor.  Cor: Heart sounds normal w/ regular rate and rhythm without sig. murmurs, gallops, clicks or rubs. Peripheral pulses normal and equal   without edema.  Abdomen: Soft & bowel sounds normal. Non-tender w/o guarding, rebound, hernias, masses or organomegaly.  Lymphatics: Unremarkable.  Musculoskeletal: Full ROM all peripheral extremities, joint stability, 5/5 strength and normal gait.  Skin: Warm, dry without exposed rashes, lesions or ecchymosis apparent.  Neuro: Cranial nerves intact, reflexes equal bilaterally. Sensory-motor testing grossly intact. Tendon reflexes grossly intact.  Pysch: Alert & oriented x 3.  Insight and judgement nl & appropriate. No ideations.   Assessment and Plan:   1. Essential hypertension  - Continue medication, monitor blood pressure at home.  - Continue DASH diet.  Reminder to go to the ER if any CP,  SOB, nausea, dizziness, severe HA, changes vision/speech.   -  CBC with Differential/Platelet - COMPLETE METABOLIC PANEL WITH GFR - Magnesium - TSH   2. Hyperlipidemia, mixed  - Continue diet/meds, exercise,& lifestyle modifications.  - Continue monitor periodic cholesterol/liver & renal functions     - Lipid panel - TSH  3. Abnormal glucose  - Continue diet, exercise  - Lifestyle modifications.  - Monitor appropriate labs    - Hemoglobin A1c - Insulin, random  4. Vitamin D deficiency  - Continue supplementation    - VITAMIN D 25 Hydroxy   5. Gastroesophageal reflux disease  - CBC with Differential/Platelet  6. Gout  - Uric acid  7. Medication management  - CBC with Differential/Platelet - COMPLETE METABOLIC PANEL WITH GFR - Magnesium - Lipid panel - TSH - Hemoglobin A1c - Insulin, random - VITAMIN D 25 Hydroxy - Uric acid        Discussed  regular exercise, BP monitoring, weight control to achieve/maintain BMI less than 25 and discussed med and SE's. Recommended labs to assess and monitor clinical status with further disposition pending results of labs.  I discussed the assessment and treatment plan with the patient. The patient was provided an opportunity to  ask questions and all were answered.  Given Rx for Lexapro & suggested 6 week f/u. The patient agreed with the plan and demonstrated an understanding of the instructions.  I provided over 30 minutes of exam, counseling, chart review and  complex critical decision making.        The patient was advised to call back or seek an in-person evaluation if the symptoms worsen or if the condition fails to improve as anticipated.   Kirtland Bouchard, MD

## 2020-10-10 ENCOUNTER — Ambulatory Visit: Payer: No Typology Code available for payment source | Admitting: Internal Medicine

## 2020-10-10 ENCOUNTER — Other Ambulatory Visit: Payer: Self-pay

## 2020-10-10 VITALS — BP 130/72 | HR 77 | Temp 97.3°F | Resp 16 | Ht 71.0 in | Wt 204.6 lb

## 2020-10-10 DIAGNOSIS — E782 Mixed hyperlipidemia: Secondary | ICD-10-CM | POA: Diagnosis not present

## 2020-10-10 DIAGNOSIS — F418 Other specified anxiety disorders: Secondary | ICD-10-CM

## 2020-10-10 DIAGNOSIS — M109 Gout, unspecified: Secondary | ICD-10-CM

## 2020-10-10 DIAGNOSIS — I10 Essential (primary) hypertension: Secondary | ICD-10-CM | POA: Diagnosis not present

## 2020-10-10 DIAGNOSIS — G59 Mononeuropathy in diseases classified elsewhere: Secondary | ICD-10-CM

## 2020-10-10 DIAGNOSIS — Z79899 Other long term (current) drug therapy: Secondary | ICD-10-CM

## 2020-10-10 DIAGNOSIS — E559 Vitamin D deficiency, unspecified: Secondary | ICD-10-CM | POA: Diagnosis not present

## 2020-10-10 DIAGNOSIS — R7309 Other abnormal glucose: Secondary | ICD-10-CM | POA: Diagnosis not present

## 2020-10-10 DIAGNOSIS — E538 Deficiency of other specified B group vitamins: Secondary | ICD-10-CM

## 2020-10-10 DIAGNOSIS — K219 Gastro-esophageal reflux disease without esophagitis: Secondary | ICD-10-CM

## 2020-10-10 MED ORDER — ESCITALOPRAM OXALATE 20 MG PO TABS: ORAL_TABLET | ORAL | 3 refills | Status: DC

## 2020-10-10 NOTE — Addendum Note (Signed)
Addended by: Unk Pinto on: 10/10/2020 01:39 PM   Modules accepted: Orders

## 2020-10-11 ENCOUNTER — Other Ambulatory Visit: Payer: Self-pay

## 2020-10-11 MED ORDER — ATORVASTATIN CALCIUM 80 MG PO TABS: ORAL_TABLET | ORAL | 1 refills | Status: DC

## 2020-10-11 NOTE — Progress Notes (Signed)
============================================================ -   Test results slightly outside the reference range are not unusual. If there is anything important, I will review this with you,  otherwise it is considered normal test values.  If you have further questions,  please do not hesitate to contact me at the office or via My Chart.  ============================================================ ============================================================  -  Liver enzymes are higher -  very suspect for liver inflammation  as hepatitis or cirrhosis   - Very important to Stop all Alcoholic beverage intake   - Recommend schedule an appointment to see a GI doctor Physiological scientist) to  help assess severity of the liver disease  - Would you like to see one in [place] or [place] or [place]  - then please get name & phone # for me  ============================================================ ============================================================  -   Magnesium  -  1.7    -  Still very  low- goal is betw 2.0 - 2.5,   - So.........Marland Kitchen Recommend that you take # 2 tablets of Magnesium 500 mg daily   - also important to eat lots of  leafy green vegetables   - spinach - Kale - collards - greens - okra - asparagus  - broccoli - quinoa - squash - almonds   - black, red, white beans  -  peas - green beans  ============================================================ ============================================================  -  Total Chol much worse - has gone up from 189   to   now   247    - and   - LDL Chol also very high - gone from 97  & 103   to now 154   - Are you still taking your Atorvastatin  ?  ============================================================ ============================================================  - Also,  Triglycerides (   242    ) or fats in blood are too high ! (goal is less than 150)    - Recommend  avoid fried & greasy foods,  sweets / candy,   - Avoid white rice  (brown or wild rice or Quinoa is OK),   - Avoid white potatoes  (sweet potatoes are OK)   - Avoid anything made from white flour  - bagels, doughnuts, rolls, buns, biscuits, white and   wheat breads, pizza crust and traditional  pasta made of white flour & egg white  - (vegetarian pasta or spinach or wheat pasta is OK).    - Multi-grain bread is OK - like multi-grain flat bread or  sandwich thins.   - Avoid alcohol in excess.   - Exercise is also important. ============================================================ ============================================================  -  A1c - Normal - No Diabetes  - Great !  ============================================================ ============================================================  -  Vitamin D = 87 - Excellent   ============================================================ ============================================================  -  Uric Acid / Gout test is Normal & OK  ============================================================ ============================================================  -  Vit B12 - still pending   - All Else - CBC - Kidneys - Electrolytes - Liver - Magnesium & Thyroid    - all  Normal / OK ============================================================ ============================================================

## 2020-10-12 LAB — TEST AUTHORIZATION

## 2020-10-12 LAB — CBC WITH DIFFERENTIAL/PLATELET
Absolute Monocytes: 414 cells/uL (ref 200–950)
Basophils Absolute: 18 cells/uL (ref 0–200)
Basophils Relative: 0.5 %
Eosinophils Absolute: 227 cells/uL (ref 15–500)
Eosinophils Relative: 6.3 %
HCT: 43.4 % (ref 38.5–50.0)
Hemoglobin: 15.3 g/dL (ref 13.2–17.1)
Lymphs Abs: 997 cells/uL (ref 850–3900)
MCH: 34.8 pg — ABNORMAL HIGH (ref 27.0–33.0)
MCHC: 35.3 g/dL (ref 32.0–36.0)
MCV: 98.6 fL (ref 80.0–100.0)
MPV: 10.6 fL (ref 7.5–12.5)
Monocytes Relative: 11.5 %
Neutro Abs: 1944 cells/uL (ref 1500–7800)
Neutrophils Relative %: 54 %
Platelets: 127 10*3/uL — ABNORMAL LOW (ref 140–400)
RBC: 4.4 10*6/uL (ref 4.20–5.80)
RDW: 13.5 % (ref 11.0–15.0)
Total Lymphocyte: 27.7 %
WBC: 3.6 10*3/uL — ABNORMAL LOW (ref 3.8–10.8)

## 2020-10-12 LAB — LIPID PANEL
Cholesterol: 247 mg/dL — ABNORMAL HIGH (ref <200)
HDL: 52 mg/dL (ref >=40)
LDL Cholesterol (Calc): 154 mg/dL (calc) — ABNORMAL HIGH
Non-HDL Cholesterol (Calc): 195 mg/dL (calc) — ABNORMAL HIGH (ref <130)
Total CHOL/HDL Ratio: 4.8 (calc) (ref <5.0)
Triglycerides: 242 mg/dL — ABNORMAL HIGH (ref <150)

## 2020-10-12 LAB — COMPLETE METABOLIC PANEL WITH GFR
AG Ratio: 1.4 (calc) (ref 1.0–2.5)
ALT: 105 U/L — ABNORMAL HIGH (ref 9–46)
AST: 139 U/L — ABNORMAL HIGH (ref 10–35)
Albumin: 4.4 g/dL (ref 3.6–5.1)
Alkaline phosphatase (APISO): 77 U/L (ref 35–144)
BUN: 8 mg/dL (ref 7–25)
CO2: 31 mmol/L (ref 20–32)
Calcium: 9.3 mg/dL (ref 8.6–10.3)
Chloride: 101 mmol/L (ref 98–110)
Creat: 0.81 mg/dL (ref 0.70–1.30)
Globulin: 3.2 g/dL (calc) (ref 1.9–3.7)
Glucose, Bld: 102 mg/dL — ABNORMAL HIGH (ref 65–99)
Potassium: 3.7 mmol/L (ref 3.5–5.3)
Sodium: 143 mmol/L (ref 135–146)
Total Bilirubin: 0.8 mg/dL (ref 0.2–1.2)
Total Protein: 7.6 g/dL (ref 6.1–8.1)
eGFR: 106 mL/min/{1.73_m2} (ref >=60)

## 2020-10-12 LAB — HEMOGLOBIN A1C
Hgb A1c MFr Bld: 5.3 % of total Hgb (ref <5.7)
Mean Plasma Glucose: 105 mg/dL
eAG (mmol/L): 5.8 mmol/L

## 2020-10-12 LAB — URIC ACID: Uric Acid, Serum: 4.7 mg/dL (ref 4.0–8.0)

## 2020-10-12 LAB — MAGNESIUM: Magnesium: 1.7 mg/dL (ref 1.5–2.5)

## 2020-10-12 LAB — INSULIN, RANDOM: Insulin: 24.1 u[IU]/mL — ABNORMAL HIGH

## 2020-10-12 LAB — TSH: TSH: 2.01 mIU/L (ref 0.40–4.50)

## 2020-10-12 LAB — VITAMIN D 25 HYDROXY (VIT D DEFICIENCY, FRACTURES): Vit D, 25-Hydroxy: 87 ng/mL (ref 30–100)

## 2020-10-12 LAB — VITAMIN B12: Vitamin B-12: 537 pg/mL (ref 200–1100)

## 2020-11-22 ENCOUNTER — Ambulatory Visit: Payer: No Typology Code available for payment source | Admitting: Internal Medicine

## 2020-12-27 ENCOUNTER — Other Ambulatory Visit: Payer: Self-pay | Admitting: Internal Medicine

## 2020-12-27 DIAGNOSIS — M1A9XX0 Chronic gout, unspecified, without tophus (tophi): Secondary | ICD-10-CM

## 2021-01-24 ENCOUNTER — Other Ambulatory Visit: Payer: Self-pay | Admitting: Internal Medicine

## 2021-01-24 MED ORDER — DEXAMETHASONE 4 MG PO TABS: ORAL_TABLET | ORAL | 0 refills | Status: DC

## 2021-04-03 ENCOUNTER — Encounter: Payer: Self-pay | Admitting: Internal Medicine

## 2021-04-08 ENCOUNTER — Other Ambulatory Visit: Payer: Self-pay | Admitting: Internal Medicine

## 2021-04-08 DIAGNOSIS — I10 Essential (primary) hypertension: Secondary | ICD-10-CM

## 2021-04-10 ENCOUNTER — Other Ambulatory Visit: Payer: Self-pay | Admitting: Internal Medicine

## 2021-04-11 ENCOUNTER — Ambulatory Visit: Payer: No Typology Code available for payment source | Admitting: Internal Medicine

## 2021-04-11 ENCOUNTER — Encounter: Payer: Self-pay | Admitting: Internal Medicine

## 2021-04-11 VITALS — BP 165/107 | HR 81 | Temp 97.9°F | Resp 16 | Ht 71.0 in | Wt 209.6 lb

## 2021-04-11 DIAGNOSIS — Z136 Encounter for screening for cardiovascular disorders: Secondary | ICD-10-CM

## 2021-04-11 DIAGNOSIS — R7309 Other abnormal glucose: Secondary | ICD-10-CM

## 2021-04-11 DIAGNOSIS — I10 Essential (primary) hypertension: Secondary | ICD-10-CM

## 2021-04-11 DIAGNOSIS — Z125 Encounter for screening for malignant neoplasm of prostate: Secondary | ICD-10-CM

## 2021-04-11 DIAGNOSIS — Z111 Encounter for screening for respiratory tuberculosis: Secondary | ICD-10-CM

## 2021-04-11 DIAGNOSIS — Z0000 Encounter for general adult medical examination without abnormal findings: Secondary | ICD-10-CM

## 2021-04-11 DIAGNOSIS — R5383 Other fatigue: Secondary | ICD-10-CM

## 2021-04-11 DIAGNOSIS — E782 Mixed hyperlipidemia: Secondary | ICD-10-CM

## 2021-04-11 DIAGNOSIS — I700 Atherosclerosis of aorta: Secondary | ICD-10-CM | POA: Diagnosis not present

## 2021-04-11 DIAGNOSIS — M109 Gout, unspecified: Secondary | ICD-10-CM

## 2021-04-11 DIAGNOSIS — K219 Gastro-esophageal reflux disease without esophagitis: Secondary | ICD-10-CM

## 2021-04-11 DIAGNOSIS — Z0001 Encounter for general adult medical examination with abnormal findings: Secondary | ICD-10-CM

## 2021-04-11 DIAGNOSIS — Z8249 Family history of ischemic heart disease and other diseases of the circulatory system: Secondary | ICD-10-CM | POA: Diagnosis not present

## 2021-04-11 DIAGNOSIS — Z79899 Other long term (current) drug therapy: Secondary | ICD-10-CM

## 2021-04-11 DIAGNOSIS — E559 Vitamin D deficiency, unspecified: Secondary | ICD-10-CM

## 2021-04-11 DIAGNOSIS — G250 Essential tremor: Secondary | ICD-10-CM

## 2021-04-11 MED ORDER — NIFEDIPINE ER OSMOTIC RELEASE 30 MG PO TB24: ORAL_TABLET | ORAL | 3 refills | Status: AC

## 2021-04-11 NOTE — Progress Notes (Signed)
? ?Annual  Screening/Preventative Visit  ?& Comprehensive Evaluation & Examination ? ?Future Appointments  ?Date Time Provider Department  ?04/11/2021  3:00 PM Unk Pinto, MD [facility]  ?04/12/2022  3:00 PM Unk Pinto, MD [facility]  ? ?    ?     This very nice 56 y.o. DWM  presents for a Screening /Preventative Visit & comprehensive evaluation and management of multiple medical co-morbidities.  Patient has been followed for HTN, HLD, Prediabetes and Vitamin D Deficiency. Patient's Gout is controlled on Allopurinol. Patient has familial or hereditary tremor and has been prescribed Propranolol & Diazepam.  Patient also has hx/o Alcoholism for which he's been prescribed Naltrexone. In October he was rx'd Lexapro for his chronic anxiety.  ? ? ?    HTN predates since  2006. Patient's recent home BPs have been elevated consistent with today's measurement .  Today's BP is elevated  at 165/107 and confirmed x 2. Patient reports that he's taking his meds on schedule. Patient denies any cardiac symptoms as chest pain, palpitations, shortness of breath, dizziness or ankle swelling. ? ? ?    Patient's hyperlipidemia is not controlled with diet and Atorvastatin.  Patient denies myalgias or other medication SE's. Last lipids were not at goal : ? ?Lab Results  ?Component Value Date  ? CHOL 247 (H) 10/10/2020  ? HDL 52 10/10/2020  ? LDLCALC 154 (H) 10/10/2020  ? TRIG 242 (H) 10/10/2020  ? CHOLHDL 4.8 10/10/2020  ? ? ? ?    Patient has been monitored expectantly for glucose intolerance  and patient denies reactive hypoglycemic symptoms, visual blurring, diabetic polys or paresthesias. Last A1c was normal & at goal : ?  ?Lab Results  ?Component Value Date  ? HGBA1C 5.3 10/10/2020  ?  ? ? ?    Finally, patient has history of Vitamin D Deficiency ("25" /2008) and last vitamin D was at goal : ?  ?Lab Results  ?Component Value Date  ? VD25OH 87 10/10/2020  ? ? ? ?Current Outpatient Medications on File Prior to Visit   ?Medication Sig  ? acyclovir 800 MG tablet TAKE 1 TABLET DAILY  AS NEEDED  ? allopurinol 300 MG tablet TAKE 1 TABLET DAILY   ? VITAMIN C ADULT GUMMIES  Take by mouth daily  ? atorvastatin 80 MG tablet TAKE 1 TABLET  EVERY DAY   ? buPROPion-XL 300 MG  TAKE 1 TABLET DAILY   ? VITAMIN D 5,000 Units Take daily.   ? diazepam 5 MG tablet Take 1 tablet  3 x /day for Tremor  ? escitalopram  20 MG tablet Take 1 tablet Daily for Mood  ? g CLARITIN-D 24 HOUR 10-240 MG  Take 1 tablet daily as needed  ? MAGNESIUM PO Take 1 tablet  daily.  ? nadolol 80 MG tablet Takes 1 tablet  Daily    ? naltrexone (DEPADE) 50 MG tablet Take  1 tablet  4 x /day  for alcohol "cravings"  ? olmesartan  40 MG tablet TAKES   1 TABLET AT BEDTIME   ? omeprazole 40 MG capsule Take  1 capsule  Daily   ? zinc  50 MG tablet Take daily.  ? ? ? ?Allergies  ?Allergen Reactions  ? Citalopram   ?  dyphoria  ? ? ? ?Past Medical History:  ?Diagnosis Date  ? Anxiety   ? Barrett's esophagus 06/30/2018  ? Encounter for long-term (current) use of other medications   ? Hx of adenomatous colonic polyps 06/30/2018  ?  Hyperlipidemia   ? Hypertension   ? Hypogonadism male   ? Vitamin D deficiency   ? ? ? ?Health Maintenance  ?Topic Date Due  ? Zoster Vaccines- Shingrix (1 of 2) Never done  ? COVID-19 Vaccine (3 - Pfizer risk series) 06/01/2019  ? INFLUENZA VACCINE  08/01/2021  ? TETANUS/TDAP  07/31/2025  ? Hepatitis C Screening  Completed  ? HIV Screening  Completed  ? HPV VACCINES  Aged Out  ? ? ? ?Immunization History  ?Administered Date(s) Administered  ? Influenza Inj Mdck Quad   09/30/2017  ? Influenza  10/07/2012  ? PFIZER SARS-COV-2 Vacc  04/13/2019, 05/04/2019  ? PPD Test 09/10/2016, 03/29/2020  ? Pneumococcal-23 02/11/2008  ? Td 12/05/2005  ? Tdap 08/01/2015  ? ? ?Last Colon - 06/25/2018 - 06/25/2018 - Dr Carlean Purl - 5 Adenomas - Recc 3 yr f/u due July 2023 ? ?Last EGD  - 06/25/2018  -  Dr Carlean Purl - short segment Barrett's - Recc 3 yr f/u - due July 2023 ? ?Past  Surgical History:  ?Procedure Laterality Date  ? HERNIA REPAIR    ? I & D EXTREMITY Right 06/19/2012  ? Procedure: IRRIGATION AND DEBRIDEMENT FOREARM;  Surgeon: Tennis Must, MD;  Location: [facility] [facility];  Service: Orthopedics;  Laterality: Right;  ? KNEE ARTHROSCOPY WITH ANTERIOR CRUCIATE LIGAMENT (ACL) REPAIR Left Jan 2017  ? Dr. Ballard Russell  ? LASIK  2006  ? VASECTOMY    ? ? ? ?Family History  ?Problem Relation Age of Onset  ? Cancer Mother   ?     Stomach, lymphoma, melanoma  ? Diabetes Mother   ? Heart disease Mother   ? Stomach cancer Maternal Grandmother   ? Colon cancer Neg Hx   ? Esophageal cancer Neg Hx   ? ? ? ?Social History  ? ?Tobacco Use  ? Smoking status: Never  ? Smokeless tobacco: Never  ?Vaping Use  ? Vaping Use: Never used  ?Substance Use Topics  ? Alcohol use: Yes  ? Drug use: No  ? ? ? ? ROS ?Constitutional: Denies fever, chills, weight loss/gain, headaches, insomnia,  night sweats or change in appetite. Does c/o fatigue. ?Eyes: Denies redness, blurred vision, diplopia, discharge, itchy or watery eyes.  ?ENT: Denies discharge, congestion, post nasal drip, epistaxis, sore throat, earache, hearing loss, dental pain, Tinnitus, Vertigo, Sinus pain or snoring.  ?Cardio: Denies chest pain, palpitations, irregular heartbeat, syncope, dyspnea, diaphoresis, orthopnea, PND, claudication or edema ?Respiratory: denies cough, dyspnea, DOE, pleurisy, hoarseness, laryngitis or wheezing.  ?Gastrointestinal: Denies dysphagia, heartburn, reflux, water brash, pain, cramps, nausea, vomiting, bloating, diarrhea, constipation, hematemesis, melena, hematochezia, jaundice or hemorrhoids ?Genitourinary: Denies dysuria, frequency, urgency, nocturia, hesitancy, discharge, hematuria or flank pain ?Musculoskeletal: Denies arthralgia, myalgia, stiffness, Jt. Swelling, pain, limp or strain/sprain. Denies Falls. ?Skin: Denies puritis, rash, hives, warts, acne, eczema or change in skin lesion ?Neuro: No weakness, tremor, incoordination,  spasms, paresthesia or pain ?Psychiatric: Denies confusion, memory loss or sensory loss. Denies Depression. ?Endocrine: Denies change in weight, skin, hair change, nocturia, and paresthesia, diabetic polys, visual blurring or hyper / hypo glycemic episodes.  ?Heme/Lymph: No excessive bleeding, bruising or enlarged lymph nodes. ? ? ?Physical Exam ? ?BP (!) 165/107   Pulse 81   Temp 97.9 ?F (36.6 ?C)   Resp 16   Ht '5\' 11"'$  (1.803 m)   Wt 209 lb 9.6 oz (95.1 kg)   SpO2 96%   BMI 29.23 kg/m?  ? ?General Appearance: Well nourished and well groomed and  in no apparent distress. ? ?Eyes: PERRLA, EOMs, conjunctiva no swelling or erythema, normal fundi and vessels. ?Sinuses: No frontal/maxillary tenderness ?ENT/Mouth: EACs patent / TMs  nl. Nares clear without erythema, swelling, mucoid exudates. Oral hygiene is good. No erythema, swelling, or exudate. Tongue normal, non-obstructing. Tonsils not swollen or erythematous. Hearing normal.  ?Neck: Supple, thyroid not palpable. No bruits, nodes or JVD. ?Respiratory: Respiratory effort normal.  BS equal and clear bilateral without rales, rhonci, wheezing or stridor. ?Cardio: Heart sounds are normal with regular rate and rhythm and no murmurs, rubs or gallops. Peripheral pulses are normal and equal bilaterally without edema. No aortic or femoral bruits. ?Chest: symmetric with normal excursions and percussion.  ?Abdomen: Soft, with Nl bowel sounds. Nontender, no guarding, rebound, hernias, masses, or organomegaly.  ?Lymphatics: Non tender without lymphadenopathy.  ?Musculoskeletal: Full ROM all peripheral extremities, joint stability, 5/5 strength, and normal gait. ?Skin: Warm and dry without rashes, lesions, cyanosis, clubbing or  ecchymosis.  ?Neuro: Cranial nerves intact, reflexes equal bilaterally. Normal muscle tone, no cerebellar symptoms. Sensation intact.  ?Pysch: Alert and oriented X 3 with normal affect, insight and judgment appropriate.  ? ?Assessment and Plan ? ?1.  Annual Preventative/Screening Exam  ? ? ?2. Essential hypertension ? ?- Added Rx Nifedipine 30 x 1 qd / bid.  ?& continue monitor home BP's & call if remain elevated  ? ?- EKG 12-Lead ?- [place], RETROPERITNL ABD,  LTD ?- [place]

## 2021-04-11 NOTE — Patient Instructions (Signed)

## 2021-04-12 LAB — CBC WITH DIFFERENTIAL/PLATELET
Absolute Monocytes: 651 cells/uL (ref 200–950)
Basophils Absolute: 31 cells/uL (ref 0–200)
Basophils Relative: 0.5 %
Eosinophils Absolute: 167 cells/uL (ref 15–500)
Eosinophils Relative: 2.7 %
HCT: 41.8 % (ref 38.5–50.0)
Hemoglobin: 14.6 g/dL (ref 13.2–17.1)
Lymphs Abs: 1097 cells/uL (ref 850–3900)
MCH: 32.4 pg (ref 27.0–33.0)
MCHC: 34.9 g/dL (ref 32.0–36.0)
MCV: 92.7 fL (ref 80.0–100.0)
MPV: 10.9 fL (ref 7.5–12.5)
Monocytes Relative: 10.5 %
Neutro Abs: 4253 cells/uL (ref 1500–7800)
Neutrophils Relative %: 68.6 %
Platelets: 186 10*3/uL (ref 140–400)
RBC: 4.51 10*6/uL (ref 4.20–5.80)
RDW: 13.9 % (ref 11.0–15.0)
Total Lymphocyte: 17.7 %
WBC: 6.2 10*3/uL (ref 3.8–10.8)

## 2021-04-12 LAB — TSH: TSH: 2.06 mIU/L (ref 0.40–4.50)

## 2021-04-12 LAB — COMPLETE METABOLIC PANEL WITH GFR
AG Ratio: 1.3 (calc) (ref 1.0–2.5)
ALT: 52 U/L — ABNORMAL HIGH (ref 9–46)
AST: 73 U/L — ABNORMAL HIGH (ref 10–35)
Albumin: 4.2 g/dL (ref 3.6–5.1)
Alkaline phosphatase (APISO): 95 U/L (ref 35–144)
BUN: 11 mg/dL (ref 7–25)
CO2: 28 mmol/L (ref 20–32)
Calcium: 9.4 mg/dL (ref 8.6–10.3)
Chloride: 100 mmol/L (ref 98–110)
Creat: 1.03 mg/dL (ref 0.70–1.30)
Globulin: 3.2 g/dL (calc) (ref 1.9–3.7)
Glucose, Bld: 96 mg/dL (ref 65–99)
Potassium: 3.6 mmol/L (ref 3.5–5.3)
Sodium: 140 mmol/L (ref 135–146)
Total Bilirubin: 1.3 mg/dL — ABNORMAL HIGH (ref 0.2–1.2)
Total Protein: 7.4 g/dL (ref 6.1–8.1)
eGFR: 87 mL/min/{1.73_m2} (ref >=60)

## 2021-04-12 LAB — URINALYSIS, ROUTINE W REFLEX MICROSCOPIC
Bacteria, UA: NONE SEEN /HPF
Bilirubin Urine: NEGATIVE
Glucose, UA: NEGATIVE
Hyaline Cast: NONE SEEN /LPF
Ketones, ur: NEGATIVE
Leukocytes,Ua: NEGATIVE
Nitrite: NEGATIVE
Specific Gravity, Urine: 1.006 (ref 1.001–1.035)
Squamous Epithelial / HPF: NONE SEEN /HPF (ref <=5)
WBC, UA: NONE SEEN /HPF (ref 0–5)
pH: 6 (ref 5.0–8.0)

## 2021-04-12 LAB — HEMOGLOBIN A1C
Hgb A1c MFr Bld: 5.9 % of total Hgb — ABNORMAL HIGH (ref <5.7)
Mean Plasma Glucose: 123 mg/dL
eAG (mmol/L): 6.8 mmol/L

## 2021-04-12 LAB — MICROALBUMIN / CREATININE URINE RATIO
Creatinine, Urine: 35 mg/dL (ref 20–320)
Microalb Creat Ratio: 2711 mcg/mg creat — ABNORMAL HIGH (ref <30)
Microalb, Ur: 94.9 mg/dL

## 2021-04-12 LAB — PSA: PSA: 2.65 ng/mL (ref <=4.00)

## 2021-04-12 LAB — IRON, TOTAL/TOTAL IRON BINDING CAP
%SAT: 29 % (calc) (ref 20–48)
Iron: 103 ug/dL (ref 50–180)
TIBC: 356 mcg/dL (calc) (ref 250–425)

## 2021-04-12 LAB — VITAMIN B12: Vitamin B-12: 434 pg/mL (ref 200–1100)

## 2021-04-12 LAB — VITAMIN D 25 HYDROXY (VIT D DEFICIENCY, FRACTURES): Vit D, 25-Hydroxy: 87 ng/mL (ref 30–100)

## 2021-04-12 LAB — INSULIN, RANDOM: Insulin: 20.6 u[IU]/mL — ABNORMAL HIGH

## 2021-04-12 LAB — MICROSCOPIC MESSAGE

## 2021-04-12 LAB — LIPID PANEL
Cholesterol: 131 mg/dL (ref <200)
HDL: 42 mg/dL (ref >=40)
LDL Cholesterol (Calc): 62 mg/dL (calc)
Non-HDL Cholesterol (Calc): 89 mg/dL (calc) (ref <130)
Total CHOL/HDL Ratio: 3.1 (calc) (ref <5.0)
Triglycerides: 205 mg/dL — ABNORMAL HIGH (ref <150)

## 2021-04-12 LAB — URIC ACID: Uric Acid, Serum: 4.6 mg/dL (ref 4.0–8.0)

## 2021-04-12 LAB — TESTOSTERONE: Testosterone: 235 ng/dL — ABNORMAL LOW (ref 250–827)

## 2021-04-12 LAB — MAGNESIUM: Magnesium: 1.6 mg/dL (ref 1.5–2.5)

## 2021-04-12 NOTE — Progress Notes (Signed)
<><><><><><><><><><><><><><><><><><><><><><><><><><><><><><><><><> ?<><><><><><><><><><><><><><><><><><><><><><><><><><><><><><><><><> ?- Test results slightly outside the reference range are not unusual. ?If there is anything important, I will review this with you,  ?otherwise it is considered normal test values.  ?If you have further questions,  ?please do not hesitate to contact me at the office or via My Chart.  ?<><><><><><><><><><><><><><><><><><><><><><><><><><><><><><><><><> ?<><><><><><><><><><><><><><><><><><><><><><><><><><><><><><><><><> ? ?-  Testosterone level is again low - ?                                               Taking Zinc may help raise testosterone levels naturally ? ?- Being overweight causes low testosterone levels,   ?                               as fat tissue metabolizes testosterone & converts it into Estrogen  !  ? ?- Exercising regularly  ( 2 x /day)  EVERY day helps  ?                                                       raise testosterone levels by increasing muscle mass ? ?- Alcohol causes testicular atrophy  ?                                         (or shrinkage of the testicles or "nuts")  ?                                                                             which normally produces Testosterone  ? ?- so stop drinking  Alcohol may help preserve Testicular function  <><><><><><><><><><><><><><><><><><><><><><><><><><><><><><><><><> ?<><><><><><><><><><><><><><><><><><><><><><><><><><><><><><><><><> ? ?-  U/A shows increased protein & Red Blood Cells in urine  ?                                                                                               & needs to be rechecked  ?- may need to see a Nephrologist specialist         ?If you can get Name, Address, Regular office phone #   &  ?  Fax  #  of a Naval architect (Nephrologist) ?- We can request a consultation ? ?- Fortunately , Metabolic  Profile shows Normal Kidney Functions/ ?<><><><><><><><><><><><><><><><><><><><><><><><><><><><><><><><><> ?<><><><><><><><><><><><><><><><><><><><><><><><><><><><><><><><><> ? ?-  Total Chol = 131    &   LDL Chol = 62   - Both  Excellent  ? ?- Very low risk for Heart Attack  / Stroke ?============================================================ ?============================================================ ? ?-   But Triglycerides (  205   ) or fats in blood are too high  ?                     (  Ideal or Goal is less than 150  !  )   ? ?- Recommend avoid fried & greasy foods,  sweets / candy,  ? ?- Avoid white rice  ?(brown or wild rice or Quinoa is OK),  ? ?- Avoid white potatoes  ?(sweet potatoes are OK)  ? ?- Avoid anything made from white flour  ?- bagels, doughnuts, rolls, buns, biscuits, white and  ?<><><><><><><><><><><><><><><><><><><><><><><><><><><><><><><><><> ?<><><><><><><><><><><><><><><><><><><><><><><><><><><><><><><><><> ? ?-  A1c is slightly elevated at 5.9%  in the borderline and  ?                                                         early or pre-diabetes range which has the same  ? ?300% increased risk for heart attack, stroke, cancer and  ?                                          alzheimer- type vascular dementia as full blown diabetes.  ? ?But the good news is that diet, exercise with  ?                                                    weight loss can cure the early diabetes at this point. ? ?<><><><><><><><><><><><><><><><><><><><><><><><><><><><><><><><><> ?<><><><><><><><><><><><><><><><><><><><><><><><><><><><><><><><><> ? ?-  PSA is back in Normal range !   Great, ?<><><><><><><><><><><><><><><><><><><><><><><><><><><><><><><><><> ?<><><><><><><><><><><><><><><><><><><><><><><><><><><><><><><><><> ? ?-  Iron level is Normal  ?<><><><><><><><><><><><><><><><><><><><><><><><><><><><><><><><><> ?<><><><><><><><><><><><><><><><><><><><><><><><><><><><><><><><><> ? ?-  ?-  Vitamin B12 =    434  is    Very Low  ?               (Ideal or Goal Vit B12 is between 450 - 1,100)  ? ?Low Vit B12 may be associated with Anemia , Fatigue,  ? ?Peripheral Neuropathy, Dementia, "Brain Fog", & Depression ? ?- Recommend take a sub-lingual form of Vitamin B12 tablet  ? ?1,000 to 5,000 mcg tab that you dissolve under your tongue /Daily  ? ?- Can get Baron Sane - best price at [employer] or on [employer] ?<><><><><><><><><><><><><><><><><><><><><><><><><><><><><><><><><> ?<><><><><><><><><><><><><><><><><><><><><><><><><><><><><><><><><> ? ?-   Magnesium  = 1.6    -  very  low  ?                                          - goal is betw 2.0 - 2.5,  ? ?-  So..............Marland Kitchen  Recommend that you take  ?Magnesium 500 mg tablet daily  ? ?- also important to eat lots of  leafy green vegetables  ? ?- spinach - Kale - collards - greens - okra - asparagus  ?- broccoli - quinoa - squash - almonds  ? ?- black, red, white beans ? ?-  peas - green beans ?<><><><><><><><><><><><><><><><><><><><><><><><><><><><><><><><><> ?<><><><><><><><><><><><><><><><><><><><><><><><><><><><><><><><><> ? ?-  Vitamin D = 87  - Excellent !  - Please keep dose of Vitamin D  same  ?<><><><><><><><><><><><><><><><><><><><><><><><><><><><><><><><><> ?<><><><><><><><><><><><><><><><><><><><><><><><><><><><><><><><><> ? ?-  Uric Acid  / Gout test is Normal -  Please keep taking Allopurinol ?<><><><><><><><><><><><><><><><><><><><><><><><><><><><><><><><><> ?<><><><><><><><><><><><><><><><><><><><><><><><><><><><><><><><><> ? ?-  Liver  enzymes are elevated, most likely due to  ?                                            early Alcoholic Liver Disease which will lead to Cirrhosis ? ?- Alcohol is a toxin or poison to the liver, So please stop Alcohol  ! ? ?All Else - CBC - Kidneys - Electrolytes  - & Thyroid   ? ?- all  Normal /  OK ?<><><><><><><><><><><><><><><><><><><><><><><><><><><><><><><><><> ?<><><><><><><><><><><><><><><><><><><><><><><><><><><><><><><><><> ? ? ? ? ? ? ? ? ? ? ? ? ? ? ? ? ? ? ? ? ? ? ? ? ? ? ? ? ? ? ? ? ? ? ? ? ? ?

## 2021-04-28 ENCOUNTER — Other Ambulatory Visit: Payer: Self-pay | Admitting: Internal Medicine

## 2021-04-28 DIAGNOSIS — I10 Essential (primary) hypertension: Secondary | ICD-10-CM

## 2021-04-28 DIAGNOSIS — G250 Essential tremor: Secondary | ICD-10-CM

## 2021-07-17 ENCOUNTER — Ambulatory Visit: Payer: No Typology Code available for payment source | Admitting: Nurse Practitioner

## 2021-07-19 ENCOUNTER — Encounter: Payer: Self-pay | Admitting: Internal Medicine

## 2021-07-20 ENCOUNTER — Encounter: Payer: Self-pay | Admitting: Internal Medicine

## 2021-07-21 ENCOUNTER — Other Ambulatory Visit: Payer: Self-pay | Admitting: Internal Medicine

## 2021-07-21 DIAGNOSIS — R609 Edema, unspecified: Secondary | ICD-10-CM

## 2021-07-21 MED ORDER — TORSEMIDE 20 MG PO TABS: ORAL_TABLET | ORAL | 0 refills | Status: DC

## 2021-08-12 ENCOUNTER — Other Ambulatory Visit: Payer: Self-pay | Admitting: Internal Medicine

## 2021-08-12 DIAGNOSIS — R609 Edema, unspecified: Secondary | ICD-10-CM

## 2021-09-10 ENCOUNTER — Encounter: Payer: Self-pay | Admitting: Internal Medicine

## 2021-09-20 NOTE — Progress Notes (Signed)
Future Appointments  Date Time Provider Department  09/21/2021  4:30 PM Unk Pinto, MD [facility]  10/23/2021 10:30 AM Unk Pinto, MD [facility]  04/12/2022  3:00 PM Unk Pinto, MD [facility]    History of Present Illness:      Patient is a very nice 56 yo DWM with c/o intermittent painless hematuria /hematospermia. Denies voiding difficulties. Also reports that he has been having worsening dependent edema .   Medications   Current Outpatient Medications (Cardiovascular):    atorvastatin (LIPITOR) 80 MG tablet, TAKE 1 TABLET BY MOUTH EVERY DAY FOR CHOLESTEROL   nadolol (CORGARD) 80 MG tablet, TAKE 1/2 TO 1 TABLET DAILY FOR BP & TREMOR   NIFEdipine (PROCARDIA XL) 30 MG 24 hr tablet, Take  1 tablet   2 x/day (every 12 hours) for BP   olmesartan (BENICAR) 40 MG tablet, TAKE 1/2 TO 1 TABLET AT BEDTIME FOR BP   torsemide (DEMADEX) 20 MG tablet, Take  1 tablet  2 x / day  for  Fluid Retention / Ankle Swelling  Current Outpatient Medications (Respiratory):    loratadine-pseudoephedrine (CLARITIN-D 24 HOUR) 10-240 MG 24 hr tablet, Take 1 tablet by mouth daily. (Patient taking differently: Take 1 tablet by mouth as needed.)  Current Outpatient Medications (Analgesics):    allopurinol (ZYLOPRIM) 300 MG tablet, TAKE 1 TABLET DAILY TO PREVENT GOUT   Current Outpatient Medications (Other):    acyclovir (ZOVIRAX) 800 MG tablet, TAKE 1 TABLET DAILY FOR FEVER BLISTERS AS NEEDED   Ascorbic Acid (VITAMIN C ADULT GUMMIES PO), Take by mouth daily. 1 gummy daily   buPROPion (WELLBUTRIN XL) 300 MG 24 hr tablet, TAKE 1 TABLET DAILY FOR MOOD, FOCUS & CONCENTRATION.   Cholecalciferol (VITAMIN D3) 400 units CAPS, Take 5,000 Units by mouth daily.    diazepam (VALIUM) 5 MG tablet, Take 1 tablet  3 x /day for Tremor   escitalopram (LEXAPRO) 20 MG tablet, Take 1 tablet Daily for Mood   MAGNESIUM PO, Take 1 tablet by mouth daily.   naltrexone (DEPADE) 50 MG tablet, Take  1 tablet  4 x  /day  at Meals & Bedtime for "cravings"   omeprazole (PRILOSEC) 40 MG capsule, Take  1 capsule  Daily  to Prevent Heartburn & Indigestion   zinc gluconate 50 MG tablet, Take 50 mg by mouth daily.   potassium chloride SA (KLOR-CON M) 20 MEQ tablet, Take 1 tablet  3 x /day with Meals for Potassium  Problem list He has Hypertension; Hyperlipidemia, mixed; Vitamin D deficiency; Medication management; Abnormal glucose; Overweight (BMI 25.0-29.9); GERD (gastroesophageal reflux disease); Depression with anxiety; Chronic gout involving toe of left foot without tophus; Hepatic steatosis with borderline hepatomegaly; Persistent proteinuria; Hx of adenomatous colonic polyps; Barrett's esophagus; Alcoholism /alcohol abuse; Tremor; Right cervical radiculopathy; and Hypomagnesemia on their problem list.   Observations/Objective:  BP (!) 160/90   Pulse 75   Temp 97.8 F (36.6 C)   Resp 16   Ht '5\' 11"'$  (1.803 m)   Wt 205 lb (93 kg)   SpO2 99%   BMI 28.59 kg/m   HEENT - WNL. Neck - supple.  Chest - Clear equal BS. Cor - Nl HS. RRR w/o sig MGR. PP 1(+). No edema. MS- FROM w/o deformities.  Gait Nl. Neuro -  Nl w/o focal abnormalities.   Assessment and Plan:  1. Essential hypertension   2. Edema , ? Consequent of Nifedipine  - COMPLETE METABOLIC PANEL WITH GFR - torsemide (DEMADEX) 20 MG tablet; Take  1 tablet  2 x / day  for  Fluid Retention / Ankle Swelling  Dispense: 180 tablet; Refill: 3  3. Dysuria  - Urinalysis, Routine w reflex microscopic - Urine Culture  4. Gross hematuria  - CBC with Differential/Platelet - Urinalysis, Routine w reflex microscopic - Urine Culture  5. Elevated PSA  - PSA, total and free  Follow Up Instructions:      I discussed the assessment and treatment plan with the patient. The patient was provided an opportunity to ask questions and all were answered. The patient agreed with the plan and demonstrated an understanding of the instructions.       The  patient was advised to call back or seek an in-person evaluation if the symptoms worsen or if the condition fails to improve as anticipated.    Kirtland Bouchard, MD

## 2021-09-21 ENCOUNTER — Encounter: Payer: Self-pay | Admitting: Internal Medicine

## 2021-09-21 ENCOUNTER — Ambulatory Visit: Payer: No Typology Code available for payment source | Admitting: Internal Medicine

## 2021-09-21 VITALS — BP 160/90 | HR 75 | Temp 97.8°F | Resp 16 | Ht 71.0 in | Wt 205.0 lb

## 2021-09-21 DIAGNOSIS — R300 Dysuria: Secondary | ICD-10-CM

## 2021-09-21 DIAGNOSIS — R310 Gross hematuria: Secondary | ICD-10-CM

## 2021-09-21 DIAGNOSIS — Z79899 Other long term (current) drug therapy: Secondary | ICD-10-CM

## 2021-09-21 DIAGNOSIS — R609 Edema, unspecified: Secondary | ICD-10-CM | POA: Diagnosis not present

## 2021-09-21 DIAGNOSIS — I10 Essential (primary) hypertension: Secondary | ICD-10-CM | POA: Diagnosis not present

## 2021-09-21 DIAGNOSIS — R972 Elevated prostate specific antigen [PSA]: Secondary | ICD-10-CM

## 2021-09-21 MED ORDER — TORSEMIDE 20 MG PO TABS: ORAL_TABLET | ORAL | 3 refills | Status: DC

## 2021-09-21 NOTE — Patient Instructions (Signed)

## 2021-09-22 ENCOUNTER — Other Ambulatory Visit: Payer: Self-pay | Admitting: Internal Medicine

## 2021-09-22 DIAGNOSIS — E876 Hypokalemia: Secondary | ICD-10-CM

## 2021-09-22 MED ORDER — POTASSIUM CHLORIDE CRYS ER 20 MEQ PO TBCR: EXTENDED_RELEASE_TABLET | ORAL | 1 refills | Status: DC

## 2021-09-22 NOTE — Progress Notes (Signed)
<><><><><><><><><><><><><><><><><><><><><><><><><><><><><><><><><> <><><><><><><><><><><><><><><><><><><><><><><><><><><><><><><><><> -   Test results slightly outside the reference range are not unusual. If there is anything important, I will review this with you,  otherwise it is considered normal test values.  If you have further questions,  please do not hesitate to contact me at the office or via My Chart.  <><><><><><><><><><><><><><><><><><><><><><><><><><><><><><><><><> <><><><><><><><><><><><><><><><><><><><><><><><><><><><><><><><><>  -  Potassium (KCL) = 2.8 is very low                                        - likely due to your Demadex /Torsemide fluid pill, So  - Sent in Rx for a potassium pill to take 3 x /day with meals for potassium replacement  =================================================================  - Recommend  Stop your Procardia / Nifedipine  - BP med as                     it's likely the cause of your swelling of your legs / ankles as a side -effect =================================================================  - U/A does show blood as expected , but culture is still pending   - Please investigate if there is a local urologist that you could see &   we can try try to make the referral there,  so   you won't have to do so much traveling back & forth to [place]  - I anticipate that you will need to have a bladder cystoscopy for                                                                  someone to look inside your bladder  - PSA- prostate enzymes still pending, but I expect them to be normal, So  - Next step will likely need to see a Urologist   <><><><><><><><><><><><><><><><><><><><><><><><><><><><><><><><><> <><><><><><><><><><><><><><><><><><><><><><><><><><><><><><><><><>

## 2021-09-23 ENCOUNTER — Encounter: Payer: Self-pay | Admitting: Internal Medicine

## 2021-09-25 LAB — URINALYSIS, ROUTINE W REFLEX MICROSCOPIC
Bacteria, UA: NONE SEEN /HPF
Bilirubin Urine: NEGATIVE
Glucose, UA: NEGATIVE
Ketones, ur: NEGATIVE
Leukocytes,Ua: NEGATIVE
Nitrite: NEGATIVE
RBC / HPF: >=60 /HPF — AB (ref 0–2)
Specific Gravity, Urine: 1.007 (ref 1.001–1.035)
Squamous Epithelial / HPF: NONE SEEN /HPF (ref <=5)
pH: 6.5 (ref 5.0–8.0)

## 2021-09-25 LAB — CBC WITH DIFFERENTIAL/PLATELET
Absolute Monocytes: 970 cells/uL — ABNORMAL HIGH (ref 200–950)
Basophils Absolute: 51 cells/uL (ref 0–200)
Basophils Relative: 0.5 %
Eosinophils Absolute: 303 cells/uL (ref 15–500)
Eosinophils Relative: 3 %
HCT: 36.9 % — ABNORMAL LOW (ref 38.5–50.0)
Hemoglobin: 13.4 g/dL (ref 13.2–17.1)
Lymphs Abs: 2040 cells/uL (ref 850–3900)
MCH: 34.2 pg — ABNORMAL HIGH (ref 27.0–33.0)
MCHC: 36.3 g/dL — ABNORMAL HIGH (ref 32.0–36.0)
MCV: 94.1 fL (ref 80.0–100.0)
MPV: 10.5 fL (ref 7.5–12.5)
Monocytes Relative: 9.6 %
Neutro Abs: 6737 cells/uL (ref 1500–7800)
Neutrophils Relative %: 66.7 %
Platelets: 175 10*3/uL (ref 140–400)
RBC: 3.92 10*6/uL — ABNORMAL LOW (ref 4.20–5.80)
RDW: 13.5 % (ref 11.0–15.0)
Total Lymphocyte: 20.2 %
WBC: 10.1 10*3/uL (ref 3.8–10.8)

## 2021-09-25 LAB — PSA, TOTAL AND FREE
PSA, % Free: 21 % (calc) — ABNORMAL LOW (ref >25)
PSA, Free: 0.7 ng/mL
PSA, Total: 3.4 ng/mL (ref <=4.0)

## 2021-09-25 LAB — COMPLETE METABOLIC PANEL WITH GFR
AG Ratio: 1.2 (calc) (ref 1.0–2.5)
ALT: 30 U/L (ref 9–46)
AST: 41 U/L — ABNORMAL HIGH (ref 10–35)
Albumin: 3.8 g/dL (ref 3.6–5.1)
Alkaline phosphatase (APISO): 107 U/L (ref 35–144)
BUN/Creatinine Ratio: 9 (calc) (ref 6–22)
BUN: 16 mg/dL (ref 7–25)
CO2: 31 mmol/L (ref 20–32)
Calcium: 8.9 mg/dL (ref 8.6–10.3)
Chloride: 98 mmol/L (ref 98–110)
Creat: 1.78 mg/dL — ABNORMAL HIGH (ref 0.70–1.30)
Globulin: 3.3 g/dL (calc) (ref 1.9–3.7)
Glucose, Bld: 91 mg/dL (ref 65–99)
Potassium: 2.8 mmol/L — ABNORMAL LOW (ref 3.5–5.3)
Sodium: 141 mmol/L (ref 135–146)
Total Bilirubin: 0.9 mg/dL (ref 0.2–1.2)
Total Protein: 7.1 g/dL (ref 6.1–8.1)
eGFR: 45 mL/min/{1.73_m2} — ABNORMAL LOW (ref >=60)

## 2021-09-25 LAB — URINE CULTURE
MICRO NUMBER:: 13950992
Result:: NO GROWTH
SPECIMEN QUALITY:: ADEQUATE

## 2021-09-25 LAB — MICROSCOPIC MESSAGE

## 2021-10-01 ENCOUNTER — Other Ambulatory Visit: Payer: Self-pay | Admitting: Nurse Practitioner

## 2021-10-17 ENCOUNTER — Other Ambulatory Visit: Payer: Self-pay | Admitting: Internal Medicine

## 2021-10-17 DIAGNOSIS — I10 Essential (primary) hypertension: Secondary | ICD-10-CM

## 2021-10-23 ENCOUNTER — Ambulatory Visit: Payer: No Typology Code available for payment source | Admitting: Internal Medicine

## 2021-10-28 ENCOUNTER — Other Ambulatory Visit: Payer: Self-pay | Admitting: Internal Medicine

## 2022-02-10 ENCOUNTER — Other Ambulatory Visit: Payer: Self-pay | Admitting: Internal Medicine

## 2022-02-10 DIAGNOSIS — E876 Hypokalemia: Secondary | ICD-10-CM

## 2022-02-28 ENCOUNTER — Other Ambulatory Visit: Payer: Self-pay | Admitting: Internal Medicine

## 2022-04-12 ENCOUNTER — Ambulatory Visit (INDEPENDENT_AMBULATORY_CARE_PROVIDER_SITE_OTHER): Payer: BLUE CROSS/BLUE SHIELD | Admitting: Internal Medicine

## 2022-04-12 ENCOUNTER — Encounter: Payer: Self-pay | Admitting: Internal Medicine

## 2022-04-12 VITALS — BP 160/88 | HR 80 | Temp 97.9°F | Resp 16 | Ht 71.0 in | Wt 210.2 lb

## 2022-04-12 DIAGNOSIS — Z111 Encounter for screening for respiratory tuberculosis: Secondary | ICD-10-CM | POA: Diagnosis not present

## 2022-04-12 DIAGNOSIS — E559 Vitamin D deficiency, unspecified: Secondary | ICD-10-CM

## 2022-04-12 DIAGNOSIS — Z0001 Encounter for general adult medical examination with abnormal findings: Secondary | ICD-10-CM

## 2022-04-12 DIAGNOSIS — K219 Gastro-esophageal reflux disease without esophagitis: Secondary | ICD-10-CM

## 2022-04-12 DIAGNOSIS — E538 Deficiency of other specified B group vitamins: Secondary | ICD-10-CM

## 2022-04-12 DIAGNOSIS — Z1389 Encounter for screening for other disorder: Secondary | ICD-10-CM | POA: Diagnosis not present

## 2022-04-12 DIAGNOSIS — I700 Atherosclerosis of aorta: Secondary | ICD-10-CM

## 2022-04-12 DIAGNOSIS — Z125 Encounter for screening for malignant neoplasm of prostate: Secondary | ICD-10-CM

## 2022-04-12 DIAGNOSIS — Z130 Encounter for screening for diseases of the blood and blood-forming organs and certain disorders involving the immune mechanism: Secondary | ICD-10-CM | POA: Diagnosis not present

## 2022-04-12 DIAGNOSIS — M109 Gout, unspecified: Secondary | ICD-10-CM

## 2022-04-12 DIAGNOSIS — Z1211 Encounter for screening for malignant neoplasm of colon: Secondary | ICD-10-CM

## 2022-04-12 DIAGNOSIS — R350 Frequency of micturition: Secondary | ICD-10-CM | POA: Diagnosis not present

## 2022-04-12 DIAGNOSIS — Z79899 Other long term (current) drug therapy: Secondary | ICD-10-CM | POA: Diagnosis not present

## 2022-04-12 DIAGNOSIS — G250 Essential tremor: Secondary | ICD-10-CM

## 2022-04-12 DIAGNOSIS — Z131 Encounter for screening for diabetes mellitus: Secondary | ICD-10-CM

## 2022-04-12 DIAGNOSIS — Z8249 Family history of ischemic heart disease and other diseases of the circulatory system: Secondary | ICD-10-CM

## 2022-04-12 DIAGNOSIS — Z1329 Encounter for screening for other suspected endocrine disorder: Secondary | ICD-10-CM

## 2022-04-12 DIAGNOSIS — I10 Essential (primary) hypertension: Secondary | ICD-10-CM | POA: Diagnosis not present

## 2022-04-12 DIAGNOSIS — N401 Enlarged prostate with lower urinary tract symptoms: Secondary | ICD-10-CM

## 2022-04-12 DIAGNOSIS — Z0000 Encounter for general adult medical examination without abnormal findings: Secondary | ICD-10-CM | POA: Diagnosis not present

## 2022-04-12 DIAGNOSIS — R5383 Other fatigue: Secondary | ICD-10-CM

## 2022-04-12 DIAGNOSIS — E782 Mixed hyperlipidemia: Secondary | ICD-10-CM

## 2022-04-12 DIAGNOSIS — Z13220 Encounter for screening for lipoid disorders: Secondary | ICD-10-CM

## 2022-04-12 DIAGNOSIS — R7309 Other abnormal glucose: Secondary | ICD-10-CM

## 2022-04-12 DIAGNOSIS — Z136 Encounter for screening for cardiovascular disorders: Secondary | ICD-10-CM

## 2022-04-12 LAB — CBC WITH DIFFERENTIAL/PLATELET
Absolute Monocytes: 881 cells/uL (ref 200–950)
Basophils Relative: 0.3 %
HCT: 30.3 % — ABNORMAL LOW (ref 38.5–50.0)
MCV: 100.3 fL — ABNORMAL HIGH (ref 80.0–100.0)
MPV: 10.4 fL (ref 7.5–12.5)
RDW: 13.8 % (ref 11.0–15.0)
Total Lymphocyte: 19.1 %

## 2022-04-12 MED ORDER — TADALAFIL 5 MG PO TABS
ORAL_TABLET | ORAL | 3 refills | Status: AC
Start: 2022-04-12 — End: ?

## 2022-04-12 NOTE — Progress Notes (Signed)
Annual  Screening/Preventative Visit  & Comprehensive Evaluation & Examination  Future Appointments  Date Time Provider Department  04/12/2022  3:00 PM Lucky Cowboy, MD [facility]  04/18/2023  3:00 PM Lucky Cowboy, MD [facility]            This very nice 57 y.o. DWM  with   Patient has been followed for HTN, HLD, Prediabetes,  familial /hereditary tremor and Vitamin D Deficiency  presents for a Screening /Preventative Visit & comprehensive evaluation and management of multiple medical co-morbidities.  . Patient's Gout is controlled on Allopurinol. Patient has been prescribed Propranolol & Diazepam for his hereditary tremor .  Patient also has hx/o Alcoholism for which he's been prescribed Naltrexone, but relates that he stopped after a few doses . In 2022, he was rx'd Lexapro for his chronic anxiety.        HTN predates since  2006.  Patient's recent home BPs have been   normal .  Today's BP is elevated  at 160/88  and confirmed x 2. Patient reports that he's taking his meds on schedule. Patient denies any cardiac symptoms as chest pain, palpitations, shortness of breath, dizziness or ankle swelling.       Patient's hyperlipidemia is  controlled with diet and Atorvastatin.  Patient denies myalgias or other medication SE's. Last lipids were at goal  except elevated Trig's :  Lab Results  Component Value Date   CHOL 131 04/11/2021   HDL 42 04/11/2021   LDLCALC 62 04/11/2021   TRIG 205 (H) 04/11/2021   CHOLHDL 3.1 04/11/2021         Patient has been monitored expectantly for glucose intolerance  and patient denies reactive hypoglycemic symptoms, visual blurring, diabetic polys or paresthesias. Last A1c was near goal :  Lab Results  Component Value Date   HGBA1C 5.9 (H) 04/11/2021         Finally, patient has history of Vitamin D Deficiency ("25" /2008) and last vitamin D was at goal :   Lab Results  Component Value Date   VD25OH 87 04/11/2021       Current  Outpatient Medications on File Prior to Visit  Medication Sig   acyclovir 800 MG tablet TAKE 1 TABLET DAILY  AS NEEDED   allopurinol 300 MG tablet TAKE 1 TABLET DAILY    VITAMIN C ADULT GUMMIES  Take by mouth daily   atorvastatin 80 MG tablet TAKE 1 TABLET  EVERY DAY    buPROPion-XL 300 MG  TAKE 1 TABLET DAILY    VITAMIN D 5,000 Units Take daily.    diazepam 5 MG tablet Take 1 tablet  3 x /day for Tremor   escitalopram  20 MG tablet Take 1 tablet Daily for Mood   g CLARITIN-D 24 HOUR 10-240 MG  Take 1 tablet daily as needed   MAGNESIUM PO Take 1 tablet  daily.    Takes 1 tablet  Daily     olmesartan  40 MG tablet TAKES   1 TABLET AT BEDTIME    omeprazole 40 MG capsule Take  1 capsule  Daily    zinc  50 MG tablet Take daily.     Allergies  Allergen Reactions   Citalopram     dyphoria     Past Medical History:  Diagnosis Date   Anxiety    Barrett's esophagus 06/30/2018   Encounter for long-term (current) use of other medications    Hx of adenomatous colonic polyps 06/30/2018   Hyperlipidemia  Hypertension    Hypogonadism male    Vitamin D deficiency      Health Maintenance  Topic Date Due   Zoster Vaccines- Shingrix (1 of 2) Never done   COVID-19 Vaccine (3 - Pfizer risk series) 06/01/2019   INFLUENZA VACCINE  08/01/2021   TETANUS/TDAP  07/31/2025   Hepatitis C Screening  Completed   HIV Screening  Completed   HPV VACCINES  Aged Out     Immunization History  Administered Date(s) Administered   Influenza Inj Mdck Quad   09/30/2017   Influenza  10/07/2012   PFIZER SARS-COV-2 Vacc  04/13/2019, 05/04/2019   PPD Test 09/10/2016, 03/29/2020   Pneumococcal-23 02/11/2008   Td 12/05/2005   Tdap 08/01/2015    Last Colon - 06/25/2018 - 06/25/2018 - Dr Leone Payor - 5 Adenomas - Recc 3 yr f/u due July 2023  Last EGD  - 06/25/2018  -  Dr Leone Payor - short segment Barrett's - Recc 3 yr f/u - due July 2023  Past Surgical History:  Procedure Laterality Date   HERNIA REPAIR      I & D EXTREMITY Right 06/19/2012   Procedure: IRRIGATION AND DEBRIDEMENT FOREARM;  Surgeon: Tami Ribas, MD;  Location: [facility] OR;  Service: Orthopedics;  Laterality: Right;   KNEE ARTHROSCOPY WITH ANTERIOR CRUCIATE LIGAMENT (ACL) REPAIR Left Jan 2017   Dr. Corinna Capra   LASIK  2006   VASECTOMY       Family History  Problem Relation Age of Onset   Cancer Mother        Stomach, lymphoma, melanoma   Diabetes Mother    Heart disease Mother    Stomach cancer Maternal Grandmother    Colon cancer Neg Hx    Esophageal cancer Neg Hx      Social History   Tobacco Use   Smoking status: Never   Smokeless tobacco: Never  Vaping Use   Vaping Use: Never used  Substance Use Topics   Alcohol use: Yes   Drug use: No      ROS Constitutional: Denies fever, chills, weight loss/gain, headaches, insomnia,  night sweats or change in appetite. Does c/o fatigue. Eyes: Denies redness, blurred vision, diplopia, discharge, itchy or watery eyes.  ENT: Denies discharge, congestion, post nasal drip, epistaxis, sore throat, earache, hearing loss, dental pain, Tinnitus, Vertigo, Sinus pain or snoring.  Cardio: Denies chest pain, palpitations, irregular heartbeat, syncope, dyspnea, diaphoresis, orthopnea, PND, claudication or edema Respiratory: denies cough, dyspnea, DOE, pleurisy, hoarseness, laryngitis or wheezing.  Gastrointestinal: Denies dysphagia, heartburn, reflux, water brash, pain, cramps, nausea, vomiting, bloating, diarrhea, constipation, hematemesis, melena, hematochezia, jaundice or hemorrhoids Genitourinary: Denies dysuria,  discharge, hematuria or flank pain.  C/o frequency, urgency, nocturia &  hesitancy Musculoskeletal: Denies arthralgia, myalgia, stiffness, Jt. Swelling, pain, limp or strain/sprain. Denies Falls. Skin: Denies puritis, rash, hives, warts, acne, eczema or change in skin lesion Neuro: No weakness, tremor, incoordination, spasms, paresthesia or pain Psychiatric: Denies confusion,  memory loss or sensory loss. Denies Depression. Endocrine: Denies change in weight, skin, hair change, nocturia, and paresthesia, diabetic polys, visual blurring or hyper / hypo glycemic episodes.  Heme/Lymph: No excessive bleeding, bruising or enlarged lymph nodes.   Physical Exam  BP (!) 160/88   Pulse 80   Temp 97.9 F (36.6 C)   Resp 16   Ht 5\' 11"  (1.803 m)   Wt 210 lb 3.2 oz (95.3 kg)   SpO2 99%   BMI 29.32 kg/m   General Appearance: Well nourished and well groomed  and in no apparent distress.  Eyes: PERRLA, EOMs, conjunctiva no swelling or erythema, normal fundi and vessels. Sinuses: No frontal/maxillary tenderness ENT/Mouth: EACs patent / TMs  nl. Nares clear without erythema, swelling, mucoid exudates. Oral hygiene is good. No erythema, swelling, or exudate. Tongue normal, non-obstructing. Tonsils not swollen or erythematous. Hearing normal.  Neck: Supple, thyroid not palpable. No bruits, nodes or JVD. Respiratory: Respiratory effort normal.  BS equal and clear bilateral without rales, rhonci, wheezing or stridor. Cardio: Heart sounds are normal with regular rate and rhythm and no murmurs, rubs or gallops. Peripheral pulses are normal and equal bilaterally without edema. No aortic or femoral bruits. Chest: symmetric with normal excursions and percussion.  Abdomen: Soft, with Nl bowel sounds. Nontender, no guarding, rebound, hernias, masses, or organomegaly.  Lymphatics: Non tender without lymphadenopathy.  Musculoskeletal: Full ROM all peripheral extremities, joint stability, 5/5 strength, and normal gait. Skin: Warm and dry without rashes, lesions, cyanosis, clubbing or  ecchymosis.  Neuro: Cranial nerves intact, reflexes equal bilaterally. Normal muscle tone, no cerebellar symptoms. Sensation intact.  Pysch: Alert and oriented X 3 with normal affect, insight and judgment appropriate.   Assessment and Plan  1. Annual Preventative/Screening Exam   - CBC with  Differential/Platelet - COMPLETE METABOLIC PANEL WITH GFR - Magnesium  2. Essential hypertension  - EKG 12-Lead - US, RETROPERITNL ABD,  LTD - TSH  3. Hyperlipidemia, mixed  - EKG 12-Lead - US, RETROPERITNL ABD,  LTD - Lipid panel - TSH  4. Abnormal glucose  - EKG 12-Lead - US, RETROPERITNL ABD,  LTD - Hemoglobin A1c - Insulin, random  5. Vitamin D deficiency  - VITAMIN D 25 Hydroxy  6. Gout  - Uric acid  7. B12 deficiency  - Vitamin B12  8. Hereditary essential tremor   9. Gastroesophageal reflux disease  - CBC with Differential/Platelet  10. Benign prostatic hyperplasia with lower urinary tract symptoms  - tadalafil (CIALIS) 5 MG tablet; T ake 1 tablet  Daily  for BPH &  Prostatism.   Dispense: 90 tablet; Refill: 3  11. Prostate cancer screening  - PSA  12. Screening examination for pulmonary tuberculosis  - TB Skin Test  13. Screening for heart disease  - EKG 12-Lead  14. FHx: heart disease  - EKG 12-Lead - US, RETROPERITNL ABD,  LTD  15. Screening for AAA (aortic abdominal aneurysm)  - US, RETROPERITNL ABD,  LTD  16. Fatigue, unspecified type  - Iron, Total/Total Iron Binding Cap - Vitamin B12 - Testosterone - CBC with Differential/Platelet - TSH  17. Medication management  - Urinalysis, Routine w reflex microscopic - Microalbumin / creatinine urine ratio - CBC with Differential/Platelet - COMPLETE METABOLIC PANEL WITH GFR - Magnesium - Lipid panel - TSH - Hemoglobin A1c - Insulin, random - VITAMIN D 25 Hydroxy   18. Screening for colorectal cancer - POC Hemoccult Bld/Stl        Patient was counseled in prudent diet, weight control to achieve/maintain BMI less than 25, BP monitoring, regular exercise and medications as discussed.  Long discussion with patient re: his alcohol use recommending continued trial on complete cessation. Discussed med effects and SE's. Routine screening labs and tests as requested with regular  follow-up as recommended. Over 50 minutes of exam, counseling, chart review and high complex critical decision making was performed   Marinus MawWilliam D Alanny Rivers, MD

## 2022-04-12 NOTE — Patient Instructions (Signed)
Due to recent changes in healthcare laws, you may see the results of your imaging and laboratory studies on MyChart before your provider has had a chance to review them.  We understand that in some cases there may be results that are confusing or concerning to you. Not all laboratory results come back in the same time frame and the provider may be waiting for multiple results in order to interpret others.  Please give us 48 hours in order for your provider to thoroughly review all the results before contacting the office for clarification of your results.   ++++++++++++++++++++++++++++++++++++++  Vit D  & Vit C 1,000 mg   are recommended to help protect  against the Covid-19 and other Corona viruses.    Also it's recommended  to take  Zinc 50 mg  to help  protect against the Covid-19   and best place to get  is also on [employer]  and don't pay more than 6-8 cents /pill !  =============================== Coronavirus (COVID-19) Are you at risk?  Are you at risk for the Coronavirus (COVID-19)?  To be considered HIGH RISK for Coronavirus (COVID-19), you have to meet the following criteria:  Traveled to [place], [place], [place], [place] or [place]; or in the [place] to [place], [place], [place]  or [place]; and have fever, cough, and shortness of breath within the last 2 weeks of travel OR Been in close contact with a person diagnosed with COVID-19 within the last 2 weeks and have  fever, cough,and shortness of breath  IF YOU DO NOT MEET THESE CRITERIA, YOU ARE CONSIDERED LOW RISK FOR COVID-19.  What to do if you are HIGH RISK for COVID-19?  If you are having a medical emergency, call 911. Seek medical care right away. Before you go to a doctor's office, urgent care or emergency department,  call ahead and tell them about your recent travel, contact with someone diagnosed with COVID-19   and your symptoms.  You should receive instructions from your physician's office  regarding next steps of care.  When you arrive at healthcare provider, tell the healthcare staff immediately you have returned from  visiting [place], [place], [place], [place] or [place]; or traveled in the [place] to [place], [place],  [place] or [place] in the last two weeks or you have been in close contact with a person diagnosed with  COVID-19 in the last 2 weeks.   Tell the health care staff about your symptoms: fever, cough and shortness of breath. After you have been seen by a medical provider, you will be either: Tested for (COVID-19) and discharged home on quarantine except to seek medical care if  symptoms worsen, and asked to  Stay home and avoid contact with others until you get your results (4-5 days)  Avoid travel on public transportation if possible (such as bus, train, or airplane) or Sent to the Emergency Department by EMS for evaluation, COVID-19 testing  and  possible admission depending on your condition and test results.  What to do if you are LOW RISK for COVID-19?  Reduce your risk of any infection by using the same precautions used for avoiding the common cold or flu:  Wash your hands often with soap and warm water for at least 20 seconds.  If soap and water are not readily available,  use an alcohol-based hand sanitizer with at least 60% alcohol.  If coughing or sneezing, cover your mouth and nose by coughing   or sneezing into the elbow areas of your shirt or coat,  into a tissue or into your sleeve (not your hands). Avoid shaking hands with others and consider head nods or verbal greetings only. Avoid touching your eyes, nose, or mouth with unwashed hands.  Avoid close contact with people who are sick. Avoid places or events with large numbers of people in one location, like concerts or sporting events. Carefully consider travel plans you have or are making. If you are planning any travel outside or inside the [place], visit the CDC's Travelers' Health  webpage for the latest health notices. If you have some symptoms but not all symptoms, continue to monitor at home and seek medical attention  if your symptoms worsen. If you are having a medical emergency, call 911. >>>>>>>>>>>>>>>>>>>>>>>>>>>> Preventive Care for Adults  A healthy lifestyle and preventive care can promote health and wellness. Preventive health guidelines for men include the following key practices: A routine yearly physical is a good way to check with your health care provider about your health and preventative screening. It is a chance to share any concerns and updates on your health and to receive a thorough exam. Visit your dentist for a routine exam and preventative care every 6 months. Brush your teeth twice a day and floss once a day. Good oral hygiene prevents tooth decay and gum disease. The frequency of eye exams is based on your age, health, family medical history, use of contact lenses, and other factors. Follow your health care provider's recommendations for frequency of eye exams. Eat a healthy diet. Foods such as vegetables, fruits, whole grains, low-fat dairy products, and lean protein foods contain the nutrients you need without too many calories. Decrease your intake of foods high in solid fats, added sugars, and salt. Eat the right amount of calories for you. Get information about a proper diet from your health care provider, if necessary. Regular physical exercise is one of the most important things you can do for your health. Most adults should get at least 150 minutes of moderate-intensity exercise (any activity that increases your heart rate and causes you to sweat) each week. In addition, most adults need muscle-strengthening exercises on 2 or more days a week. Maintain a healthy weight. The body mass index (BMI) is a screening tool to identify possible weight problems. It provides an estimate of body fat based on height and weight. Your health care provider can  find your BMI and can help you achieve or maintain a healthy weight. For adults 20 years and older: A BMI below 18.5 is considered underweight. A BMI of 18.5 to 24.9 is normal. A BMI of 25 to 29.9 is considered overweight. A BMI of 30 and above is considered obese. Maintain normal blood lipids and cholesterol levels by exercising and minimizing your intake of saturated fat. Eat a balanced diet with plenty of fruit and vegetables. Blood tests for lipids and cholesterol should begin at age 20 and be repeated every 5 years. If your lipid or cholesterol levels are high, you are over 50, or you are at high risk for heart disease, you may need your cholesterol levels checked more frequently. Ongoing high lipid and cholesterol levels should be treated with medicines if diet and exercise are not working. If you smoke, find out from your health care provider how to quit. If you do not use tobacco, do not start. Lung cancer screening is recommended for adults aged 55-80 years who are at high risk for   developing lung cancer because of a history of smoking. A yearly low-dose CT scan of the lungs is recommended for people who have at least a 30-pack-year history of smoking and are a current smoker or have quit within the past 15 years. A pack year of smoking is smoking an average of 1 pack of cigarettes a day for 1 year (for example: 1 pack a day for 30 years or 2 packs a day for 15 years). Yearly screening should continue until the smoker has stopped smoking for at least 15 years. Yearly screening should be stopped for people who develop a health problem that would prevent them from having lung cancer treatment. If you choose to drink alcohol, do not have more than 2 drinks per day. One drink is considered to be 12 ounces (355 mL) of beer, 5 ounces (148 mL) of wine, or 1.5 ounces (44 mL) of liquor. Avoid use of street drugs. Do not share needles with anyone. Ask for help if you need support or instructions about  stopping the use of drugs. High blood pressure causes heart disease and increases the risk of stroke. Your blood pressure should be checked at least every 1-2 years. Ongoing high blood pressure should be treated with medicines, if weight loss and exercise are not effective. If you are 45-79 years old, ask your health care provider if you should take aspirin to prevent heart disease. Diabetes screening involves taking a blood sample to check your fasting blood sugar level. This should be done once every 3 years, after age 45, if you are within normal weight and without risk factors for diabetes. Testing should be considered at a younger age or be carried out more frequently if you are overweight and have at least 1 risk factor for diabetes. Colorectal cancer can be detected and often prevented. Most routine colorectal cancer screening begins at the age of 50 and continues through age 75. However, your health care provider may recommend screening at an earlier age if you have risk factors for colon cancer. On a yearly basis, your health care provider may provide home test kits to check for hidden blood in the stool. Use of a small camera at the end of a tube to directly examine the colon (sigmoidoscopy or colonoscopy) can detect the earliest forms of colorectal cancer. Talk to your health care provider about this at age 50, when routine screening begins. Direct exam of the colon should be repeated every 5-10 years through age 75, unless early forms of precancerous polyps or small growths are found.  Talk with your health care provider about prostate cancer screening. Testicular cancer screening isrecommended for adult males. Screening includes self-exam, a health care provider exam, and other screening tests. Consult with your health care provider about any symptoms you have or any concerns you have about testicular cancer. Use sunscreen. Apply sunscreen liberally and repeatedly throughout the day. You should  seek shade when your shadow is shorter than you. Protect yourself by wearing long sleeves, pants, a wide-brimmed hat, and sunglasses year round, whenever you are outdoors. Once a month, do a whole-body skin exam, using a mirror to look at the skin on your back. Tell your health care provider about new moles, moles that have irregular borders, moles that are larger than a pencil eraser, or moles that have changed in shape or color. Stay current with required vaccines (immunizations). Influenza vaccine. All adults should be immunized every year. Tetanus, diphtheria, and acellular pertussis (Td, Tdap) vaccine. An   adult who has not previously received Tdap or who does not know his vaccine status should receive 1 dose of Tdap. This initial dose should be followed by tetanus and diphtheria toxoids (Td) booster doses every 10 years. Adults with an unknown or incomplete history of completing a 3-dose immunization series with Td-containing vaccines should begin or complete a primary immunization series including a Tdap dose. Adults should receive a Td booster every 10 years. Varicella vaccine. An adult without evidence of immunity to varicella should receive 2 doses or a second dose if he has previously received 1 dose. Human papillomavirus (HPV) vaccine. Males aged 13-21 years who have not received the vaccine previously should receive the 3-dose series. Males aged 22-26 years may be immunized. Immunization is recommended through the age of 26 years for any male who has sex with males and did not get any or all doses earlier. Immunization is recommended for any person with an immunocompromised condition through the age of 26 years if he did not get any or all doses earlier. During the 3-dose series, the second dose should be obtained 4-8 weeks after the first dose. The third dose should be obtained 24 weeks after the first dose and 16 weeks after the second dose. Zoster vaccine. One dose is recommended for adults  aged 60 years or older unless certain conditions are present.  PREVNAR  - Pneumococcal 13-valent conjugate (PCV13) vaccine. When indicated, a person who is uncertain of his immunization history and has no record of immunization should receive the PCV13 vaccine. An adult aged 19 years or older who has certain medical conditions and has not been previously immunized should receive 1 dose of PCV13 vaccine. This PCV13 should be followed with a dose of pneumococcal polysaccharide (PPSV23) vaccine. The PPSV23 vaccine dose should be obtained at least 1 r more year(s) after the dose of PCV13 vaccine. An adult aged 19 years or older who has certain medical conditions and previously received 1 or more doses of PPSV23 vaccine should receive 1 dose of PCV13. The PCV13 vaccine dose should be obtained 1 or more years after the last PPSV23 vaccine dose.  PNEUMOVAX - Pneumococcal polysaccharide (PPSV23) vaccine. When PCV13 is also indicated, PCV13 should be obtained first. All adults aged 65 years and older should be immunized. An adult younger than age 65 years who has certain medical conditions should be immunized. Any person who resides in a nursing home or long-term care facility should be immunized. An adult smoker should be immunized. People with an immunocompromised condition and certain other conditions should receive both PCV13 and PPSV23 vaccines. People with human immunodeficiency virus (HIV) infection should be immunized as soon as possible after diagnosis. Immunization during chemotherapy or radiation therapy should be avoided. Routine use of PPSV23 vaccine is not recommended for American Indians, [place] Natives, or people younger than 65 years unless there are medical conditions that require PPSV23 vaccine. When indicated, people who have unknown immunization and have no record of immunization should receive PPSV23 vaccine. One-time revaccination 5 years after the first dose of PPSV23 is recommended for people  aged 19-64 years who have chronic kidney failure, nephrotic syndrome, asplenia, or immunocompromised conditions. People who received 1-2 doses of PPSV23 before age 65 years should receive another dose of PPSV23 vaccine at age 65 years or later if at least 5 years have passed since the previous dose. Doses of PPSV23 are not needed for people immunized with PPSV23 at or after age 65 years.  Hepatitis A vaccine.   Adults who wish to be protected from this disease, have certain high-risk conditions, work with hepatitis A-infected animals, work in hepatitis A research labs, or travel to or work in countries with a high rate of hepatitis A should be immunized. Adults who were previously unvaccinated and who anticipate close contact with an international adoptee during the first 60 days after arrival in the [place] from a country with a high rate of hepatitis A should be immunized.  Hepatitis B vaccine. Adults should be immunized if they wish to be protected from this disease, have certain high-risk conditions, may be exposed to blood or other infectious body fluids, are household contacts or sex partners of hepatitis B positive people, are clients or workers in certain care facilities, or travel to or work in countries with a high rate of hepatitis B.  Preventive Service / Frequency  Ages 40 to 64 Blood pressure check. Lipid and cholesterol check Lung cancer screening. / Every year if you are aged 55-80 years and have a 30-pack-year history of smoking and currently smoke or have quit within the past 15 years. Yearly screening is stopped once you have quit smoking for at least 15 years or develop a health problem that would prevent you from having lung cancer treatment. Fecal occult blood test (FOBT) of stool. / Every year beginning at age 50 and continuing until age 75. You may not have to do this test if you get a colonoscopy every 10 years. Flexible sigmoidoscopy** or colonoscopy.** / Every 5 years for  a flexible sigmoidoscopy or every 10 years for a colonoscopy beginning at age 50 and continuing until age 75. Screening for abdominal aortic aneurysm (AAA)  by ultrasound is recommended for people who have history of high blood pressure or who are current or former smokers. +++++++++++ Recommend Adult Low Dose Aspirin or  coated  Aspirin 81 mg daily  To reduce risk of Colon Cancer 40 %,  Skin Cancer 26 % ,  Malignant Melanoma 46%  and  Pancreatic cancer 60% ++++++++++++++++++++ Vitamin D goal  is between 70-100.  Please make sure that you are taking your Vitamin D as directed.  It is very important as a natural anti-inflammatory  helping hair, skin, and nails, as well as reducing stroke and heart attack risk.  It helps your bones and helps with mood. It also decreases numerous cancer risks so please take it as directed.  Low Vit D is associated with a 200-300% higher risk for CANCER  and 200-300% higher risk for HEART   ATTACK  &  STROKE.   ...................................... It is also associated with higher death rate at younger ages,  autoimmune diseases like Rheumatoid arthritis, Lupus, Multiple Sclerosis.    Also many other serious conditions, like depression, Alzheimer's Dementia, infertility, muscle aches, fatigue, fibromyalgia - just to name a few. +++++++++++++++++++++ Recommend the book "The END of DIETING" by Dr Joel Fuhrman  & the book "The END of DIABETES " by Dr Joel Fuhrman At [employer] - get book & Audio CD's    Being diabetic has a  300% increased risk for heart attack, stroke, cancer, and alzheimer- type vascular dementia. It is very important that you work harder with diet by avoiding all foods that are white. Avoid white rice (brown & wild rice is OK), white potatoes (sweetpotatoes in moderation is OK), White bread or wheat bread or anything made out of white flour like bagels, donuts, rolls, buns, biscuits, cakes, pastries, cookies, pizza crust, and pasta (made    from white flour & egg whites) - vegetarian pasta or spinach or wheat pasta is OK. Multigrain breads like Arnold's or [employer], or multigrain sandwich thins or flatbreads.  Diet, exercise and weight loss can reverse and cure diabetes in the early stages.  Diet, exercise and weight loss is very important in the control and prevention of complications of diabetes which affects every system in your body, ie. Brain - dementia/stroke, eyes - glaucoma/blindness, heart - heart attack/heart failure, kidneys - dialysis, stomach - gastric paralysis, intestines - malabsorption, nerves - severe painful neuritis, circulation - gangrene & loss of a leg(s), and finally cancer and Alzheimers.    I recommend avoid fried & greasy foods,  sweets/candy, white rice (brown or wild rice or Quinoa is OK), white potatoes (sweet potatoes are OK) - anything made from white flour - bagels, doughnuts, rolls, buns, biscuits,white and wheat breads, pizza crust and traditional pasta made of white flour & egg white(vegetarian pasta or spinach or wheat pasta is OK).  Multi-grain bread is OK - like multi-grain flat bread or sandwich thins. Avoid alcohol in excess. Exercise is also important.    Eat all the vegetables you want - avoid meat, especially red meat and dairy - especially cheese.  Cheese is the most concentrated form of trans-fats which is the worst thing to clog up our arteries. Veggie cheese is OK which can be found in the fresh produce section at [employer] or [employer] or [employer]  ++++++++++++++++++++++ DASH Eating Plan  DASH stands for "Dietary Approaches to Stop Hypertension."   The DASH eating plan is a healthy eating plan that has been shown to reduce high blood pressure (hypertension). Additional health benefits may include reducing the risk of type 2 diabetes mellitus, heart disease, and stroke. The DASH eating plan may also help with weight loss. WHAT DO I NEED TO KNOW ABOUT THE DASH EATING PLAN? For  the DASH eating plan, you will follow these general guidelines: Choose foods with a percent daily value for sodium of less than 5% (as listed on the food label). Use salt-free seasonings or herbs instead of table salt or sea salt. Check with your health care provider or pharmacist before using salt substitutes. Eat lower-sodium products, often labeled as "lower sodium" or "no salt added." Eat fresh foods. Eat more vegetables, fruits, and low-fat dairy products. Choose whole grains. Look for the word "whole" as the first word in the ingredient list. Choose fish  Limit sweets, desserts, sugars, and sugary drinks. Choose heart-healthy fats. Eat veggie cheese  Eat more home-cooked food and less restaurant, buffet, and fast food. Limit fried foods. Cook foods using methods other than frying. Limit canned vegetables. If you do use them, rinse them well to decrease the sodium. When eating at a restaurant, ask that your food be prepared with less salt, or no salt if possible.                      WHAT FOODS CAN I EAT? Read Dr Joel Fuhrman's books on The End of Dieting & The End of Diabetes  Grains Whole grain or whole wheat bread. Brown rice. Whole grain or whole wheat pasta. Quinoa, bulgur, and whole grain cereals. Low-sodium cereals. Corn or whole wheat flour tortillas. Whole grain cornbread. Whole grain crackers. Low-sodium crackers.  Vegetables Fresh or frozen vegetables (raw, steamed, roasted, or grilled). Low-sodium or reduced-sodium tomato and vegetable juices. Low-sodium or reduced-sodium tomato sauce and paste. Low-sodium or reduced-sodium canned vegetables.     Fruits All fresh, canned (in natural juice), or frozen fruits.  Protein Products  All fish and seafood.  Dried beans, peas, or lentils. Unsalted nuts and seeds. Unsalted canned beans.  Dairy Low-fat dairy products, such as skim or 1% milk, 2% or reduced-fat cheeses, low-fat ricotta or cottage cheese, or plain low-fat yogurt.  Low-sodium or reduced-sodium cheeses.  Fats and Oils Tub margarines without trans fats. Light or reduced-fat mayonnaise and salad dressings (reduced sodium). Avocado. Safflower, olive, or canola oils. Natural peanut or almond butter.  Other Unsalted popcorn and pretzels. The items listed above may not be a complete list of recommended foods or beverages. Contact your dietitian for more options.  +++++++++++++++++++  WHAT FOODS ARE NOT RECOMMENDED? Grains/ White flour or wheat flour White bread. White pasta. White rice. Refined cornbread. Bagels and croissants. Crackers that contain trans fat.  Vegetables  Creamed or fried vegetables. Vegetables in a . Regular canned vegetables. Regular canned tomato sauce and paste. Regular tomato and vegetable juices.  Fruits Dried fruits. Canned fruit in light or heavy syrup. Fruit juice.  Meat and Other Protein Products Meat in general - RED meat & White meat.  Fatty cuts of meat. Ribs, chicken wings, all processed meats as bacon, sausage, bologna, salami, fatback, hot dogs, bratwurst and packaged luncheon meats.  Dairy Whole or 2% milk, cream, half-and-half, and cream cheese. Whole-fat or sweetened yogurt. Full-fat cheeses or blue cheese. Non-dairy creamers and whipped toppings. Processed cheese, cheese spreads, or cheese curds.  Condiments Onion and garlic salt, seasoned salt, table salt, and sea salt. Canned and packaged gravies. Worcestershire sauce. Tartar sauce. Barbecue sauce. Teriyaki sauce. Soy sauce, including reduced sodium. Steak sauce. Fish sauce. Oyster sauce. Cocktail sauce. Horseradish. Ketchup and mustard. Meat flavorings and tenderizers. Bouillon cubes. Hot sauce. Tabasco sauce. Marinades. Taco seasonings. Relishes.  Fats and Oils Butter, stick margarine, lard, shortening and bacon fat. Coconut, palm kernel, or palm oils. Regular salad dressings.  Pickles and olives. Salted popcorn and pretzels.  The items listed above may not  be a complete list of foods and beverages to avoid.   

## 2022-04-13 LAB — LIPID PANEL
Cholesterol: 173 mg/dL (ref <200)
HDL: 44 mg/dL (ref >=40)
LDL Cholesterol (Calc): 92 mg/dL (calc)
Non-HDL Cholesterol (Calc): 129 mg/dL (calc) (ref <130)
Total CHOL/HDL Ratio: 3.9 (calc) (ref <5.0)
Triglycerides: 283 mg/dL — ABNORMAL HIGH (ref <150)

## 2022-04-13 LAB — CBC WITH DIFFERENTIAL/PLATELET
Basophils Absolute: 27 cells/uL (ref 0–200)
Eosinophils Absolute: 356 cells/uL (ref 15–500)
Eosinophils Relative: 4 %
Hemoglobin: 10.7 g/dL — ABNORMAL LOW (ref 13.2–17.1)
Lymphs Abs: 1700 cells/uL (ref 850–3900)
MCH: 35.4 pg — ABNORMAL HIGH (ref 27.0–33.0)
MCHC: 35.3 g/dL (ref 32.0–36.0)
Monocytes Relative: 9.9 %
Neutro Abs: 5936 cells/uL (ref 1500–7800)
Neutrophils Relative %: 66.7 %
Platelets: 163 10*3/uL (ref 140–400)
RBC: 3.02 10*6/uL — ABNORMAL LOW (ref 4.20–5.80)
WBC: 8.9 10*3/uL (ref 3.8–10.8)

## 2022-04-13 LAB — URINALYSIS, ROUTINE W REFLEX MICROSCOPIC
Bacteria, UA: NONE SEEN /HPF
Bilirubin Urine: NEGATIVE
Glucose, UA: NEGATIVE
Hyaline Cast: NONE SEEN /LPF
Ketones, ur: NEGATIVE
Leukocytes,Ua: NEGATIVE
Nitrite: NEGATIVE
RBC / HPF: >=60 /HPF — AB (ref 0–2)
Specific Gravity, Urine: 1.012 (ref 1.001–1.035)
Squamous Epithelial / HPF: NONE SEEN /HPF (ref <=5)
pH: 6 (ref 5.0–8.0)

## 2022-04-13 LAB — URIC ACID: Uric Acid, Serum: 10.7 mg/dL — ABNORMAL HIGH (ref 4.0–8.0)

## 2022-04-13 LAB — MICROALBUMIN / CREATININE URINE RATIO
Creatinine, Urine: 84 mg/dL (ref 20–320)
Microalb Creat Ratio: 4555 mg/g creat — ABNORMAL HIGH (ref <30)
Microalb, Ur: 382.6 mg/dL

## 2022-04-13 LAB — COMPLETE METABOLIC PANEL WITH GFR
AG Ratio: 1.3 (calc) (ref 1.0–2.5)
ALT: 54 U/L — ABNORMAL HIGH (ref 9–46)
AST: 81 U/L — ABNORMAL HIGH (ref 10–35)
Albumin: 4 g/dL (ref 3.6–5.1)
Alkaline phosphatase (APISO): 89 U/L (ref 35–144)
BUN/Creatinine Ratio: 11 (calc) (ref 6–22)
BUN: 24 mg/dL (ref 7–25)
CO2: 24 mmol/L (ref 20–32)
Calcium: 9 mg/dL (ref 8.6–10.3)
Chloride: 106 mmol/L (ref 98–110)
Creat: 2.13 mg/dL — ABNORMAL HIGH (ref 0.70–1.30)
Globulin: 3.1 g/dL (calc) (ref 1.9–3.7)
Glucose, Bld: 101 mg/dL — ABNORMAL HIGH (ref 65–99)
Potassium: 4.5 mmol/L (ref 3.5–5.3)
Sodium: 142 mmol/L (ref 135–146)
Total Bilirubin: 0.9 mg/dL (ref 0.2–1.2)
Total Protein: 7.1 g/dL (ref 6.1–8.1)
eGFR: 36 mL/min/{1.73_m2} — ABNORMAL LOW (ref >=60)

## 2022-04-13 LAB — TSH: TSH: 2.12 mIU/L (ref 0.40–4.50)

## 2022-04-13 LAB — HEMOGLOBIN A1C
Hgb A1c MFr Bld: 5.6 % of total Hgb (ref <5.7)
Mean Plasma Glucose: 114 mg/dL
eAG (mmol/L): 6.3 mmol/L

## 2022-04-13 LAB — MAGNESIUM: Magnesium: 1.6 mg/dL (ref 1.5–2.5)

## 2022-04-13 LAB — INSULIN, RANDOM: Insulin: 43.6 u[IU]/mL — ABNORMAL HIGH

## 2022-04-13 LAB — TESTOSTERONE: Testosterone: 293 ng/dL (ref 250–827)

## 2022-04-13 LAB — IRON, TOTAL/TOTAL IRON BINDING CAP
%SAT: 41 % (calc) (ref 20–48)
Iron: 130 ug/dL (ref 50–180)
TIBC: 314 mcg/dL (calc) (ref 250–425)

## 2022-04-13 LAB — VITAMIN B12: Vitamin B-12: 357 pg/mL (ref 200–1100)

## 2022-04-13 LAB — PSA: PSA: 3.29 ng/mL (ref <=4.00)

## 2022-04-13 LAB — VITAMIN D 25 HYDROXY (VIT D DEFICIENCY, FRACTURES): Vit D, 25-Hydroxy: 75 ng/mL (ref 30–100)

## 2022-04-15 NOTE — Progress Notes (Signed)
^<^<^<^<^<^<^<^<^<^<^<^<^<^<^<^<^<^<^<^<^<^<^<^<^<^<^<^<^<^<^<^<^<^<^<^<^ ^>^>^>^>^>^>^>^>^>^>^>>^>^>^>^>^>^>^>^>^>^>^>^>^>^>^>^>^>^>^>^>^>^>^>^>^ -Test results slightly outside the reference range are not unusual. If there is anything important, I will review this with you,  otherwise it is considered normal test values.  If you have further questions,  please do not hesitate to contact me at the office or via My Chart.  ^<^<^<^<^<^<^<^<^<^<^<^<^<^<^<^<^<^<^<^<^<^<^<^<^<^<^<^<^<^<^<^<^<^<^<^<^ ^>^>^>^>^>^>^>^>^>^>^>^>^>^>^>^>^>^>^>^>^>^>^>^>^>^>^>^>^>^>^>^>^>^>^>^>^  -  U / A  - Still loaded with Red Blood Cells   - Very Important that you get an appointment to see a urologist   PLEASE   Let me know if you have the name of a local urologist that                                                                                you'd like to see closer to your home   & you can call or please let us know to try to expedite an appointment   ^<^<^<^<^<^<^<^<^<^<^<^<^<^<^<^<^<^<^<^<^<^<^<^<^<^<^<^<^<^<^<^<^<^<^<^<^ ^>^>^>^>^>^>^>^>^>^>^>^>^>^>^>^>^>^>^>^>^>^>^>^>^>^>^>^>^>^>^>^>^>^>^>^>^  -  U/A is loaded with protein  !                                             but it's probably from all of the Red cells in your Urine  ! ^<^<^<^<^<^<^<^<^<^<^<^<^<^<^<^<^<^<^<^<^<^<^<^<^<^<^<^<^<^<^<^<^<^<^<^<^ ^>^>^>^>^>^>^>^>^>^>^>^>^>^>^>^>^>^>^>^>^>^>^>^>^>^>^>^>^>^>^>^>^>^>^>^>^  -  Uric Acid / Gout test is VERY HIGH RISK for GOUT Attack  !   -   Be sure taking your Allopurinol & AVOID ALCOHOL &  Red  Meat  ! ^<^<^<^<^<^<^<^<^<^<^<^<^<^<^<^<^<^<^<^<^<^<^<^<^<^<^<^<^<^<^<^<^<^<^<^<^ ^>^>^>^>^>^>^>^>^>^>^>^>^>^>^>^>^>^>^>^>^>^>^>^>^>^>^>^>^>^>^>^>^>^>^>^>^  -  CBC shows your Hgb or Red Cell count  has dropped down Dramatically to a    Severe Anemia 10.7 gm% -  This could be due to Bleeding or severe Vitamin Deficiency   - Fortunately your iron levels were Normal , but   -   Your Vitamin B12 =    357  is   Very Low  (Ideal or Goal Vit B12 is between 450 - 1,100)   Low Vit B12 may be associated with     Anemia,                   Fatigue,   Peripheral Neuropathy, Dementia, "Brain Fog", & Depression  - Recommend take a sub-lingual form of Vitamin B12 tablet   1,000 to 5,000 mcg tab that you dissolve under your tongue /Daily   - Can get Lavonia Dana - best price at [employer] or on [employer]   ^<^<^<^<^<^<^<^<^<^<^<^<^<^<^<^<^<^<^<^<^<^<^<^<^<^<^<^<^<^<^<^<^<^<^<^<^ ^>^>^>^>^>^>^>^>^>^>^>^>^>^>^>^>^>^>^>^>^>^>^>^>^>^>^>^>^>^>^>^>^>^>^>^>^  -  You will need CBC  & Vit B12 level  rechecked in about a month   ^<^<^<^<^<^<^<^<^<^<^<^<^<^<^<^<^<^<^<^<^<^<^<^<^<^<^<^<^<^<^<^<^<^<^<^<^ ^>^>^>^>^>^>^>^>^>^>^>^>^>^>^>^>^>^>^>^>^>^>^>^>^>^>^>^>^>^>^>^>^>^>^>^>^  - Liver enzymes are still elevated            - 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   - Important to stop drinking Alcohol  !   ^<^<^<^<^<^<^<^<^<^<^<^<^<^<^<^<^<^<^<^<^<^<^<^<^<^<^<^<^<^<^<^<^<^<^<^<^ ^>^>^>^>^>^>^>^>^>^>^>^>^>^>^>^>^>^>^>^>^>^>^>^>^>^>^>^>^>^>^>^>^>^>^>^>^  -   Magnesium  -  1.6    -   is still very very  low- goal is betw 2.0 - 2.5,   - So..............Marland Kitchen  Recommend that you take  Magnesium 500 mg tablet 3 x /day with Meals   - also important to eat lots of  leafy green vegetables   - spinach - Kale - collards - greens - okra - asparagus  - broccoli - quinoa - squash - almonds   - black, red, white beans  -  peas - green beans ^<^<^<^<^<^<^<^<^<^<^<^<^<^<^<^<^<^<^<^<^<^<^<^<^<^<^<^<^<^<^<^<^<^<^<^<^ ^>^>^>^>^>^>^>^>^>^>^>^>^>^>^>^>^>^>^>^>^>^>^>^>^>^>^>^>^>^>^>^>^>^>^>^>^  -  A1c is back to Normal - No Diabetes   -   Great !  ^<^<^<^<^<^<^<^<^<^<^<^<^<^<^<^<^<^<^<^<^<^<^<^<^<^<^<^<^<^<^<^<^<^<^<^<^ ^>^>^>^>^>^>^>^>^>^>^>^>^>^>^>^>^>^>^>^>^>^>^>^>^>^>^>^>^>^>^>^>^>^>^>^>^  -  Vitamin D = 75 - Excellent   - Please keep dosage  same  ^<^<^<^<^<^<^<^<^<^<^<^<^<^<^<^<^<^<^<^<^<^<^<^<^<^<^<^<^<^<^<^<^<^<^<^<^ ^>^>^>^>^>^>^>^>^>^>^>^>^>^>^>^>^>^>^>^>^>^>^>^>^>^>^>^>^>^>^>^>^>^>^>^>^  -  Total Chol = 173   &   LDL Chol = 92  -   Both Great   - But Triglycerides (   283     ) or fats in blood are too high                 (   Ideal or  Goal is less than 150  !  )    - Recommend avoid fried & greasy foods,  sweets / candy,   - Avoid white rice  (brown or wild rice or Quinoa is OK),   - Avoid white potatoes  (sweet potatoes are OK)   - Avoid anything made from white flour  - bagels, doughnuts, rolls, buns, biscuits, white and   wheat breads, pizza crust and traditional  pasta made of white flour & egg white  - (vegetarian pasta or spinach or wheat pasta is OK).    - Multi-grain bread is OK - like multi-grain flat bread or  sandwich thins.   - Avoid alcohol in excess.   - Exercise is also important. ^<^<^<^<^<^<^<^<^<^<^<^<^<^<^<^<^<^<^<^<^<^<^<^<^<^<^<^<^<^<^<^<^<^<^<^<^ ^>^>^>^>^>^>^>^>^>^>^>^>^>^>^>^>^>^>^>^>^>^>^>^>^>^>^>^>^>^>^>^>^>^>^>^>^  -  All Else - Kidneys - Electrolytes -  & Thyroid    - all  Normal / OK ^<^<^<^<^<^<^<^<^<^<^<^<^<^<^<^<^<^<^<^<^<^<^<^<^<^<^<^<^<^<^<^<^<^<^<^<^ ^>^>^>^>^>^>^>^>^>^>^>^>^>^>^>^>^>^>^>^>^>^>^>^>^>^>^>^>^>^>^>^>^>^>^>^>^

## 2022-04-19 ENCOUNTER — Other Ambulatory Visit: Payer: Self-pay | Admitting: Internal Medicine

## 2022-04-19 DIAGNOSIS — M1A9XX0 Chronic gout, unspecified, without tophus (tophi): Secondary | ICD-10-CM

## 2022-04-22 ENCOUNTER — Other Ambulatory Visit: Payer: Self-pay | Admitting: Nurse Practitioner

## 2022-04-22 DIAGNOSIS — I10 Essential (primary) hypertension: Secondary | ICD-10-CM

## 2022-05-15 ENCOUNTER — Other Ambulatory Visit: Payer: Self-pay | Admitting: Nurse Practitioner

## 2022-05-15 DIAGNOSIS — M1A9XX0 Chronic gout, unspecified, without tophus (tophi): Secondary | ICD-10-CM

## 2022-05-18 ENCOUNTER — Other Ambulatory Visit: Payer: Self-pay | Admitting: Internal Medicine

## 2022-05-18 DIAGNOSIS — M109 Gout, unspecified: Secondary | ICD-10-CM

## 2022-05-18 MED ORDER — DEXAMETHASONE 4 MG PO TABS
ORAL_TABLET | ORAL | 1 refills | Status: DC
Start: 2022-05-18 — End: 2022-10-25

## 2022-06-13 ENCOUNTER — Other Ambulatory Visit: Payer: Self-pay | Admitting: Internal Medicine

## 2022-06-13 DIAGNOSIS — I10 Essential (primary) hypertension: Secondary | ICD-10-CM

## 2022-06-13 DIAGNOSIS — R609 Edema, unspecified: Secondary | ICD-10-CM

## 2022-06-29 ENCOUNTER — Other Ambulatory Visit: Payer: Self-pay | Admitting: Internal Medicine

## 2022-06-29 DIAGNOSIS — I10 Essential (primary) hypertension: Secondary | ICD-10-CM

## 2022-06-29 DIAGNOSIS — G250 Essential tremor: Secondary | ICD-10-CM

## 2022-06-29 MED ORDER — NADOLOL 80 MG PO TABS: ORAL_TABLET | ORAL | 3 refills | Status: DC

## 2022-08-09 ENCOUNTER — Other Ambulatory Visit: Payer: Self-pay | Admitting: Nurse Practitioner

## 2022-08-09 DIAGNOSIS — M1A9XX0 Chronic gout, unspecified, without tophus (tophi): Secondary | ICD-10-CM

## 2022-09-29 ENCOUNTER — Other Ambulatory Visit: Payer: Self-pay | Admitting: Internal Medicine

## 2022-09-29 DIAGNOSIS — I10 Essential (primary) hypertension: Secondary | ICD-10-CM

## 2022-09-29 DIAGNOSIS — G250 Essential tremor: Secondary | ICD-10-CM

## 2022-10-16 ENCOUNTER — Other Ambulatory Visit: Payer: Self-pay | Admitting: Internal Medicine

## 2022-10-16 DIAGNOSIS — R609 Edema, unspecified: Secondary | ICD-10-CM

## 2022-10-16 DIAGNOSIS — I10 Essential (primary) hypertension: Secondary | ICD-10-CM

## 2022-10-20 ENCOUNTER — Other Ambulatory Visit: Payer: Self-pay | Admitting: Nurse Practitioner

## 2022-10-20 DIAGNOSIS — I10 Essential (primary) hypertension: Secondary | ICD-10-CM

## 2022-10-24 NOTE — Progress Notes (Signed)
Stephenie Acres                                                                                                                                                                                                                                                                Future Appointments  Date Time Provider Department  10/25/2022                         6 mo ov  9:30 AM Lucky Cowboy, MD [facility]  04/18/2023                           cpe  3:00 PM Lucky Cowboy, MD [facility]   History of Present Illness:       This very nice 57 y.o. DWM with  HTN, HLD, Prediabetes,  familial /hereditary tremor, Gout, alcoholism and Vitamin D Deficiency  presents for 6 month follow up. Patient had an elevated PSA 4.47 in March 2022.        Patient is treated for HTN  (2006)  & BP has been controlled at home. Today's                   . Patient has had no complaints of any cardiac type chest pain, palpitations, dyspnea Pollyann Kennedy /PND, dizziness, claudication or dependent edema.        Hyperlipidemia is controlled with diet & Atorvastatin. Patient denies myalgias or other med SE's. Last Lipids were ,at goal except elevated Trig's :  Lab Results  Component Value Date   CHOL 173 04/12/2022   HDL 44 04/12/2022   LDLCALC 92 04/12/2022   TRIG 283 (H) 04/12/2022   CHOLHDL 3.9 04/12/2022     Also, the patient has been monitored expectantly for glucose intolerance  (A1c 5.9% /Apr 2024)  and has had no symptoms of reactive hypoglycemia, diabetic polys, paresthesias or visual blurring.  Last A1c was normal & at goal :  Lab Results  Component Value Date   HGBA1C 5.6 04/12/2022        Patient has hx/o Vitamin B12 Deficiency with  last value low at 357 on 04/12/22.                                                          Further, the patient also has history of Vitamin D Deficiency  ("25" /2008)  and supplements vitamin D . Last vitamin D was at goal :  Lab Results  Component Value Date   VD25OH 65 04/12/2022     Current Outpatient Medications on File Prior to Visit  Medication Sig   acyclovir (ZOVIRAX) 800 MG tablet TAKE 1 TABLET DAILY FOR FEVER BLISTERS AS NEEDED   allopurinol (ZYLOPRIM) 300 MG tablet TAKE 1 TABLET DAILY TO PREVENT GOUT   Ascorbic Acid (VITAMIN C ADULT GUMMIES PO) Take by mouth daily. 1 gummy daily   atorvastatin (LIPITOR) 80 MG tablet TAKE 1 TABLET BY MOUTH EVERY DAY FOR CHOLESTEROL   buPROPion (WELLBUTRIN XL) 300 MG 24 hr tablet TAKE 1 TABLET DAILY FOR MOOD, FOCUS & CONCENTRATION.   Cholecalciferol (VITAMIN D3) 400 units CAPS Take 5,000 Units by mouth daily.    dexamethasone (DECADRON) 4 MG tablet Take 1 tab 3 x day - 3 days, then 2 x day - 3 days, then 1 tab daily   diazepam (VALIUM) 5 MG tablet Take 1 tablet  3 x /day for Tremor   escitalopram (LEXAPRO) 20 MG tablet Take 1 tablet Daily for Mood   loratadine-pseudoephedrine (CLARITIN-D 24 HOUR) 10-240 MG 24 hr tablet Take 1 tablet by mouth daily. (Patient taking differently: Take 1 tablet by mouth as needed.)   MAGNESIUM PO Take 1 tablet by mouth daily.   naltrexone (DEPADE) 50 MG tablet Take  1 tablet  4 x /day  at Meals & Bedtime for "cravings"   NIFEdipine (PROCARDIA XL) 30 MG 24 hr tablet Take  1 tablet   2 x/day (every 12 hours) for BP   olmesartan (BENICAR) 40 MG tablet TAKE 1/2 TO 1 TABLET AT BEDTIME FOR BLOOD PRESSURE   omeprazole (PRILOSEC) 40 MG capsule TAKE 1 CAPSULE DAILY TO PREVENT HEARTBURN & INDIGESTION   potassium chloride SA (KLOR-CON M20) 20 MEQ tablet TAKE 1 TABLET 3 TIMES A DAY WITH MEALS FOR POTASSIUM (Dx:  e87.6)   propranolol (INDERAL) 10 MG tablet Take 1 tablet (10 mg total) by mouth 2 (two) times daily.   tadalafil  (CIALIS) 5 MG tablet Take 1 tablet  Daily  for BPH &  Prostatism.   torsemide (DEMADEX) 20 MG tablet TAKE 1 TABLET EVERY MORNING IF NEEDED FOR FLUID RETENTION / ANKLE SWELLING   zinc gluconate 50 MG tablet Take 50 mg by mouth daily.      Allergies  Allergen Reactions   Citalopram     dyphoria     PMHx:   Past Medical History:  Diagnosis Date   Anxiety    Barrett's esophagus 06/30/2018   Encounter for long-term (current) use of other medications    Hx of adenomatous colonic polyps 06/30/2018   Hyperlipidemia    Hypertension    Hypogonadism male    Vitamin D deficiency      Immunization History  Administered Date(s) Administered   Influenza Inj Mdck Quad 09/30/2017   Influenza-Unspecified 10/07/2012   PFIZERSARS-COV-2 Vacc 04/13/2019, 05/04/2019   PPD Test 01/12/2014, 08/01/2015, 09/10/2016, 03/29/2020, 04/11/2021, 04/12/2022   Pneumococcal-23 02/11/2008   Td  12/05/2005   Tdap 08/01/2015      Past Surgical History:  Procedure Laterality Date   HERNIA REPAIR     I & D EXTREMITY Right 06/19/2012   Procedure: IRRIGATION AND DEBRIDEMENT FOREARM;  Surgeon: Tami Ribas, MD;  Location: [facility];  Service: Orthopedics;  Laterality: Right;   KNEE ARTHROSCOPY WITH ANTERIOR CRUCIATE LIGAMENT (ACL) REPAIR Left Jan 2017   Dr. Corinna Capra   LASIK  2006   VASECTOMY       FHx:    Reviewed / unchanged   SHx:    Reviewed / unchanged    Systems Review:  Constitutional: Denies fever, chills, wt changes, headaches, insomnia, fatigue, night sweats, change in appetite. Eyes: Denies redness, blurred vision, diplopia, discharge, itchy, watery eyes.  ENT: Denies discharge, congestion, post nasal drip, epistaxis, sore throat, earache, hearing loss, dental pain, tinnitus, vertigo, sinus pain, snoring.  CV: Denies chest pain, palpitations, irregular heartbeat, syncope, dyspnea, diaphoresis, orthopnea, PND, claudication or edema. Respiratory: denies cough, dyspnea, DOE, pleurisy, hoarseness,  laryngitis, wheezing.  Gastrointestinal: Denies dysphagia, odynophagia, heartburn, reflux, water brash, abdominal pain or cramps, nausea, vomiting, bloating, diarrhea, constipation, hematemesis, melena, hematochezia  or hemorrhoids. Genitourinary: Denies dysuria, frequency, urgency, nocturia, hesitancy, discharge, hematuria or flank pain. Musculoskeletal: Denies arthralgias, myalgias, stiffness, jt. swelling, pain, limping or strain/sprain.  Skin: Denies pruritus, rash, hives, warts, acne, eczema or change in skin lesion(s). Neuro: No weakness, tremor, incoordination, spasms, paresthesia or pain. Psychiatric: Denies confusion, memory loss or sensory loss. Endo: Denies change in weight, skin or hair change.  Heme/Lymph: No excessive bleeding, bruising or enlarged lymph nodes.   Physical Exam  There were no vitals taken for this visit.  Appears  well nourished, well groomed  and in no distress.  Eyes: PERRLA, EOMs, conjunctiva no swelling or erythema. Sinuses: No frontal/maxillary tenderness ENT/Mouth: EAC's clear, TM's nl w/o erythema, bulging. Nares clear w/o erythema, swelling, exudates. Oropharynx clear without erythema or exudates. Oral hygiene is good. Tongue normal, non obstructing. Hearing intact.  Neck: Supple. Thyroid not palpable. Car 2+/2+ without bruits, nodes or JVD. Chest: Respirations nl with BS clear & equal w/o rales, rhonchi, wheezing or stridor.  Cor: Heart sounds normal w/ regular rate and rhythm without sig. murmurs, gallops, clicks or rubs. Peripheral pulses normal and equal  without edema.  Abdomen: Soft & bowel sounds normal. Non-tender w/o guarding, rebound, hernias, masses or organomegaly.  Lymphatics: Unremarkable.  Musculoskeletal: Full ROM all peripheral extremities, joint stability, 5/5 strength and normal gait.  Skin: Warm, dry without exposed rashes, lesions or ecchymosis apparent.  Neuro: Cranial nerves intact, reflexes equal bilaterally. Sensory-motor  testing grossly intact. Tendon reflexes grossly intact.  Pysch: Alert & oriented x 3.  Insight and judgement nl & appropriate. No ideations.   Assessment and Plan:   1. Essential hypertension  - Continue medication, monitor blood pressure at home.  - Continue DASH diet.  Reminder to go to the ER if any CP,  SOB, nausea, dizziness, severe HA, changes vision/speech.    - CBC with Differential/Platelet - COMPLETE METABOLIC PANEL WITH GFR - Magnesium - TSH   2. Hyperlipidemia, mixed  - Continue diet/meds, exercise,& lifestyle modifications.  - Continue monitor periodic cholesterol/liver & renal functions     - Lipid panel - TSH   3. Abnormal glucose  - Continue diet, exercise  - Lifestyle modifications.  - Monitor appropriate labs    - Hemoglobin A1c - Insulin, random   4. Vitamin D deficiency  - Continue supplementation.   -  VITAMIN D 25 Hydroxy   5. B12 deficiency  - Vitamin B12   6. Elevated PSA  - PSA, total and free   7. Hereditary essential tremor   8. Gout  - Uric acid   9. Medication management - PSA, total and free - CBC with Differential/Platelet - COMPLETE METABOLIC PANEL WITH GFR - Magnesium - Lipid panel - TSH - Hemoglobin A1c - Insulin, random - VITAMIN D 25 Hydroxy  - Uric acid - Vitamin B12          Discussed  regular exercise, BP monitoring, weight control to achieve/maintain BMI less than 25 and discussed med and SE's. Recommended labs to assess /monitor clinical status .  I discussed the assessment and treatment plan with the patient. The patient was provided an opportunity to ask questions and all were answered. The patient agreed with the plan and demonstrated an understanding of the instructions.  I provided over 30 minutes of exam, counseling, chart review and  complex critical decision making.        The patient was advised to call back or seek an in-person evaluation if the symptoms worsen or if the condition fails  to improve as anticipated.   Marinus Maw, MD

## 2022-10-24 NOTE — Progress Notes (Incomplete)
Future Appointments  Date Time Provider Department  10/25/2022                         6 mo ov  9:30 AM Lucky Cowboy, MD [facility]  04/18/2023                           cpe  3:00 PM Lucky Cowboy, MD [facility]   History of Present Illness:       This very nice 57 y.o. DWM with  HTN, HLD, Prediabetes,  familial /hereditary tremor, Gout, alcoholism and Vitamin D Deficiency  presents for 6 month follow up. Patient had an elevated PSA 4.47 in March 2022.        Patient is treated for HTN  (2006)  & BP has been controlled at home. Today's                   . Patient has had no complaints of any cardiac type chest pain, palpitations, dyspnea Pollyann Kennedy /PND, dizziness, claudication or dependent edema.        Hyperlipidemia is controlled with diet & Atorvastatin. Patient denies myalgias or other med SE's. Last Lipids were ,at goal except elevated Trig's :  Lab Results  Component Value Date   CHOL 173 04/12/2022   HDL 44 04/12/2022   LDLCALC 92 04/12/2022   TRIG 283 (H) 04/12/2022   CHOLHDL 3.9 04/12/2022     Also, the patient has been monitored expectantly for glucose intolerance  (A1c 5.9% /Apr 2024)  and has had no symptoms of reactive hypoglycemia, diabetic polys, paresthesias or visual blurring.  Last A1c was normal & at goal :  Lab Results  Component Value Date   HGBA1C 5.6 04/12/2022        Patient has hx/o Vitamin B12 Deficiency with last value low at 357 on 04/12/22.                                                          Further, the patient also has history of Vitamin D Deficiency  ("25" /2008)  and supplements vitamin D . Last vitamin D was at goal :  Lab Results  Component Value Date   VD25OH 46 04/12/2022     Current Outpatient Medications on File Prior to Visit  Medication Sig  . acyclovir (ZOVIRAX) 800 MG tablet TAKE 1 TABLET DAILY FOR FEVER BLISTERS AS NEEDED  . allopurinol (ZYLOPRIM) 300 MG tablet TAKE 1 TABLET DAILY TO PREVENT GOUT  .  Ascorbic Acid (VITAMIN C ADULT GUMMIES PO) Take by mouth daily. 1 gummy daily  . atorvastatin (LIPITOR) 80 MG tablet TAKE 1 TABLET BY MOUTH EVERY DAY FOR CHOLESTEROL  . buPROPion (WELLBUTRIN XL) 300 MG 24 hr tablet TAKE 1 TABLET DAILY FOR MOOD, FOCUS & CONCENTRATION.  Marland Kitchen Cholecalciferol (VITAMIN D3) 400 units CAPS Take 5,000 Units by mouth daily.   Marland Kitchen dexamethasone (DECADRON) 4 MG tablet Take 1 tab 3 x day - 3 days, then 2 x day - 3 days, then 1 tab daily  . diazepam (VALIUM) 5 MG tablet Take 1 tablet  3 x /day for Tremor  . escitalopram (LEXAPRO) 20 MG tablet Take 1 tablet Daily for Mood  . loratadine-pseudoephedrine (CLARITIN-D 24  HOUR) 10-240 MG 24 hr tablet Take 1 tablet by mouth daily. (Patient taking differently: Take 1 tablet by mouth as needed.)  . MAGNESIUM PO Take 1 tablet by mouth daily.  . naltrexone (DEPADE) 50 MG tablet Take  1 tablet  4 x /day  at Meals & Bedtime for "cravings"  . NIFEdipine (PROCARDIA XL) 30 MG 24 hr tablet Take  1 tablet   2 x/day (every 12 hours) for BP  . olmesartan (BENICAR) 40 MG tablet TAKE 1/2 TO 1 TABLET AT BEDTIME FOR BLOOD PRESSURE  . omeprazole (PRILOSEC) 40 MG capsule TAKE 1 CAPSULE DAILY TO PREVENT HEARTBURN & INDIGESTION  . potassium chloride SA (KLOR-CON M20) 20 MEQ tablet TAKE 1 TABLET 3 TIMES A DAY WITH MEALS FOR POTASSIUM (Dx:  e87.6)  . propranolol (INDERAL) 10 MG tablet Take 1 tablet (10 mg total) by mouth 2 (two) times daily.  . tadalafil (CIALIS) 5 MG tablet Take 1 tablet  Daily  for BPH &  Prostatism.  . torsemide (DEMADEX) 20 MG tablet TAKE 1 TABLET EVERY MORNING IF NEEDED FOR FLUID RETENTION / ANKLE SWELLING  . zinc gluconate 50 MG tablet Take 50 mg by mouth daily.      Allergies  Allergen Reactions  . Citalopram     dyphoria     PMHx:   Past Medical History:  Diagnosis Date  . Anxiety   . Barrett's esophagus 06/30/2018  . Encounter for long-term (current) use of other medications   . Hx of adenomatous colonic polyps 06/30/2018   . Hyperlipidemia   . Hypertension   . Hypogonadism male   . Vitamin D deficiency      Immunization History  Administered Date(s) Administered  . Influenza Inj Mdck Quad 09/30/2017  . Influenza-Unspecified 10/07/2012  . PFIZERSARS-COV-2 Vacc 04/13/2019, 05/04/2019  . PPD Test 01/12/2014, 08/01/2015, 09/10/2016, 03/29/2020, 04/11/2021, 04/12/2022  . Pneumococcal-23 02/11/2008  . Td 12/05/2005  . Tdap 08/01/2015      Past Surgical History:  Procedure Laterality Date  . HERNIA REPAIR    . I & D EXTREMITY Right 06/19/2012   Procedure: IRRIGATION AND DEBRIDEMENT FOREARM;  Surgeon: Tami Ribas, MD;  Location: [facility] OR;  Service: Orthopedics;  Laterality: Right;  . KNEE ARTHROSCOPY WITH ANTERIOR CRUCIATE LIGAMENT (ACL) REPAIR Left Jan 2017   Dr. Corinna Capra  . LASIK  2006  . VASECTOMY       FHx:    Reviewed / unchanged   SHx:    Reviewed / unchanged    Systems Review:  Constitutional: Denies fever, chills, wt changes, headaches, insomnia, fatigue, night sweats, change in appetite. Eyes: Denies redness, blurred vision, diplopia, discharge, itchy, watery eyes.  ENT: Denies discharge, congestion, post nasal drip, epistaxis, sore throat, earache, hearing loss, dental pain, tinnitus, vertigo, sinus pain, snoring.  CV: Denies chest pain, palpitations, irregular heartbeat, syncope, dyspnea, diaphoresis, orthopnea, PND, claudication or edema. Respiratory: denies cough, dyspnea, DOE, pleurisy, hoarseness, laryngitis, wheezing.  Gastrointestinal: Denies dysphagia, odynophagia, heartburn, reflux, water brash, abdominal pain or cramps, nausea, vomiting, bloating, diarrhea, constipation, hematemesis, melena, hematochezia  or hemorrhoids. Genitourinary: Denies dysuria, frequency, urgency, nocturia, hesitancy, discharge, hematuria or flank pain. Musculoskeletal: Denies arthralgias, myalgias, stiffness, jt. swelling, pain, limping or strain/sprain.  Skin: Denies pruritus, rash, hives, warts, acne,  eczema or change in skin lesion(s). Neuro: No weakness, tremor, incoordination, spasms, paresthesia or pain. Psychiatric: Denies confusion, memory loss or sensory loss. Endo: Denies change in weight, skin or hair change.  Heme/Lymph: No excessive bleeding, bruising or enlarged lymph  nodes.   Physical Exam  There were no vitals taken for this visit.  Appears  well nourished, well groomed  and in no distress.  Eyes: PERRLA, EOMs, conjunctiva no swelling or erythema. Sinuses: No frontal/maxillary tenderness ENT/Mouth: EAC's clear, TM's nl w/o erythema, bulging. Nares clear w/o erythema, swelling, exudates. Oropharynx clear without erythema or exudates. Oral hygiene is good. Tongue normal, non obstructing. Hearing intact.  Neck: Supple. Thyroid not palpable. Car 2+/2+ without bruits, nodes or JVD. Chest: Respirations nl with BS clear & equal w/o rales, rhonchi, wheezing or stridor.  Cor: Heart sounds normal w/ regular rate and rhythm without sig. murmurs, gallops, clicks or rubs. Peripheral pulses normal and equal  without edema.  Abdomen: Soft & bowel sounds normal. Non-tender w/o guarding, rebound, hernias, masses or organomegaly.  Lymphatics: Unremarkable.  Musculoskeletal: Full ROM all peripheral extremities, joint stability, 5/5 strength and normal gait.  Skin: Warm, dry without exposed rashes, lesions or ecchymosis apparent.  Neuro: Cranial nerves intact, reflexes equal bilaterally. Sensory-motor testing grossly intact. Tendon reflexes grossly intact.  Pysch: Alert & oriented x 3.  Insight and judgement nl & appropriate. No ideations.   Assessment and Plan:  - Continue medication, monitor blood pressure at home.  - Continue DASH diet.  Reminder to go to the ER if any CP,  SOB, nausea, dizziness, severe HA, changes vision/speech.  - Continue diet/meds, exercise,& lifestyle modifications.  - Continue monitor periodic cholesterol/liver & renal functions    1. Essential  hypertension  - CBC with Differential/Platelet - COMPLETE METABOLIC PANEL WITH GFR - Magnesium - TSH   2. Hyperlipidemia, mixed  - Lipid panel - TSH   3. Abnormal glucose  - Continue diet, exercise  - Lifestyle modifications.  - Monitor appropriate labs    - Hemoglobin A1c - Insulin, random   4. Vitamin D deficiency  - Continue supplementation.   - VITAMIN D 25 Hydroxy   5. B12 deficiency  - Vitamin B12   6. Elevated PSA  - PSA, total and free   7. Hereditary essential tremor   8. Gout  - Uric acid   9. Medication management - PSA, total and free - CBC with Differential/Platelet - COMPLETE METABOLIC PANEL WITH GFR - Magnesium - Lipid panel - TSH - Hemoglobin A1c - Insulin, random - VITAMIN D 25 Hydroxy  - Uric acid - Vitamin B12          Discussed  regular exercise, BP monitoring, weight control to achieve/maintain BMI less than 25 and discussed med and SE's. Recommended labs to assess /monitor clinical status .  I discussed the assessment and treatment plan with the patient. The patient was provided an opportunity to ask questions and all were answered. The patient agreed with the plan and demonstrated an understanding of the instructions.  I provided over 30 minutes of exam, counseling, chart review and  complex critical decision making.        The patient was advised to call back or seek an in-person evaluation if the symptoms worsen or if the condition fails to improve as anticipated.   Marinus Maw, MD

## 2022-10-25 ENCOUNTER — Ambulatory Visit: Payer: BLUE CROSS/BLUE SHIELD | Admitting: Internal Medicine

## 2022-10-25 ENCOUNTER — Encounter: Payer: Self-pay | Admitting: Internal Medicine

## 2022-10-25 DIAGNOSIS — G250 Essential tremor: Secondary | ICD-10-CM

## 2022-10-25 DIAGNOSIS — E538 Deficiency of other specified B group vitamins: Secondary | ICD-10-CM

## 2022-10-25 DIAGNOSIS — E559 Vitamin D deficiency, unspecified: Secondary | ICD-10-CM

## 2022-10-25 DIAGNOSIS — Z79899 Other long term (current) drug therapy: Secondary | ICD-10-CM

## 2022-10-25 DIAGNOSIS — I10 Essential (primary) hypertension: Secondary | ICD-10-CM

## 2022-10-25 DIAGNOSIS — R972 Elevated prostate specific antigen [PSA]: Secondary | ICD-10-CM

## 2022-10-25 DIAGNOSIS — E782 Mixed hyperlipidemia: Secondary | ICD-10-CM

## 2022-10-25 DIAGNOSIS — R7309 Other abnormal glucose: Secondary | ICD-10-CM

## 2022-10-25 DIAGNOSIS — M109 Gout, unspecified: Secondary | ICD-10-CM

## 2022-10-27 ENCOUNTER — Other Ambulatory Visit: Payer: Self-pay | Admitting: Internal Medicine

## 2022-10-27 DIAGNOSIS — I10 Essential (primary) hypertension: Secondary | ICD-10-CM

## 2022-10-27 DIAGNOSIS — G250 Essential tremor: Secondary | ICD-10-CM

## 2022-11-12 ENCOUNTER — Ambulatory Visit: Payer: BLUE CROSS/BLUE SHIELD | Admitting: Internal Medicine

## 2022-11-19 ENCOUNTER — Ambulatory Visit: Payer: BLUE CROSS/BLUE SHIELD | Admitting: Internal Medicine

## 2022-11-28 ENCOUNTER — Other Ambulatory Visit: Payer: Self-pay | Admitting: Internal Medicine

## 2022-11-28 ENCOUNTER — Encounter: Payer: Self-pay | Admitting: Internal Medicine

## 2022-11-28 ENCOUNTER — Ambulatory Visit: Payer: BLUE CROSS/BLUE SHIELD | Admitting: Internal Medicine

## 2022-11-28 VITALS — BP 164/110 | HR 91 | Temp 97.9°F | Ht 71.0 in | Wt 198.6 lb

## 2022-11-28 DIAGNOSIS — E782 Mixed hyperlipidemia: Secondary | ICD-10-CM

## 2022-11-28 DIAGNOSIS — Z79899 Other long term (current) drug therapy: Secondary | ICD-10-CM

## 2022-11-28 DIAGNOSIS — I10 Essential (primary) hypertension: Secondary | ICD-10-CM | POA: Diagnosis not present

## 2022-11-28 DIAGNOSIS — R7309 Other abnormal glucose: Secondary | ICD-10-CM | POA: Diagnosis not present

## 2022-11-28 DIAGNOSIS — E559 Vitamin D deficiency, unspecified: Secondary | ICD-10-CM | POA: Diagnosis not present

## 2022-11-28 DIAGNOSIS — E538 Deficiency of other specified B group vitamins: Secondary | ICD-10-CM | POA: Diagnosis not present

## 2022-11-28 DIAGNOSIS — M109 Gout, unspecified: Secondary | ICD-10-CM | POA: Diagnosis not present

## 2022-11-28 DIAGNOSIS — R972 Elevated prostate specific antigen [PSA]: Secondary | ICD-10-CM | POA: Diagnosis not present

## 2022-11-28 DIAGNOSIS — Z23 Encounter for immunization: Secondary | ICD-10-CM

## 2022-11-28 DIAGNOSIS — G250 Essential tremor: Secondary | ICD-10-CM

## 2022-11-28 MED ORDER — NADOLOL 40 MG PO TABS: ORAL_TABLET | ORAL | 1 refills | Status: DC

## 2022-11-28 NOTE — Progress Notes (Signed)
[place]     ADULT & ADOLESCENT     INTERNAL MEDICINE  Lucky Cowboy, M.D.          Rance Muir, A.NP        Adela Glimpse, F.NP  [facility] [address]  [place], [place]. [address] Telephone [phone] Telefax [phone]   Future Appointments  Date Time Provider Department  11/28/2022                        6 mo  11:30 AM Lucky Cowboy, MD [facility]  04/18/2023                          cpe  3:00 PM Lucky Cowboy, MD [facility]    History of Present Illness:      This very nice 57 y.o. DWM with  HTN, HLD, Prediabetes,  familial /hereditary tremor, Gout, alcoholism and Vitamin D Deficiency  presents for 6 month follow up. Patient had an elevated PSA 4.47 in March 2022.         Patient is treated for HTN  (2006)  & BP have not  been controlled at home. He reports home BPs usually range about 160-170 /100-110 . He did see a urologist re: elevated PSA and was referred to a nephrologist who did what sounds like a 24 hour urine collection & also added HCTZ with his Olmesartan. Today's BP is elevated  at 164/110  . Patient has had no complaints of any cardiac type chest pain, palpitations, dyspnea Pollyann Kennedy /PND, dizziness, claudication or dependent edema.        Hyperlipidemia is controlled with diet & Atorvastatin. Patient denies myalgias or other med SE's. Last Lipids were at goal except elevated Trig's :  Lab Results  Component Value Date   CHOL 173 04/12/2022   HDL 44 04/12/2022   LDLCALC 92 04/12/2022   TRIG 283 (H) 04/12/2022   CHOLHDL 3.9 04/12/2022     Also, the patient has been monitored expectantly for glucose intolerance  (A1c 5.9% /Apr 2024)  and has had no symptoms of reactive hypoglycemia, diabetic polys, paresthesias or visual blurring.  Last A1c was normal & at goal :  Lab Results  Component Value Date   HGBA1C 5.6 04/12/2022        Patient has hx/o Vitamin B12 Deficiency with last value low at 357 on 04/12/22.                                                           Further, the patient also has history of Vitamin D Deficiency  ("25" /2008)  and supplements vitamin D . Last vitamin D was at goal :  Lab Results  Component Value Date   VD25OH 44 04/12/2022     Current Outpatient Medications on File Prior to Visit  Medication Sig   acyclovir 800 MG tablet TAKE 1 TABLET DAILY FOR FEVER BLISTERS AS NEEDED   allopurinol  300 MG tablet TAKE 1 TABLET DAILY    VITAMIN C ADULT GUMMIES  1 gummy daily   atorvastatin  80 MG tablet TAKE 1 TABLET ]EVERY DAY     VITAMIN D  5,000 u  Take  daily.    diazepam  5 MG tablet  Take 1 tablet  3 x /day for Tremor    loratadine-pse 10-240 MG 24 hr  Take 1 tablet daily  as needed   MAGNESIUM  Take 1 tablet daily.    NIFEdipine XL 30 MG 24 hr tablet Take  1 tablet   2 x/day (every 12 hours) for BP   olmesartan 40 MG tablet TAKE 1/2 TO 1 TABLET AT BEDTIME    omeprazole 40 MG capsule TAKE 1 CAPSULE DAILY    potassium chloride  20 MEQ tablet TAKE 1 TABLET 3 TIMES A DAY    propranolol  10 MG tablet Take 1 tablet  2 times daily.   tadalafil  5 MG tablet Take 1 tablet  Daily     torsemide 20 MG tablet TAKE 1 TABLET EVERY MORNING IF NEEDED FOR FLUID RETENTION / ANKLE SWELLING     Allergies  Allergen Reactions   Citalopram     dyphoria     PMHx:   Past Medical History:  Diagnosis Date   Anxiety    Barrett's esophagus 06/30/2018   Encounter for long-term (current) use of other medications    Hx of adenomatous colonic polyps 06/30/2018   Hyperlipidemia    Hypertension    Hypogonadism male    Vitamin D deficiency      Immunization History  Administered Date(s) Administered   Influenza Inj Mdck Quad 09/30/2017   Influenza- 10/07/2012   PFIZERSARS-COV-2 Vacc 04/13/2019, 05/04/2019   PPD Test 03/29/2020, 04/11/2021, 04/12/2022   Pneumococcal - 23 02/11/2008   Td 12/05/2005   Tdap 08/01/2015      Past Surgical History:  Procedure Laterality Date    HERNIA REPAIR     I & D EXTREMITY Right 06/19/2012   Procedure: IRRIGATION AND DEBRIDEMENT FOREARM;  Surgeon: Tami Ribas, MD;  Location: [facility] OR;  Service: Orthopedics;  Laterality: Right;   KNEE ARTHROSCOPY WITH ANTERIOR CRUCIATE LIGAMENT (ACL) REPAIR Left Jan 2017   Dr. Corinna Capra   LASIK  2006   VASECTOMY       FHx:    Reviewed / unchanged   SHx:    Reviewed / unchanged    Systems Review:  Constitutional: Denies fever, chills, wt changes, headaches, insomnia, fatigue, night sweats, change in appetite. Eyes: Denies redness, blurred vision, diplopia, discharge, itchy, watery eyes.  ENT: Denies discharge, congestion, post nasal drip, epistaxis, sore throat, earache, hearing loss, dental pain, tinnitus, vertigo, sinus pain, snoring.  CV: Denies chest pain, palpitations, irregular heartbeat, syncope, dyspnea, diaphoresis, orthopnea, PND, claudication or edema. Respiratory: denies cough, dyspnea, DOE, pleurisy, hoarseness, laryngitis, wheezing.  Gastrointestinal: Denies dysphagia, odynophagia, heartburn, reflux, water brash, abdominal pain or cramps, nausea, vomiting, bloating, diarrhea, constipation, hematemesis, melena, hematochezia  or hemorrhoids. Genitourinary: Denies dysuria, frequency, urgency, nocturia, hesitancy, discharge, hematuria or flank pain. Musculoskeletal: Denies arthralgias, myalgias, stiffness, jt. swelling, pain, limping or strain/sprain.  Skin: Denies pruritus, rash, hives, warts, acne, eczema or change in skin lesion(s). Neuro: No weakness, tremor, incoordination, spasms, paresthesia or pain. Psychiatric: Denies confusion, memory loss or sensory loss. Endo: Denies change in weight, skin or hair change.  Heme/Lymph: No excessive bleeding, bruising or enlarged lymph nodes.   Physical Exam  BP (!) 164/110   Pulse 91   Temp 97.9 F (36.6 C)   Ht 5\' 11"  (1.803 m)   Wt 198 lb 9.6 oz (90.1 kg)   SpO2 99%   BMI 27.70 kg/m   Appears  well nourished, well groomed  and  in no distress.  Eyes: PERRLA, EOMs,  conjunctiva no swelling or erythema. Sinuses: No frontal/maxillary tenderness ENT/Mouth: EAC's clear, TM's nl w/o erythema, bulging. Nares clear w/o erythema, swelling, exudates. Oropharynx clear without erythema or exudates. Oral hygiene is good. Tongue normal, non obstructing. Hearing intact.  Neck: Supple. Thyroid not palpable. Car 2+/2+ without bruits, nodes or JVD. Chest: Respirations nl with BS clear & equal w/o rales, rhonchi, wheezing or stridor.  Cor: Heart sounds normal w/ regular rate and rhythm without sig. murmurs, gallops, clicks or rubs. Peripheral pulses normal and equal  without edema.  Abdomen: Soft & bowel sounds normal. Non-tender w/o guarding, rebound, hernias, masses or organomegaly.  Lymphatics: Unremarkable.  Musculoskeletal: Full ROM all peripheral extremities, joint stability, 5/5 strength and normal gait.  Skin: Warm, dry without exposed rashes, lesions or ecchymosis apparent.  Neuro: Cranial nerves intact, reflexes equal bilaterally. Sensory-motor testing grossly intact. Tendon reflexes grossly intact.  Pysch: Alert & oriented x 3.  Insight and judgement nl & appropriate. No ideations.   Assessment and Plan:   1. Essential hypertension  - Patient was advised to go to ER for elevated BP & & declined repeatedly.  New Rx Corgard (Nadolol) 40 mg qam for BP   & monitor to call if  BP remains elevated   - Continue medication, monitor blood pressure at home.  - Continue DASH diet.  Reminder to go to the ER if any CP,  SOB, nausea, dizziness, severe HA, changes vision/speech.    - CBC with Differential/Platelet - COMPLETE METABOLIC PANEL WITH GFR - Magnesium - TSH   2. Hyperlipidemia, mixed  - Continue diet/meds, exercise,& lifestyle modifications.  - Continue monitor periodic cholesterol/liver & renal functions     - Lipid panel - TSH   3. Abnormal glucose  - Continue diet, exercise  - Lifestyle modifications.   - Monitor appropriate labs    - Hemoglobin A1c - Insulin, random   4. Vitamin D deficiency  - Continue supplementation.   - VITAMIN D 25 Hydroxy   5. B12 deficiency  - Vitamin B12   6. Elevated PSA  - PSA, total and free   7. Hereditary essential tremor   8. Gout  - Uric acid   9. Medication management - PSA, total and free - CBC with Differential/Platelet - COMPLETE METABOLIC PANEL WITH GFR - Magnesium - Lipid panel - TSH - Hemoglobin A1c - Insulin, random - VITAMIN D 25 Hydroxy  - Uric acid - Vitamin B12          Discussed  regular exercise, BP monitoring, weight control to achieve/maintain BMI less than 25 and discussed med and SE's. Recommended labs to assess /monitor clinical status .  I discussed the assessment and treatment plan with the patient.  Patient was strongly advised to Restart Naltrexone & Stop drinking Alcohol. The patient was provided an opportunity to ask questions and all were answered. The patient agreed with the plan and demonstrated an understanding of the instructions.  I provided over 30 minutes of exam, counseling, chart review and  complex critical decision making.        The patient was advised to call back or seek an in-person evaluation if the symptoms worsen or if the condition fails to improve as anticipated.   Marinus Maw, MD

## 2022-11-28 NOTE — Progress Notes (Incomplete)
[place]     ADULT & ADOLESCENT     INTERNAL MEDICINE  Lucky Cowboy, M.D.          Rance Muir, A.NP        Adela Glimpse, F.NP  [facility] [address]  [place], [place]. [address] Telephone [phone] Telefax [phone]   Future Appointments  Date Time Provider Department  11/28/2022 11:30 AM Lucky Cowboy, MD [facility]  04/18/2023  3:00 PM Lucky Cowboy, MD [facility]    History of Present Illness:      This very nice 57 y.o. DWM with  HTN, HLD, Prediabetes,  familial /hereditary tremor, Gout, alcoholism and Vitamin D Deficiency  presents for 6 month follow up. Patient had an elevated PSA 4.47 in March 2022.         Patient is treated for HTN  (2006)  & BP has been controlled at home. Today's                   . Patient has had no complaints of any cardiac type chest pain, palpitations, dyspnea Pollyann Kennedy /PND, dizziness, claudication or dependent edema.        Hyperlipidemia is controlled with diet & Atorvastatin. Patient denies myalgias or other med SE's. Last Lipids were at goal except elevated Trig's :  Lab Results  Component Value Date   CHOL 173 04/12/2022   HDL 44 04/12/2022   LDLCALC 92 04/12/2022   TRIG 283 (H) 04/12/2022   CHOLHDL 3.9 04/12/2022     Also, the patient has been monitored expectantly for glucose intolerance  (A1c 5.9% /Apr 2024)  and has had no symptoms of reactive hypoglycemia, diabetic polys, paresthesias or visual blurring.  Last A1c was normal & at goal :  Lab Results  Component Value Date   HGBA1C 5.6 04/12/2022        Patient has hx/o Vitamin B12 Deficiency with last value low at 357 on 04/12/22.                                                          Further, the patient also has history of Vitamin D Deficiency  ("25" /2008)  and supplements vitamin D . Last vitamin D was at goal :  Lab Results  Component Value Date   VD25OH 44 04/12/2022     Current Outpatient Medications on  File Prior to Visit  Medication Sig  . acyclovir (ZOVIRAX) 800 MG tablet TAKE 1 TABLET DAILY FOR FEVER BLISTERS AS NEEDED  . allopurinol (ZYLOPRIM) 300 MG tablet TAKE 1 TABLET DAILY TO PREVENT GOUT  . Ascorbic Acid (VITAMIN C ADULT GUMMIES PO) Take by mouth daily. 1 gummy daily  . atorvastatin (LIPITOR) 80 MG tablet TAKE 1 TABLET BY MOUTH EVERY DAY FOR CHOLESTEROL  . buPROPion (WELLBUTRIN XL) 300 MG 24 hr tablet TAKE 1 TABLET DAILY FOR MOOD, FOCUS & CONCENTRATION.  Marland Kitchen Cholecalciferol (VITAMIN D3) 400 units CAPS Take 5,000 Units by mouth daily.   Marland Kitchen dexamethasone (DECADRON) 4 MG tablet Take 1 tab 3 x day - 3 days, then 2 x day - 3 days, then 1 tab daily  . diazepam (VALIUM) 5 MG tablet Take 1 tablet  3 x /day for Tremor  . escitalopram (LEXAPRO) 20 MG tablet Take 1 tablet Daily for Mood  .  loratadine-pseudoephedrine (CLARITIN-D 24 HOUR) 10-240 MG 24 hr tablet Take 1 tablet by mouth daily. (Patient taking differently: Take 1 tablet by mouth as needed.)  . MAGNESIUM PO Take 1 tablet by mouth daily.  . naltrexone (DEPADE) 50 MG tablet Take  1 tablet  4 x /day  at Meals & Bedtime for "cravings"  . NIFEdipine (PROCARDIA XL) 30 MG 24 hr tablet Take  1 tablet   2 x/day (every 12 hours) for BP  . olmesartan (BENICAR) 40 MG tablet TAKE 1/2 TO 1 TABLET AT BEDTIME FOR BLOOD PRESSURE  . omeprazole (PRILOSEC) 40 MG capsule TAKE 1 CAPSULE DAILY TO PREVENT HEARTBURN & INDIGESTION  . potassium chloride SA (KLOR-CON M20) 20 MEQ tablet TAKE 1 TABLET 3 TIMES A DAY WITH MEALS FOR POTASSIUM (Dx:  e87.6)  . propranolol (INDERAL) 10 MG tablet Take 1 tablet (10 mg total) by mouth 2 (two) times daily.  . tadalafil (CIALIS) 5 MG tablet Take 1 tablet  Daily  for BPH &  Prostatism.  . torsemide (DEMADEX) 20 MG tablet TAKE 1 TABLET EVERY MORNING IF NEEDED FOR FLUID RETENTION / ANKLE SWELLING  . zinc gluconate 50 MG tablet Take 50 mg by mouth daily.      Allergies  Allergen Reactions  . Citalopram     dyphoria      PMHx:   Past Medical History:  Diagnosis Date  . Anxiety   . Barrett's esophagus 06/30/2018  . Encounter for long-term (current) use of other medications   . Hx of adenomatous colonic polyps 06/30/2018  . Hyperlipidemia   . Hypertension   . Hypogonadism male   . Vitamin D deficiency      Immunization History  Administered Date(s) Administered  . Influenza Inj Mdck Quad 09/30/2017  . Influenza-Unspecified 10/07/2012  . PFIZERSARS-COV-2 Vacc 04/13/2019, 05/04/2019  . PPD Test 01/12/2014, 08/01/2015, 09/10/2016, 03/29/2020, 04/11/2021, 04/12/2022  . Pneumococcal-23 02/11/2008  . Td 12/05/2005  . Tdap 08/01/2015      Past Surgical History:  Procedure Laterality Date  . HERNIA REPAIR    . I & D EXTREMITY Right 06/19/2012   Procedure: IRRIGATION AND DEBRIDEMENT FOREARM;  Surgeon: Tami Ribas, MD;  Location: [facility] OR;  Service: Orthopedics;  Laterality: Right;  . KNEE ARTHROSCOPY WITH ANTERIOR CRUCIATE LIGAMENT (ACL) REPAIR Left Jan 2017   Dr. Corinna Capra  . LASIK  2006  . VASECTOMY       FHx:    Reviewed / unchanged   SHx:    Reviewed / unchanged    Systems Review:  Constitutional: Denies fever, chills, wt changes, headaches, insomnia, fatigue, night sweats, change in appetite. Eyes: Denies redness, blurred vision, diplopia, discharge, itchy, watery eyes.  ENT: Denies discharge, congestion, post nasal drip, epistaxis, sore throat, earache, hearing loss, dental pain, tinnitus, vertigo, sinus pain, snoring.  CV: Denies chest pain, palpitations, irregular heartbeat, syncope, dyspnea, diaphoresis, orthopnea, PND, claudication or edema. Respiratory: denies cough, dyspnea, DOE, pleurisy, hoarseness, laryngitis, wheezing.  Gastrointestinal: Denies dysphagia, odynophagia, heartburn, reflux, water brash, abdominal pain or cramps, nausea, vomiting, bloating, diarrhea, constipation, hematemesis, melena, hematochezia  or hemorrhoids. Genitourinary: Denies dysuria, frequency, urgency,  nocturia, hesitancy, discharge, hematuria or flank pain. Musculoskeletal: Denies arthralgias, myalgias, stiffness, jt. swelling, pain, limping or strain/sprain.  Skin: Denies pruritus, rash, hives, warts, acne, eczema or change in skin lesion(s). Neuro: No weakness, tremor, incoordination, spasms, paresthesia or pain. Psychiatric: Denies confusion, memory loss or sensory loss. Endo: Denies change in weight, skin or hair change.  Heme/Lymph: No excessive bleeding, bruising  or enlarged lymph nodes.   Physical Exam  There were no vitals taken for this visit.  Appears  well nourished, well groomed  and in no distress.  Eyes: PERRLA, EOMs, conjunctiva no swelling or erythema. Sinuses: No frontal/maxillary tenderness ENT/Mouth: EAC's clear, TM's nl w/o erythema, bulging. Nares clear w/o erythema, swelling, exudates. Oropharynx clear without erythema or exudates. Oral hygiene is good. Tongue normal, non obstructing. Hearing intact.  Neck: Supple. Thyroid not palpable. Car 2+/2+ without bruits, nodes or JVD. Chest: Respirations nl with BS clear & equal w/o rales, rhonchi, wheezing or stridor.  Cor: Heart sounds normal w/ regular rate and rhythm without sig. murmurs, gallops, clicks or rubs. Peripheral pulses normal and equal  without edema.  Abdomen: Soft & bowel sounds normal. Non-tender w/o guarding, rebound, hernias, masses or organomegaly.  Lymphatics: Unremarkable.  Musculoskeletal: Full ROM all peripheral extremities, joint stability, 5/5 strength and normal gait.  Skin: Warm, dry without exposed rashes, lesions or ecchymosis apparent.  Neuro: Cranial nerves intact, reflexes equal bilaterally. Sensory-motor testing grossly intact. Tendon reflexes grossly intact.  Pysch: Alert & oriented x 3.  Insight and judgement nl & appropriate. No ideations.   Assessment and Plan:   1. Essential hypertension  - Continue medication, monitor blood pressure at home.  - Continue DASH diet.  Reminder  to go to the ER if any CP,  SOB, nausea, dizziness, severe HA, changes vision/speech.    - CBC with Differential/Platelet - COMPLETE METABOLIC PANEL WITH GFR - Magnesium - TSH   2. Hyperlipidemia, mixed  - Continue diet/meds, exercise,& lifestyle modifications.  - Continue monitor periodic cholesterol/liver & renal functions     - Lipid panel - TSH   3. Abnormal glucose  - Continue diet, exercise  - Lifestyle modifications.  - Monitor appropriate labs    - Hemoglobin A1c - Insulin, random   4. Vitamin D deficiency  - Continue supplementation.   - VITAMIN D 25 Hydroxy   5. B12 deficiency  - Vitamin B12   6. Elevated PSA  - PSA, total and free   7. Hereditary essential tremor   8. Gout  - Uric acid   9. Medication management - PSA, total and free - CBC with Differential/Platelet - COMPLETE METABOLIC PANEL WITH GFR - Magnesium - Lipid panel - TSH - Hemoglobin A1c - Insulin, random - VITAMIN D 25 Hydroxy  - Uric acid - Vitamin B12          Discussed  regular exercise, BP monitoring, weight control to achieve/maintain BMI less than 25 and discussed med and SE's. Recommended labs to assess /monitor clinical status .  I discussed the assessment and treatment plan with the patient. The patient was provided an opportunity to ask questions and all were answered. The patient agreed with the plan and demonstrated an understanding of the instructions.  I provided over 30 minutes of exam, counseling, chart review and  complex critical decision making.        The patient was advised to call back or seek an in-person evaluation if the symptoms worsen or if the condition fails to improve as anticipated.   Marinus Maw, MD

## 2022-11-28 NOTE — Patient Instructions (Signed)

## 2022-11-29 NOTE — Progress Notes (Signed)
[][][][][][][][][][][][][][][][][][][][][][][][][][][][][][][][][][][][][][][][][]][][][][][][][][][][][][][][][][][][][][][][][[][][][][] [][][][][][][][][][][][][][][][][][][][][][][][][][][][][][][][][][][][][][][][][]][][][][][][][][][][][][][][][][][][][][][][][[][][][][] -  Test results slightly outside the reference range are not unusual. If there is anything important, I will review this with you,  otherwise it is considered normal test values.  If you have further questions,  please do not hesitate to contact me at the office or via My Chart.  [] [] [] [] [] [] [] [] [] [] [] [] [] [] [] [] [] [] [] [] [] [] [] [] [] [] [] [] [] [] [] [] [] [] [] [] [] [] [] [] [] ][] [] [] [] [] [] [] [] [] [] [] [] [] [] [] [] [] [] [] [] [] [] [[] [] [] [] []  [] [] [] [] [] [] [] [] [] [] [] [] [] [] [] [] [] [] [] [] [] [] [] [] [] [] [] [] [] [] [] [] [] [] [] [] [] [] [] [] [] ][] [] [] [] [] [] [] [] [] [] [] [] [] [] [] [] [] [] [] [] [] [] [[] [] [] [] []   -  Severe Anemia persists - Probably due to Progressive worsening of Kidneys failing  [] [] [] [] [] [] [] [] [] [] [] [] [] [] [] [] [] [] [] [] [] [] [] [] [] [] [] [] [] [] [] [] [] [] [] [] [] [] [] [] [] ][] [] [] [] [] [] [] [] [] [] [] [] [] [] [] [] [] [] [] [] [] [] [[] [] [] [] []   -  Kidney functions are Rapidly worsening,                                                      they are in low Stage 4 and almost in Stage 5    - It is imperative when you return back home to                                                                      [facility] on Friday or Monday                    That you contact your Nephrologist /( Kidney Specialist)   And let him know that your Creatinine has risen from 2.13  to now 3.69   And   Your Kidney functions ( GFR)  have dropped over the last year from                       87     to    45    to      36     &      now down to 19  !  Also, Your Potassium ( K+) is elevated at 5.4  and   you absolutely  must avoid any dietary sources of   Potassium or salt substitutes As "Light Salt " or "No Salt"  - He likely may hospitalize you to run further tests to                   your  Kidneys                                        to prevent or delay Dialysis treatments !  [] [] [] [] [] [] [] [] [] [] [] [] [] [] [] [] [] [] [] [] [] [] [] [] [] [] [] [] [] [] [] [] [] [] [] [] [] [] [] [] [] ][] [] [] [] [] [] [] [] [] [] [] [] [] [] [] [] [] [] [] [] [] [] [[] [] [] [] []   Cholesterol = 166, fortunately is in a good range -   [] [] [] [] [] [] [] [] [] [] [] [] [] [] [] [] [] [] [] [] [] [] [] [] [] [] [] [] [] [] [] [] [] [] [] [] [] [] [] [] [] ][] [] [] [] [] [] [] [] [] [] [] [] [] [] [] [] [] [] [] [] [] [] [[] [] [] [] []   -  A1c is Normal - No Diabetes - Great  !  [] [] [] [] [] [] [] [] [] [] [] [] [] [] [] [] [] [] [] [] [] [] [] [] [] [] [] [] [] [] [] [] [] [] [] [] [] [] [] [] [] ][] [] [] [] [] [] [] [] [] [] [] [] [] [] [] [] [] [] [] [] [] [] [[] [] [] [] []   -  Vitamin D = 63 - Great range - Please continue dosage same  [] [] [] [] [] [] [] [] [] [] [] [] [] [] [] [] [] [] [] [] [] [] [] [] [] [] [] [] [] [] [] [] [] [] [] [] [] [] [] [] [] ][] [] [] [] [] [] [] [] [] [] [] [] [] [] [] [] [] [] [] [] [] [] [[] [] [] [] []   -  Vitamin B12 is in a good range  [] [] [] [] [] [] [] [] [] [] [] [] [] [] [] [] [] [] [] [] [] [] [] [] [] [] [] [] [] [] [] [] [] [] [] [] [] [] [] [] [] ][] [] [] [] [] [] [] [] [] [] [] [] [] [] [] [] [] [] [] [] [] [] [[] [] [] [] []   -  Free / total PSA  still Pending  [] [] [] [] [] [] [] [] [] [] [] [] [] [] [] [] [] [] [] [] [] [] [] [] [] [] [] [] [] [] [] [] [] [] [] [] [] [] [] [] [] ][] [] [] [] [] [] [] [] [] [] [] [] [] [] [] [] [] [] [] [] [] [] [[] [] [] [] []   -          -

## 2022-12-03 LAB — LIPID PANEL
Cholesterol: 166 mg/dL (ref <200)
HDL: 64 mg/dL (ref >=40)
LDL Cholesterol (Calc): 75 mg/dL
Non-HDL Cholesterol (Calc): 102 mg/dL (ref <130)
Total CHOL/HDL Ratio: 2.6 (calc) (ref <5.0)
Triglycerides: 176 mg/dL — ABNORMAL HIGH (ref <150)

## 2022-12-03 LAB — CBC WITH DIFFERENTIAL/PLATELET
Absolute Lymphocytes: 1664 {cells}/uL (ref 850–3900)
Absolute Monocytes: 837 {cells}/uL (ref 200–950)
Basophils Absolute: 53 {cells}/uL (ref 0–200)
Basophils Relative: 0.5 %
Eosinophils Absolute: 276 {cells}/uL (ref 15–500)
Eosinophils Relative: 2.6 %
HCT: 30.7 % — ABNORMAL LOW (ref 38.5–50.0)
Hemoglobin: 10 g/dL — ABNORMAL LOW (ref 13.2–17.1)
MCH: 33.1 pg — ABNORMAL HIGH (ref 27.0–33.0)
MCHC: 32.6 g/dL (ref 32.0–36.0)
MCV: 101.7 fL — ABNORMAL HIGH (ref 80.0–100.0)
MPV: 10.3 fL (ref 7.5–12.5)
Monocytes Relative: 7.9 %
Neutro Abs: 7770 {cells}/uL (ref 1500–7800)
Neutrophils Relative %: 73.3 %
Platelets: 226 10*3/uL (ref 140–400)
RBC: 3.02 10*6/uL — ABNORMAL LOW (ref 4.20–5.80)
RDW: 13.9 % (ref 11.0–15.0)
Total Lymphocyte: 15.7 %
WBC: 10.6 10*3/uL (ref 3.8–10.8)

## 2022-12-03 LAB — URIC ACID: Uric Acid, Serum: 2.7 mg/dL — ABNORMAL LOW (ref 4.0–8.0)

## 2022-12-03 LAB — COMPLETE METABOLIC PANEL WITH GFR
AG Ratio: 1.3 (calc) (ref 1.0–2.5)
ALT: 23 U/L (ref 9–46)
AST: 37 U/L — ABNORMAL HIGH (ref 10–35)
Albumin: 4 g/dL (ref 3.6–5.1)
Alkaline phosphatase (APISO): 162 U/L — ABNORMAL HIGH (ref 35–144)
BUN/Creatinine Ratio: 11 (calc) (ref 6–22)
BUN: 40 mg/dL — ABNORMAL HIGH (ref 7–25)
CO2: 21 mmol/L (ref 20–32)
Calcium: 9 mg/dL (ref 8.6–10.3)
Chloride: 108 mmol/L (ref 98–110)
Creat: 3.69 mg/dL — ABNORMAL HIGH (ref 0.70–1.30)
Globulin: 3.1 g/dL (ref 1.9–3.7)
Glucose, Bld: 98 mg/dL (ref 65–99)
Potassium: 5.4 mmol/L — ABNORMAL HIGH (ref 3.5–5.3)
Sodium: 140 mmol/L (ref 135–146)
Total Bilirubin: 0.6 mg/dL (ref 0.2–1.2)
Total Protein: 7.1 g/dL (ref 6.1–8.1)
eGFR: 19 mL/min/{1.73_m2} — ABNORMAL LOW (ref >=60)

## 2022-12-03 LAB — VITAMIN B12: Vitamin B-12: 556 pg/mL (ref 200–1100)

## 2022-12-03 LAB — PSA, TOTAL AND FREE
PSA, % Free: 20 % — ABNORMAL LOW (ref >25)
PSA, Free: 0.9 ng/mL
PSA, Total: 4.6 ng/mL — ABNORMAL HIGH (ref <=4.0)

## 2022-12-03 LAB — HEMOGLOBIN A1C
Hgb A1c MFr Bld: 5.4 %{Hb} (ref <5.7)
Mean Plasma Glucose: 108 mg/dL
eAG (mmol/L): 6 mmol/L

## 2022-12-03 LAB — TSH: TSH: 2.13 m[IU]/L (ref 0.40–4.50)

## 2022-12-03 LAB — MAGNESIUM: Magnesium: 1.3 mg/dL — ABNORMAL LOW (ref 1.5–2.5)

## 2022-12-03 LAB — VITAMIN D 25 HYDROXY (VIT D DEFICIENCY, FRACTURES): Vit D, 25-Hydroxy: 63 ng/mL (ref 30–100)

## 2022-12-03 LAB — INSULIN, RANDOM: Insulin: 7.9 u[IU]/mL

## 2022-12-03 NOTE — Progress Notes (Signed)
[][][][][][][][][][][][][][][][][][][][][][][][][][][][][][][][][][][][][][][][][]][][][][][][][][][][][][][][][][][][][][][][][[][][][][]   -   PSA is slightly elevated & % free is low   - Recommend that you share these results with your Urologist  [] [] [] [] [] [] [] [] [] [] [] [] [] [] [] [] [] [] [] [] [] [] [] [] [] [] [] [] [] [] [] [] [] [] [] [] [] [] [] [] [] ][] [] [] [] [] [] [] [] [] [] [] [] [] [] [] [] [] [] [] [] [] [] [[] [] [] [] []

## 2022-12-19 ENCOUNTER — Encounter: Payer: Self-pay | Admitting: Internal Medicine

## 2022-12-21 ENCOUNTER — Encounter: Payer: Self-pay | Admitting: Internal Medicine

## 2023-01-15 ENCOUNTER — Other Ambulatory Visit: Payer: Self-pay | Admitting: Internal Medicine

## 2023-01-15 MED ORDER — NADOLOL 40 MG PO TABS: ORAL_TABLET | ORAL | 3 refills | Status: DC

## 2023-01-17 ENCOUNTER — Other Ambulatory Visit: Payer: Self-pay | Admitting: Internal Medicine

## 2023-01-17 MED ORDER — NADOLOL 40 MG PO TABS: ORAL_TABLET | ORAL | 3 refills | Status: AC

## 2023-01-22 ENCOUNTER — Other Ambulatory Visit: Payer: Self-pay | Admitting: Internal Medicine

## 2023-01-22 DIAGNOSIS — G250 Essential tremor: Secondary | ICD-10-CM

## 2023-01-22 DIAGNOSIS — I10 Essential (primary) hypertension: Secondary | ICD-10-CM

## 2023-01-28 ENCOUNTER — Encounter: Payer: Self-pay | Admitting: Nurse Practitioner

## 2023-04-18 ENCOUNTER — Encounter: Payer: BLUE CROSS/BLUE SHIELD | Admitting: Internal Medicine

## 2023-10-06 ENCOUNTER — Other Ambulatory Visit: Payer: Self-pay | Admitting: Nurse Practitioner
